# Patient Record
Sex: Female | Born: 1955 | Race: White | Hispanic: No | Smoking: Never smoker
Health system: Southern US, Community
[De-identification: ages and names within clinical notes are randomized; demographics above are authoritative.]

## PROBLEM LIST (undated history)

## (undated) DIAGNOSIS — C20 Malignant neoplasm of rectum: Secondary | ICD-10-CM

## (undated) DIAGNOSIS — K56609 Unspecified intestinal obstruction, unspecified as to partial versus complete obstruction: Secondary | ICD-10-CM

## (undated) DIAGNOSIS — G709 Myoneural disorder, unspecified: Secondary | ICD-10-CM

## (undated) DIAGNOSIS — T7840XA Allergy, unspecified, initial encounter: Secondary | ICD-10-CM

## (undated) DIAGNOSIS — K219 Gastro-esophageal reflux disease without esophagitis: Secondary | ICD-10-CM

## (undated) HISTORY — PX: TUBAL LIGATION: SHX77

## (undated) HISTORY — DX: Allergy, unspecified, initial encounter: T78.40XA

---

## 2014-11-09 ENCOUNTER — Ambulatory Visit (INDEPENDENT_AMBULATORY_CARE_PROVIDER_SITE_OTHER): Payer: BLUE CROSS/BLUE SHIELD | Admitting: Family Medicine

## 2014-11-09 VITALS — BP 142/80 | HR 67 | Temp 98.1°F | Resp 18 | Ht 64.0 in | Wt 169.0 lb

## 2014-11-09 DIAGNOSIS — M545 Low back pain, unspecified: Secondary | ICD-10-CM

## 2014-11-09 LAB — POC MICROSCOPIC URINALYSIS (UMFC): MUCUS RE: ABSENT

## 2014-11-09 LAB — POCT URINALYSIS DIP (MANUAL ENTRY)
BILIRUBIN UA: NEGATIVE
Glucose, UA: NEGATIVE
Ketones, POC UA: NEGATIVE
NITRITE UA: NEGATIVE
PH UA: 5
Protein Ur, POC: NEGATIVE
SPEC GRAV UA: 1.015
UROBILINOGEN UA: 0.2

## 2014-11-09 MED ORDER — PREDNISONE 20 MG PO TABS: ORAL_TABLET | ORAL | Status: DC

## 2014-11-09 NOTE — Patient Instructions (Signed)
The urine is normal.  You have a sprained back with some nerve root irritation.  Gentle range of motion, heat should help.  Usually this resolves in a month.  Ibuprofen before long car travel is a good idea.

## 2014-11-09 NOTE — Progress Notes (Signed)
@[facility] @  This chart was scribed for Robyn Haber, MD by Thea Alken, ED Scribe. This patient was seen in room 11 and the patient's care was started at 12:19 PM.  Patient ID: Cheyenne Scott MRN: [id number], DOB: Sep 13, 1955, 59 y.o. Date of Encounter: 11/09/2014, 12:18 PM  Primary Physician: No primary care provider on file.  Chief Complaint:  Chief Complaint  Patient presents with   Back Pain    off anf on a few days now    Urinary Tract Infection    was seen at urgent care 9/13 tx'ed with bactrim    Numbness    rt toes noticed 2 days ago     HPI: 59 y.o. year old female with history below presents with UTI x 2 weeks. Pt was diagnosed with a UTI at another facility and was prescribed  Penicillin, bactrim and received a flu vaccine. The following day after being seen she had a fever of 102 for 2 days. Half way through taking medications, she developed hives with improvement after taking benadryl. Pt has also had non radiating, low back pain. She initially thought the cause of back pain was due to flipping mattress 1 day prior. She treated pain with rest as well as avoiding stairs with improvement to pain. She reports a couple days later she stepped down off of a curb and had a sharp "tweak"  In low back after stepping off a curb.  She reports pain occurs suddenly as well as with certain movements. She has taken ibuprofen with relief  pain.   She also had numbness in her first 3 toes of right foot. Pain does not shoot from back. No sharp pain.     Past Medical History  Diagnosis Date   Allergy      Home Meds: Prior to Admission medications   Not on File    Allergies:  Allergies  Allergen Reactions   Penicillins Rash    Social History   Social History   Marital Status: Widowed    Spouse Name: N/A   Number of Children: N/A   Years of Education: N/A   Occupational History   Not on file.   Social History Main Topics   Smoking status: Never Smoker     Smokeless tobacco: Not on file   Alcohol Use: No   Drug Use: No   Sexual Activity: Not on file   Other Topics Concern   Not on file   Social History Narrative   No narrative on file    Review of Systems: Constitutional: negative for chills, fever, night sweats, weight changes, or fatigue  HEENT: negative for vision changes, hearing loss, congestion, rhinorrhea, ST, epistaxis, or sinus pressure Cardiovascular: negative for chest pain or palpitations Respiratory: negative for hemoptysis, wheezing, shortness of breath, or cough Abdominal: negative for abdominal pain, nausea, vomiting, diarrhea, or constipation Dermatological: negative for rash Neurologic: negative for headache, dizziness, or syncope All other systems reviewed and are otherwise negative with the exception to those above and in the HPI.   Physical Exam Blood pressure 142/80, pulse 67, temperature 98.1 F (36.7 C), temperature source Oral, resp. rate 18, height 5\' 4"  (1.626 m), weight 169 lb (76.658 kg), SpO2 99 %., Body mass index is 28.99 kg/(m^2). General: Well developed, well nourished, in no acute distress. Head: Normocephalic, atraumatic, eyes without discharge, sclera non-icteric, nares are without discharge. Bilateral auditory canals clear, TM's are without perforation, pearly grey and translucent with reflective cone of light bilaterally. Oral cavity moist, posterior  pharynx without exudate, erythema, peritonsillar abscess, or post nasal drip.  Neck: Supple. No thyromegaly. Full ROM. No lymphadenopathy. Lungs: Clear bilaterally to auscultation without wheezes, rales, or rhonchi. Breathing is unlabored. Heart: RRR with S1 S2. No murmurs, rubs, or gallops appreciated. Abdomen: Soft, non-tender, non-distended with normoactive bowel sounds. No hepatomegaly. No rebound/guarding. No obvious abdominal masses. Msk:  Strength and tone normal for age. Extremities/Skin: Warm and dry. No clubbing or cyanosis. No edema. No  rashes or suspicious lesions. Neuro: Alert and oriented X 3. Moves all extremities spontaneously. Gait is normal. CNII-XII grossly in tact. Back pain reproduced by left knee crossover. Negative straight leg raising. Mild tenderness when palpating the right lower lumbar paraspinal region Psych:  Responds to questions appropriately with a normal affect.   Labs: Results for orders placed or performed in visit on 11/09/14  POCT Microscopic Urinalysis ([facility])  Result Value Ref Range   WBC,UR,HPF,POC Few (A) None WBC/hpf   RBC,UR,HPF,POC None None RBC/hpf   Bacteria Few (A) None   Mucus Absent Absent   Epithelial Cells, UR Per Microscopy Few (A) None cells/hpf  POCT urinalysis dipstick  Result Value Ref Range   Color, UA yellow yellow   Clarity, UA clear clear   Glucose, UA negative negative   Bilirubin, UA negative negative   Ketones, POC UA negative negative   Spec Grav, UA 1.015    Blood, UA trace-intact (A) negative   pH, UA 5.0    Protein Ur, POC negative negative   Urobilinogen, UA 0.2    Nitrite, UA Negative Negative   Leukocytes, UA small (1+) (A) Negative   ASSESSMENT AND PLAN:  59 y.o. year old female with back pain most consistent with back strain This chart was scribed in my presence and reviewed by me personally.    ICD-9-CM ICD-10-CM   1. Right-sided low back pain without sciatica 724.2 M54.5 POCT Microscopic Urinalysis ([facility])     POCT urinalysis dipstick     predniSONE (DELTASONE) 20 MG tablet    By signing my name below, I, Raven Small, attest that this documentation has been prepared under the direction and in the presence of Robyn Haber, MD.  Electronically Signed: Thea Alken, ED Scribe. 11/09/2014. 12:32 PM.  Signed, Robyn Haber, MD 11/09/2014 12:18 PM

## 2015-01-07 ENCOUNTER — Other Ambulatory Visit: Payer: Self-pay | Admitting: Obstetrics and Gynecology

## 2015-01-10 ENCOUNTER — Encounter (HOSPITAL_COMMUNITY): Payer: Self-pay

## 2015-01-10 ENCOUNTER — Other Ambulatory Visit: Payer: Self-pay

## 2015-01-10 ENCOUNTER — Encounter (HOSPITAL_COMMUNITY)
Admission: RE | Admit: 2015-01-10 | Discharge: 2015-01-10 | Disposition: A | Discharge status: Home / Self Care | Payer: BLUE CROSS/BLUE SHIELD | Source: Ambulatory Visit | Attending: Obstetrics and Gynecology | Admitting: Obstetrics and Gynecology

## 2015-01-10 DIAGNOSIS — N814 Uterovaginal prolapse, unspecified: Secondary | ICD-10-CM | POA: Diagnosis not present

## 2015-01-10 DIAGNOSIS — N8110 Cystocele, unspecified: Secondary | ICD-10-CM | POA: Diagnosis not present

## 2015-01-10 DIAGNOSIS — Z01818 Encounter for other preprocedural examination: Secondary | ICD-10-CM | POA: Diagnosis not present

## 2015-01-10 LAB — BASIC METABOLIC PANEL
ANION GAP: 7 (ref 5–15)
BUN: 17 mg/dL (ref 6–20)
CO2: 28 mmol/L (ref 22–32)
Calcium: 9.5 mg/dL (ref 8.9–10.3)
Chloride: 105 mmol/L (ref 101–111)
Creatinine, Ser: 0.79 mg/dL (ref 0.44–1.00)
Glucose, Bld: 94 mg/dL (ref 65–99)
POTASSIUM: 3.5 mmol/L (ref 3.5–5.1)
SODIUM: 140 mmol/L (ref 135–145)

## 2015-01-10 LAB — CBC
HEMATOCRIT: 40.8 % (ref 36.0–46.0)
HEMOGLOBIN: 13.5 g/dL (ref 12.0–15.0)
MCH: 29.9 pg (ref 26.0–34.0)
MCHC: 33.1 g/dL (ref 30.0–36.0)
MCV: 90.5 fL (ref 78.0–100.0)
Platelets: 284 10*3/uL (ref 150–400)
RBC: 4.51 MIL/uL (ref 3.87–5.11)
RDW: 12.9 % (ref 11.5–15.5)
WBC: 6.3 10*3/uL (ref 4.0–10.5)

## 2015-01-10 LAB — TYPE AND SCREEN
ABO/RH(D): B POS
ANTIBODY SCREEN: NEGATIVE

## 2015-01-10 LAB — ABO/RH: ABO/RH(D): B POS

## 2015-01-10 NOTE — Patient Instructions (Addendum)
   Your procedure is scheduled on: DEC 12 (MONDAY)  Enter through the Main Entrance of [facility] at: [place] up the phone at the desk and dial [phone] and inform us of your arrival.  Please call this number if you have any problems the morning of surgery: [phone]  Remember: Do not eat food after midnight:  DEC 11 (SUNDAY) Do not drink clear liquids after: 9AM DAY OF SURGERY   Do not wear jewelry, make-up, or FINGER nail polish No metal in your hair or on your body. Do not wear lotions, powders, perfumes.  You may wear deodorant.  Do not bring valuables to the hospital. Contacts, dentures or bridgework may not be worn into surgery.  Leave suitcase in the car. After Surgery it may be brought to your room. For patients being admitted to the hospital, checkout time is 11:00am the day of discharge.

## 2015-01-15 ENCOUNTER — Ambulatory Visit (INDEPENDENT_AMBULATORY_CARE_PROVIDER_SITE_OTHER): Payer: BLUE CROSS/BLUE SHIELD | Admitting: Family Medicine

## 2015-01-15 VITALS — BP 126/84 | HR 86 | Temp 98.1°F | Resp 18 | Ht 64.0 in | Wt 170.0 lb

## 2015-01-15 DIAGNOSIS — J069 Acute upper respiratory infection, unspecified: Secondary | ICD-10-CM

## 2015-01-15 DIAGNOSIS — R300 Dysuria: Secondary | ICD-10-CM

## 2015-01-15 DIAGNOSIS — J029 Acute pharyngitis, unspecified: Secondary | ICD-10-CM | POA: Diagnosis not present

## 2015-01-15 DIAGNOSIS — N814 Uterovaginal prolapse, unspecified: Secondary | ICD-10-CM

## 2015-01-15 LAB — POCT CBC
GRANULOCYTE PERCENT: 67.1 % (ref 37–80)
HEMATOCRIT: 40.8 % (ref 37.7–47.9)
Hemoglobin: 13.8 g/dL (ref 12.2–16.2)
Lymph, poc: 1.8 (ref 0.6–3.4)
MCH: 30.2 pg (ref 27–31.2)
MCHC: 34 g/dL (ref 31.8–35.4)
MCV: 88.9 fL (ref 80–97)
MID (CBC): 0.6 (ref 0–0.9)
MPV: 8.6 fL (ref 0–99.8)
PLATELET COUNT, POC: 240 10*3/uL (ref 142–424)
POC GRANULOCYTE: 5 (ref 2–6.9)
POC LYMPH %: 24.8 % (ref 10–50)
POC MID %: 8.1 %M (ref 0–12)
RBC: 4.58 M/uL (ref 4.04–5.48)
RDW, POC: 13.6 %
WBC: 7.4 10*3/uL (ref 4.6–10.2)

## 2015-01-15 LAB — POCT RAPID STREP A (OFFICE): RAPID STREP A SCREEN: NEGATIVE

## 2015-01-15 LAB — POC MICROSCOPIC URINALYSIS (UMFC): Mucus: ABSENT

## 2015-01-15 LAB — POCT URINALYSIS DIP (MANUAL ENTRY)
BILIRUBIN UA: NEGATIVE
BILIRUBIN UA: NEGATIVE
GLUCOSE UA: NEGATIVE
Leukocytes, UA: NEGATIVE
NITRITE UA: NEGATIVE
Protein Ur, POC: NEGATIVE
Spec Grav, UA: <=1.005
Urobilinogen, UA: 0.2
pH, UA: 6

## 2015-01-15 MED ORDER — PSEUDOEPHEDRINE HCL ER 120 MG PO TB12: 120.0000 mg | ORAL_TABLET | Freq: Two times a day (BID) | ORAL | Status: DC | PRN

## 2015-01-15 NOTE — Progress Notes (Addendum)
Subjective:    Patient ID: Cheyenne Scott, female    DOB: 1955-04-28, 59 y.o.   MRN: [id number]  01/15/2015  Fever; Sore Throat; Abdominal Cramping; and Back Pain   HPI This 59 y.o. female presents for evaluation of fever, sore throat, abdominal pain, back pain.  Started with chills, sore throat started last night. Slight fever last night; low grade Tmax 99.9.  Prolapsed uterus and bladder; scheduled for hysterectomy in five days.  Worried about bladder infection; was very uncomfortable but this is a chronic issue for several months; has suprapubic fullness at all times; also suffers with chronic lower back pain; no worsening in past 24 hours.  Has undergone evaluation with negative work up.  +HA last night.  S+T diffuse; +PND; hot lemon today; some pain with swallowing.  Drank a lot of water.  No ear pain.  +rhinorrhea; +nasal congestion.  Worried about getting into chest.  Not sure what to take.  No cough.  No tobacco.  Clearing of throat.  No SOB.  No n/v/d.  No medication for congestion.   Has Flonase at home.  No nasal saline.   Review of Systems  Constitutional: Positive for fever, chills, diaphoresis and fatigue.  HENT: Positive for congestion, postnasal drip, rhinorrhea and sore throat. Negative for ear pain, mouth sores, sinus pressure, trouble swallowing and voice change.   Respiratory: Negative for cough and shortness of breath.   Gastrointestinal: Negative for nausea, vomiting, abdominal pain and diarrhea.  Genitourinary: Positive for urgency and frequency. Negative for dysuria, hematuria, flank pain, decreased urine volume, vaginal bleeding, vaginal discharge, enuresis, genital sores, vaginal pain and pelvic pain.  Neurological: Positive for headaches. Negative for dizziness.    Past Medical History  Diagnosis Date  . Allergy    Past Surgical History  Procedure Laterality Date  . Tubal ligation     Allergies  Allergen Reactions  . Penicillins Rash    Childhood reaction    Current Outpatient Prescriptions  Medication Sig Dispense Refill  . conjugated estrogens (PREMARIN) vaginal cream Place 1 Applicatorful vaginally at bedtime.    . Multiple Vitamin (MULTIVITAMIN WITH MINERALS) TABS tablet Take 1 tablet by mouth daily.    . meloxicam (MOBIC) 7.5 MG tablet Take 7.5 mg by mouth daily.    . predniSONE (DELTASONE) 20 MG tablet Two daily with food (Patient not taking: Reported on 01/09/2015) 10 tablet 0  . pseudoephedrine (SUDAFED) 120 MG 12 hr tablet Take 1 tablet (120 mg total) by mouth every 12 (twelve) hours as needed for congestion. 20 tablet 0   No current facility-administered medications for this visit.   Social History   Social History  . Marital Status: Widowed    Spouse Name: N/A  . Number of Children: N/A  . Years of Education: N/A   Occupational History  . Not on file.   Social History Main Topics  . Smoking status: Never Smoker   . Smokeless tobacco: Not on file  . Alcohol Use: No  . Drug Use: No  . Sexual Activity: Not on file   Other Topics Concern  . Not on file   Social History Narrative   Family History  Problem Relation Age of Onset  . Diabetes Mother   . Heart disease Mother   . Cancer Brother   . Stroke Maternal Grandmother        Objective:    BP 126/84 mmHg  Pulse 86  Temp(Src) 98.1 F (36.7 C) (Oral)  Resp 18  Ht 5'  4" (1.626 m)  Wt 170 lb (77.111 kg)  BMI 29.17 kg/m2  SpO2 96% Physical Exam  Constitutional: She is oriented to person, place, and time. She appears well-developed and well-nourished. No distress.  HENT:  Head: Normocephalic and atraumatic.  Right Ear: Tympanic membrane, external ear and ear canal normal.  Left Ear: Tympanic membrane, external ear and ear canal normal.  Nose: Nose normal.  Mouth/Throat: Uvula is midline and mucous membranes are normal. Posterior oropharyngeal erythema present. No oropharyngeal exudate, posterior oropharyngeal edema or tonsillar abscesses.  Eyes:  Conjunctivae and EOM are normal. Pupils are equal, round, and reactive to light.  Neck: Normal range of motion. Neck supple. Carotid bruit is not present. No thyromegaly present.  Cardiovascular: Normal rate, regular rhythm, normal heart sounds and intact distal pulses.  Exam reveals no gallop and no friction rub.   No murmur heard. Pulmonary/Chest: Effort normal and breath sounds normal. She has no wheezes. She has no rales.  Abdominal: Soft. Bowel sounds are normal. She exhibits no distension and no mass. There is no tenderness. There is no rebound and no guarding.  Lymphadenopathy:    She has no cervical adenopathy.  Neurological: She is alert and oriented to person, place, and time. No cranial nerve deficit.  Skin: Skin is warm and dry. No rash noted. She is not diaphoretic. No erythema. No pallor.  Psychiatric: She has a normal mood and affect. Her behavior is normal.   Results for orders placed or performed in visit on 01/15/15  POCT urinalysis dipstick  Result Value Ref Range   Color, UA light yellow (A) yellow   Clarity, UA clear clear   Glucose, UA negative negative   Bilirubin, UA negative negative   Ketones, POC UA negative negative   Spec Grav, UA <=1.005    Blood, UA trace-lysed (A) negative   pH, UA 6.0    Protein Ur, POC negative negative   Urobilinogen, UA 0.2    Nitrite, UA Negative Negative   Leukocytes, UA Negative Negative  POCT Microscopic Urinalysis ([facility])  Result Value Ref Range   WBC,UR,HPF,POC None None WBC/hpf   RBC,UR,HPF,POC None None RBC/hpf   Bacteria None None, Too numerous to count   Mucus Absent Absent   Epithelial Cells, UR Per Microscopy Few (A) None, Too numerous to count cells/hpf  POCT rapid strep A  Result Value Ref Range   Rapid Strep A Screen Negative Negative  POCT CBC  Result Value Ref Range   WBC 7.4 4.6 - 10.2 K/uL   Lymph, poc 1.8 0.6 - 3.4   POC LYMPH PERCENT 24.8 10 - 50 %L   MID (cbc) 0.6 0 - 0.9   POC MID % 8.1 0 - 12 %M    POC Granulocyte 5.0 2 - 6.9   Granulocyte percent 67.1 37 - 80 %G   RBC 4.58 4.04 - 5.48 M/uL   Hemoglobin 13.8 12.2 - 16.2 g/dL   HCT, POC 40.8 37.7 - 47.9 %   MCV 88.9 80 - 97 fL   MCH, POC 30.2 27 - 31.2 pg   MCHC 34.0 31.8 - 35.4 g/dL   RDW, POC 13.6 %   Platelet Count, POC 240 142 - 424 K/uL   MPV 8.6 0 - 99.8 fL       Assessment & Plan:   1. Acute upper respiratory infection   2. Burning with urination   3. Sore throat   4. Uterine prolapse    -New.  Consistent with viral syndrome.  Supportive care with rest, Tylenol, Pseudoephedrine, Flonase, nasal saline.  RTC for acute worsening. -Send throat culture.   -Send urine culture; no evidence of UTI. -Call if no improvement in four days.  Orders Placed This Encounter  Procedures  . Urine culture  . Culture, Group A Strep  . POCT urinalysis dipstick  . POCT Microscopic Urinalysis ([facility])  . POCT rapid strep A  . POCT CBC   Meds ordered this encounter  Medications  . pseudoephedrine (SUDAFED) 120 MG 12 hr tablet    Sig: Take 1 tablet (120 mg total) by mouth every 12 (twelve) hours as needed for congestion.    Dispense:  20 tablet    Refill:  0    No Follow-up on file.    Lenea Bywater Elayne Guerin, M.D. [facility] [address] [place], [place]  [phone] phone [phone] fax

## 2015-01-15 NOTE — Patient Instructions (Signed)
Upper Respiratory Infection, Adult Most upper respiratory infections (URIs) are a viral infection of the air passages leading to the lungs. A URI affects the nose, throat, and upper air passages. The most common type of URI is nasopharyngitis and is typically referred to as "the common cold." URIs run their course and usually go away on their own. Most of the time, a URI does not require medical attention, but sometimes a bacterial infection in the upper airways can follow a viral infection. This is called a secondary infection. Sinus and middle ear infections are common types of secondary upper respiratory infections. Bacterial pneumonia can also complicate a URI. A URI can worsen asthma and chronic obstructive pulmonary disease (COPD). Sometimes, these complications can require emergency medical care and may be life threatening.  CAUSES Almost all URIs are caused by viruses. A virus is a type of germ and can spread from one person to another.  RISKS FACTORS You may be at risk for a URI if:   You smoke.   You have chronic heart or lung disease.  You have a weakened defense (immune) system.   You are very young or very old.   You have nasal allergies or asthma.  You work in crowded or poorly ventilated areas.  You work in health care facilities or schools. SIGNS AND SYMPTOMS  Symptoms typically develop 2-3 days after you come in contact with a cold virus. Most viral URIs last 7-10 days. However, viral URIs from the influenza virus (flu virus) can last 14-18 days and are typically more severe. Symptoms may include:   Runny or stuffy (congested) nose.   Sneezing.   Cough.   Sore throat.   Headache.   Fatigue.   Fever.   Loss of appetite.   Pain in your forehead, behind your eyes, and over your cheekbones (sinus pain).  Muscle aches.  DIAGNOSIS  Your health care provider may diagnose a URI by:  Physical exam.  Tests to check that your symptoms are not due to  another condition such as:  Strep throat.  Sinusitis.  Pneumonia.  Asthma. TREATMENT  A URI goes away on its own with time. It cannot be cured with medicines, but medicines may be prescribed or recommended to relieve symptoms. Medicines may help:  Reduce your fever.  Reduce your cough.  Relieve nasal congestion. HOME CARE INSTRUCTIONS   Take medicines only as directed by your health care provider.   Gargle warm saltwater or take cough drops to comfort your throat as directed by your health care provider.  Use a warm mist humidifier or inhale steam from a shower to increase air moisture. This may make it easier to breathe.  Drink enough fluid to keep your urine clear or pale yellow.   Eat soups and other clear broths and maintain good nutrition.   Rest as needed.   Return to work when your temperature has returned to normal or as your health care provider advises. You may need to stay home longer to avoid infecting others. You can also use a face mask and careful hand washing to prevent spread of the virus.  Increase the usage of your inhaler if you have asthma.   Do not use any tobacco products, including cigarettes, chewing tobacco, or electronic cigarettes. If you need help quitting, ask your health care provider. PREVENTION  The best way to protect yourself from getting a cold is to practice good hygiene.   Avoid oral or hand contact with people with cold   symptoms.   Wash your hands often if contact occurs.  There is no clear evidence that vitamin C, vitamin E, echinacea, or exercise reduces the chance of developing a cold. However, it is always recommended to get plenty of rest, exercise, and practice good nutrition.  SEEK MEDICAL CARE IF:   You are getting worse rather than better.   Your symptoms are not controlled by medicine.   You have chills.  You have worsening shortness of breath.  You have brown or red mucus.  You have yellow or brown nasal  discharge.  You have pain in your face, especially when you bend forward.  You have a fever.  You have swollen neck glands.  You have pain while swallowing.  You have white areas in the back of your throat. SEEK IMMEDIATE MEDICAL CARE IF:   You have severe or persistent:  Headache.  Ear pain.  Sinus pain.  Chest pain.  You have chronic lung disease and any of the following:  Wheezing.  Prolonged cough.  Coughing up blood.  A change in your usual mucus.  You have a stiff neck.  You have changes in your:  Vision.  Hearing.  Thinking.  Mood. MAKE SURE YOU:   Understand these instructions.  Will watch your condition.  Will get help right away if you are not doing well or get worse.   This information is not intended to replace advice given to you by your health care provider. Make sure you discuss any questions you have with your health care provider.   Document Released: 07/21/2000 Document Revised: 06/11/2014 Document Reviewed: 05/02/2013 Elsevier Interactive Patient Education 2016 [employer]  

## 2015-01-16 LAB — URINE CULTURE

## 2015-01-17 LAB — CULTURE, GROUP A STREP: ORGANISM ID, BACTERIA: NORMAL

## 2015-01-19 NOTE — H&P (Signed)
NAME:  Cheyenne Scott, MAINWARING NO.:  [id number]  MEDICAL RECORD NO.:  [id number]  LOCATION:  PERIO                         FACILITY:  [facility]  PHYSICIAN:  Lovenia Kim, M.D.DATE OF BIRTH:  08-25-1955  DATE OF ADMISSION:  12/31/2014 DATE OF DISCHARGE:                             HISTORY & PHYSICAL   CHIEF COMPLAINT:  Symptomatic pelvic relaxation.  HISTORY OF PRESENT ILLNESS:  She is a 59 year old white female, G3, P3, with symptomatic cystocele, rectocele, and anterior vaginal vault prolapse.  Patient declines any placement of mesh.  We will proceed with definitive therapy and an uterosacral ligament suspension.  ALLERGIES:  She has no known allergies.  MEDICATIONS:  Premarin cream and multivitamin.  FAMILY HISTORY:  She has a family history of diabetes, heart disease, hypertension.  OBSTETRIC HISTORY:  History of vaginal delivery x3, history of laparoscopic tubal ligation, history of breast biopsy which was benign.  PHYSICAL EXAMINATION:  GENERAL:  She is a well-developed, well-nourished white female, in no acute distress. HEENT:  Normal. NECK:  Supple.  Full range of motion. LUNGS:  Clear to auscultation. ABDOMEN:  Soft, nontender.  No CVA tenderness. EXTREMITIES:  There are no cords. NEUROLOGIC:  Nonfocal. SKIN:  Intact.  IMPRESSION:  Symptomatic pelvic relaxation with apical anterior wall and posterior wall prolapse.  PLAN:  To proceed with da Vinci-assisted total laparoscopic hysterectomy, bilateral salpingectomy, uterosacral ligament suspension, anterior and posterior colporrhaphy with perineorrhaphy.  Risks of anesthesia, infection, bleeding, injury to surrounding organs, and possible need for repair was discussed.  Delayed versus immediate complications to include bowel and bladder injury are noted.  The patient acknowledges and wishes to proceed.     Lovenia Kim, M.D.     RJT/MEDQ  D:  01/19/2015  T:  01/19/2015  Job:  [id number]

## 2015-01-20 ENCOUNTER — Encounter (HOSPITAL_COMMUNITY): Payer: Self-pay

## 2015-01-20 ENCOUNTER — Encounter (HOSPITAL_COMMUNITY)
Admission: RE | Disposition: A | Discharge status: Home / Self Care | Payer: Self-pay | Source: Ambulatory Visit | Attending: Obstetrics and Gynecology

## 2015-01-20 ENCOUNTER — Ambulatory Visit (HOSPITAL_COMMUNITY)
Admission: RE | Admit: 2015-01-20 | Discharge: 2015-01-21 | Disposition: A | Discharge status: Home / Self Care | Payer: BLUE CROSS/BLUE SHIELD | Source: Ambulatory Visit | Attending: Obstetrics and Gynecology | Admitting: Obstetrics and Gynecology

## 2015-01-20 ENCOUNTER — Ambulatory Visit (HOSPITAL_COMMUNITY): Payer: BLUE CROSS/BLUE SHIELD | Admitting: Anesthesiology

## 2015-01-20 DIAGNOSIS — N814 Uterovaginal prolapse, unspecified: Secondary | ICD-10-CM | POA: Diagnosis present

## 2015-01-20 DIAGNOSIS — N8189 Other female genital prolapse: Secondary | ICD-10-CM | POA: Diagnosis present

## 2015-01-20 HISTORY — PX: ROBOTIC ASSISTED TOTAL HYSTERECTOMY WITH SALPINGECTOMY: SHX6679

## 2015-01-20 HISTORY — PX: ANTERIOR AND POSTERIOR REPAIR: SHX5121

## 2015-01-20 SURGERY — ROBOTIC ASSISTED TOTAL HYSTERECTOMY WITH SALPINGECTOMY
Anesthesia: General | Site: Vagina

## 2015-01-20 MED ORDER — PROPOFOL 10 MG/ML IV BOLUS
INTRAVENOUS | Status: AC
Start: 2015-01-20 — End: 2015-01-20
  Filled 2015-01-20: qty 20

## 2015-01-20 MED ORDER — GLYCOPYRROLATE 0.2 MG/ML IJ SOLN
INTRAMUSCULAR | Status: DC | PRN
  Administered 2015-01-20 (×2): 0.1 mg via INTRAVENOUS
  Administered 2015-01-20: 0.6 mg via INTRAVENOUS

## 2015-01-20 MED ORDER — VASOPRESSIN 20 UNIT/ML IV SOLN
INTRAVENOUS | Status: DC | PRN
  Administered 2015-01-20: 50 mL via INTRAMUSCULAR

## 2015-01-20 MED ORDER — SODIUM CHLORIDE 0.9 % IR SOLN
Status: DC | PRN
  Administered 2015-01-20: 3000 mL

## 2015-01-20 MED ORDER — PROPOFOL 10 MG/ML IV BOLUS
INTRAVENOUS | Status: DC | PRN
  Administered 2015-01-20: 150 mg via INTRAVENOUS

## 2015-01-20 MED ORDER — FENTANYL CITRATE (PF) 100 MCG/2ML IJ SOLN
INTRAMUSCULAR | Status: AC
  Filled 2015-01-20: qty 2

## 2015-01-20 MED ORDER — MIDAZOLAM HCL 2 MG/2ML IJ SOLN
INTRAMUSCULAR | Status: DC | PRN
  Administered 2015-01-20: 1.5 mg via INTRAVENOUS
  Administered 2015-01-20: 0.5 mg via INTRAVENOUS

## 2015-01-20 MED ORDER — BUPIVACAINE HCL (PF) 0.25 % IJ SOLN
INTRAMUSCULAR | Status: AC
  Filled 2015-01-20: qty 30

## 2015-01-20 MED ORDER — CEFAZOLIN SODIUM-DEXTROSE 2-3 GM-% IV SOLR
2.0000 g | INTRAVENOUS | Status: AC
  Administered 2015-01-20: 2 g via INTRAVENOUS

## 2015-01-20 MED ORDER — DEXTROSE IN LACTATED RINGERS 5 % IV SOLN
INTRAVENOUS | Status: DC
  Administered 2015-01-20 – 2015-01-21 (×2): via INTRAVENOUS

## 2015-01-20 MED ORDER — ESTRADIOL 0.1 MG/GM VA CREA
TOPICAL_CREAM | VAGINAL | Status: AC
  Filled 2015-01-20: qty 42.5

## 2015-01-20 MED ORDER — ROPIVACAINE HCL 5 MG/ML IJ SOLN
INTRAMUSCULAR | Status: AC
Start: 2015-01-20 — End: 2015-01-20
  Filled 2015-01-20: qty 60

## 2015-01-20 MED ORDER — HYDROMORPHONE HCL 1 MG/ML IJ SOLN
INTRAMUSCULAR | Status: AC
  Filled 2015-01-20: qty 1

## 2015-01-20 MED ORDER — ROCURONIUM BROMIDE 100 MG/10ML IV SOLN
INTRAVENOUS | Status: DC | PRN
  Administered 2015-01-20: 50 mg via INTRAVENOUS

## 2015-01-20 MED ORDER — FENTANYL CITRATE (PF) 100 MCG/2ML IJ SOLN
INTRAMUSCULAR | Status: DC | PRN
  Administered 2015-01-20 (×2): 50 ug via INTRAVENOUS

## 2015-01-20 MED ORDER — GLYCOPYRROLATE 0.2 MG/ML IJ SOLN
INTRAMUSCULAR | Status: AC
  Filled 2015-01-20: qty 3

## 2015-01-20 MED ORDER — ONDANSETRON HCL 4 MG/2ML IJ SOLN
INTRAMUSCULAR | Status: DC | PRN
  Administered 2015-01-20 (×2): 2 mg via INTRAVENOUS

## 2015-01-20 MED ORDER — CEFAZOLIN SODIUM-DEXTROSE 2-3 GM-% IV SOLR
INTRAVENOUS | Status: AC
  Filled 2015-01-20: qty 50

## 2015-01-20 MED ORDER — HYDROMORPHONE HCL 1 MG/ML IJ SOLN
INTRAMUSCULAR | Status: DC | PRN
  Administered 2015-01-20: 1 mg via INTRAVENOUS

## 2015-01-20 MED ORDER — LIDOCAINE HCL (CARDIAC) 20 MG/ML IV SOLN
INTRAVENOUS | Status: AC
  Filled 2015-01-20: qty 5

## 2015-01-20 MED ORDER — HYDROMORPHONE 1 MG/ML IV SOLN
INTRAVENOUS | Status: DC
  Administered 2015-01-20: 0.4 mg via INTRAVENOUS
  Administered 2015-01-20: 19:00:00 via INTRAVENOUS
  Administered 2015-01-21 (×3): 0.4 mg via INTRAVENOUS
  Filled 2015-01-20: qty 25

## 2015-01-20 MED ORDER — FENTANYL CITRATE (PF) 100 MCG/2ML IJ SOLN
25.0000 ug | INTRAMUSCULAR | Status: DC | PRN
  Administered 2015-01-20 (×2): 25 ug via INTRAVENOUS
  Administered 2015-01-20: 50 ug via INTRAVENOUS

## 2015-01-20 MED ORDER — LIDOCAINE HCL (CARDIAC) 20 MG/ML IV SOLN
INTRAVENOUS | Status: DC | PRN
  Administered 2015-01-20: 100 mg via INTRAVENOUS

## 2015-01-20 MED ORDER — FENTANYL CITRATE (PF) 100 MCG/2ML IJ SOLN
INTRAMUSCULAR | Status: AC
  Administered 2015-01-20: 50 ug via INTRAVENOUS
  Filled 2015-01-20: qty 2

## 2015-01-20 MED ORDER — ONDANSETRON HCL 4 MG/2ML IJ SOLN
4.0000 mg | Freq: Four times a day (QID) | INTRAMUSCULAR | Status: DC | PRN
Start: 2015-01-20 — End: 2015-01-21

## 2015-01-20 MED ORDER — NEOSTIGMINE METHYLSULFATE 10 MG/10ML IV SOLN
INTRAVENOUS | Status: DC | PRN
  Administered 2015-01-20: 4 mg via INTRAVENOUS

## 2015-01-20 MED ORDER — KETOROLAC TROMETHAMINE 30 MG/ML IJ SOLN
INTRAMUSCULAR | Status: AC
  Filled 2015-01-20: qty 1

## 2015-01-20 MED ORDER — SODIUM CHLORIDE 0.9 % IJ SOLN
INTRAMUSCULAR | Status: AC
  Filled 2015-01-20: qty 150

## 2015-01-20 MED ORDER — LACTATED RINGERS IV SOLN
INTRAVENOUS | Status: DC
  Administered 2015-01-20: 15:00:00 via INTRAVENOUS
  Administered 2015-01-20 (×2): 75 mL/h via INTRAVENOUS

## 2015-01-20 MED ORDER — SODIUM CHLORIDE 0.9 % IV SOLN
INTRAVENOUS | Status: DC | PRN
  Administered 2015-01-20: 120 mL

## 2015-01-20 MED ORDER — FENTANYL CITRATE (PF) 100 MCG/2ML IJ SOLN: 25.0000 ug | INTRAMUSCULAR | Status: DC | PRN

## 2015-01-20 MED ORDER — DEXAMETHASONE SODIUM PHOSPHATE 10 MG/ML IJ SOLN
INTRAMUSCULAR | Status: DC | PRN
  Administered 2015-01-20: 5 mg via INTRAVENOUS

## 2015-01-20 MED ORDER — BUPIVACAINE HCL (PF) 0.25 % IJ SOLN
INTRAMUSCULAR | Status: DC | PRN
  Administered 2015-01-20: 14 mL

## 2015-01-20 MED ORDER — ONDANSETRON HCL 4 MG/2ML IJ SOLN
4.0000 mg | Freq: Once | INTRAMUSCULAR | Status: DC | PRN
Start: 2015-01-20 — End: 2015-01-20

## 2015-01-20 MED ORDER — MIDAZOLAM HCL 2 MG/2ML IJ SOLN
INTRAMUSCULAR | Status: AC
  Filled 2015-01-20: qty 2

## 2015-01-20 MED ORDER — VASOPRESSIN 20 UNIT/ML IV SOLN
INTRAVENOUS | Status: AC
  Filled 2015-01-20: qty 1

## 2015-01-20 MED ORDER — SCOPOLAMINE 1 MG/3DAYS TD PT72
1.0000 | MEDICATED_PATCH | Freq: Once | TRANSDERMAL | Status: DC
  Administered 2015-01-20: 1.5 mg via TRANSDERMAL

## 2015-01-20 MED ORDER — OXYCODONE-ACETAMINOPHEN 5-325 MG PO TABS
1.0000 | ORAL_TABLET | ORAL | Status: DC | PRN
  Administered 2015-01-21: 1 via ORAL
  Filled 2015-01-20: qty 1

## 2015-01-20 MED ORDER — DIPHENHYDRAMINE HCL 12.5 MG/5ML PO ELIX: 12.5000 mg | ORAL_SOLUTION | Freq: Four times a day (QID) | ORAL | Status: DC | PRN

## 2015-01-20 MED ORDER — NALOXONE HCL 0.4 MG/ML IJ SOLN: 0.4000 mg | INTRAMUSCULAR | Status: DC | PRN

## 2015-01-20 MED ORDER — NEOSTIGMINE METHYLSULFATE 10 MG/10ML IV SOLN
INTRAVENOUS | Status: AC
  Filled 2015-01-20: qty 1

## 2015-01-20 MED ORDER — ACETAMINOPHEN 10 MG/ML IV SOLN
1000.0000 mg | Freq: Once | INTRAVENOUS | Status: AC
  Administered 2015-01-20: 1000 mg via INTRAVENOUS
  Filled 2015-01-20: qty 100

## 2015-01-20 MED ORDER — ONDANSETRON HCL 4 MG/2ML IJ SOLN
INTRAMUSCULAR | Status: AC
  Filled 2015-01-20: qty 2

## 2015-01-20 MED ORDER — DEXAMETHASONE SODIUM PHOSPHATE 10 MG/ML IJ SOLN
INTRAMUSCULAR | Status: AC
  Filled 2015-01-20: qty 1

## 2015-01-20 MED ORDER — KETOROLAC TROMETHAMINE 30 MG/ML IJ SOLN
INTRAMUSCULAR | Status: DC | PRN
  Administered 2015-01-20: 30 mg via INTRAVENOUS

## 2015-01-20 MED ORDER — SODIUM CHLORIDE 0.9 % IJ SOLN: 9.0000 mL | INTRAMUSCULAR | Status: DC | PRN

## 2015-01-20 MED ORDER — ROCURONIUM BROMIDE 100 MG/10ML IV SOLN
INTRAVENOUS | Status: AC
  Filled 2015-01-20: qty 1

## 2015-01-20 MED ORDER — BUPIVACAINE LIPOSOME 1.3 % IJ SUSP
20.0000 mL | Freq: Once | INTRAMUSCULAR | Status: DC
  Filled 2015-01-20: qty 20

## 2015-01-20 MED ORDER — SCOPOLAMINE 1 MG/3DAYS TD PT72
MEDICATED_PATCH | TRANSDERMAL | Status: AC
Start: 2015-01-20 — End: 2015-01-21
  Filled 2015-01-20: qty 1

## 2015-01-20 MED ORDER — DIPHENHYDRAMINE HCL 50 MG/ML IJ SOLN: 12.5000 mg | Freq: Four times a day (QID) | INTRAMUSCULAR | Status: DC | PRN

## 2015-01-20 MED ORDER — TRAMADOL HCL 50 MG PO TABS: 50.0000 mg | ORAL_TABLET | Freq: Four times a day (QID) | ORAL | Status: DC | PRN

## 2015-01-20 SURGICAL SUPPLY — 71 items
BAG URINE DRAINAGE (UROLOGICAL SUPPLIES) ×3 IMPLANT
BARRIER ADHS 3X4 INTERCEED (GAUZE/BANDAGES/DRESSINGS) ×3 IMPLANT
CATH FOLEY 3WAY  5CC 16FR (CATHETERS) ×1
CATH FOLEY 3WAY 5CC 16FR (CATHETERS) ×2 IMPLANT
CELL SAVER LIPIGURD (MISCELLANEOUS) IMPLANT
CLOTH BEACON ORANGE TIMEOUT ST (SAFETY) ×3 IMPLANT
CONT PATH 16OZ SNAP LID 3702 (MISCELLANEOUS) ×3 IMPLANT
COVER BACK TABLE 60X90IN (DRAPES) ×6 IMPLANT
COVER TIP SHEARS 8 DVNC (MISCELLANEOUS) ×2 IMPLANT
COVER TIP SHEARS 8MM DA VINCI (MISCELLANEOUS) ×1
DECANTER SPIKE VIAL GLASS SM (MISCELLANEOUS) ×12 IMPLANT
DEFOGGER SCOPE WARMER CLEARIFY (MISCELLANEOUS) ×3 IMPLANT
DRSG OPSITE POSTOP 3X4 (GAUZE/BANDAGES/DRESSINGS) ×3 IMPLANT
DURAPREP 26ML APPLICATOR (WOUND CARE) ×3 IMPLANT
ELECT NEEDLE TIP 2.8 STRL (NEEDLE) IMPLANT
ELECT REM PT RETURN 9FT ADLT (ELECTROSURGICAL) ×3
ELECTRODE REM PT RTRN 9FT ADLT (ELECTROSURGICAL) ×2 IMPLANT
EXTRT SYSTEM ALEXIS 14CM (MISCELLANEOUS)
GAUZE PACKING 1 X5 YD ST (GAUZE/BANDAGES/DRESSINGS) IMPLANT
GAUZE PACKING 2X5 YD STRL (GAUZE/BANDAGES/DRESSINGS) IMPLANT
GAUZE VASELINE 3X9 (GAUZE/BANDAGES/DRESSINGS) IMPLANT
GLOVE BIO SURGEON STRL SZ7.5 (GLOVE) ×6 IMPLANT
GLOVE BIOGEL PI IND STRL 7.0 (GLOVE) ×10 IMPLANT
GLOVE BIOGEL PI INDICATOR 7.0 (GLOVE) ×5
GOWN STRL REUS W/TWL LRG LVL3 (GOWN DISPOSABLE) ×12 IMPLANT
KIT ACCESSORY DA VINCI DISP (KITS) ×1
KIT ACCESSORY DVNC DISP (KITS) ×2 IMPLANT
LEGGING LITHOTOMY PAIR STRL (DRAPES) ×3 IMPLANT
LIQUID BAND (GAUZE/BANDAGES/DRESSINGS) ×3 IMPLANT
NEEDLE HYPO 22GX1.5 SAFETY (NEEDLE) IMPLANT
NEEDLE INSUFFLATION 150MM (ENDOMECHANICALS) ×3 IMPLANT
NS IRRIG 1000ML POUR BTL (IV SOLUTION) ×3 IMPLANT
OCCLUDER COLPOPNEUMO (BALLOONS) ×3 IMPLANT
PACK ROBOT WH (CUSTOM PROCEDURE TRAY) ×3 IMPLANT
PACK ROBOTIC GOWN (GOWN DISPOSABLE) ×3 IMPLANT
PACK VAGINAL WOMENS (CUSTOM PROCEDURE TRAY) ×3 IMPLANT
PAD POSITIONING PINK XL (MISCELLANEOUS) ×3 IMPLANT
PAD PREP 24X48 CUFFED NSTRL (MISCELLANEOUS) ×6 IMPLANT
PORT ACCESS TROCAR AIRSEAL 5 (TROCAR) ×3 IMPLANT
SET CYSTO W/LG BORE CLAMP LF (SET/KITS/TRAYS/PACK) IMPLANT
SET IRRIG TUBING LAPAROSCOPIC (IRRIGATION / IRRIGATOR) ×3 IMPLANT
SET TRI-LUMEN FLTR TB AIRSEAL (TUBING) ×3 IMPLANT
SLEEVE XCEL OPT CAN 5 100 (ENDOMECHANICALS) ×3 IMPLANT
SUT ETHIBOND 0 (SUTURE) ×3 IMPLANT
SUT MON AB 2-0 CT2 27 (SUTURE) IMPLANT
SUT VIC AB 0 CT1 27 (SUTURE) ×3
SUT VIC AB 0 CT1 27XBRD ANBCTR (SUTURE) ×6 IMPLANT
SUT VIC AB 2-0 CT1 27 (SUTURE)
SUT VIC AB 2-0 CT1 TAPERPNT 27 (SUTURE) IMPLANT
SUT VICRYL 0 UR6 27IN ABS (SUTURE) ×6 IMPLANT
SUT VICRYL 3 0 CT 3 (SUTURE) IMPLANT
SUT VICRYL RAPIDE 4/0 PS 2 (SUTURE) ×6 IMPLANT
SUT VLOC 180 0 9IN  GS21 (SUTURE) ×1
SUT VLOC 180 0 9IN GS21 (SUTURE) ×2 IMPLANT
SUT VLOC 180 2-0 9IN GS21 (SUTURE) ×9 IMPLANT
SYR 50ML LL SCALE MARK (SYRINGE) ×3 IMPLANT
SYRINGE 10CC LL (SYRINGE) ×3 IMPLANT
TIP RUMI ORANGE 6.7MMX12CM (TIP) IMPLANT
TIP UTERINE 5.1X6CM LAV DISP (MISCELLANEOUS) IMPLANT
TIP UTERINE 6.7X10CM GRN DISP (MISCELLANEOUS) IMPLANT
TIP UTERINE 6.7X6CM WHT DISP (MISCELLANEOUS) ×3 IMPLANT
TIP UTERINE 6.7X8CM BLUE DISP (MISCELLANEOUS) ×3 IMPLANT
TOWEL OR 17X24 6PK STRL BLUE (TOWEL DISPOSABLE) ×9 IMPLANT
TRAY FOLEY CATH SILVER 14FR (SET/KITS/TRAYS/PACK) IMPLANT
TROCAR DISP BLADELESS 8 DVNC (TROCAR) ×2 IMPLANT
TROCAR DISP BLADELESS 8MM (TROCAR) ×1
TROCAR OPTI TIP 5M 100M (ENDOMECHANICALS) ×3 IMPLANT
TROCAR PORT AIRSEAL 5X120 (TROCAR) ×3 IMPLANT
TROCAR XCEL 12X100 BLDLESS (ENDOMECHANICALS) IMPLANT
TROCAR Z-THREAD 12X150 (TROCAR) ×3 IMPLANT
WATER STERILE IRR 1000ML POUR (IV SOLUTION) ×3 IMPLANT

## 2015-01-20 NOTE — Progress Notes (Signed)
Patient ID: Cheyenne Scott, female   DOB: 1955-08-19, 59 y.o.   MRN: [id number] Patient seen and examined. Consent witnessed and signed. No changes noted. Update completed.

## 2015-01-20 NOTE — Progress Notes (Signed)
Patient verbalizes concern of possible signs of cancer noted during procedure.  Patient states husband had abdominal surgery after being ill with no none cause and was dx with cancer after surgeon visualized abdomen during procedure, died of cancer 6 months later.  Emotional support given to patient; encouraged communication with surgeon when rounding.

## 2015-01-20 NOTE — Anesthesia Procedure Notes (Signed)
Procedure Name: Intubation Date/Time: 01/20/2015 1:10 PM Performed by: Caya Soberanis A Pre-anesthesia Checklist: Patient identified, Emergency Drugs available, Suction available, Patient being monitored and Timeout performed Patient Re-evaluated:Patient Re-evaluated prior to inductionOxygen Delivery Method: Circle system utilized Preoxygenation: Pre-oxygenation with 100% oxygen Intubation Type: IV induction Ventilation: Mask ventilation without difficulty Laryngoscope Size: Miller and 2 Grade View: Grade I Tube type: Oral Tube size: 7.0 mm Number of attempts: 1 Placement Confirmation: ETT inserted through vocal cords under direct vision,  breath sounds checked- equal and bilateral and positive ETCO2 Secured at: 20 cm Tube secured with: Tape Dental Injury: Teeth and Oropharynx as per pre-operative assessment

## 2015-01-20 NOTE — Transfer of Care (Signed)
Immediate Anesthesia Transfer of Care Note  Patient: Cheyenne Scott  Procedure(s) Performed: Procedure(s): ROBOTIC ASSISTED TOTAL HYSTERECTOMY WITH BILATERAL SALPINGECTOMY (Bilateral) ANTERIOR (CYSTOCELE) AND POSTERIOR REPAIR (RECTOCELE) WITH PERINEORRHAPHY AND UTEROSACRAL LIGAMENT  SUSPENSION  (N/A)  Patient Location: PACU  Anesthesia Type:General  Level of Consciousness: awake, alert , oriented and patient cooperative  Airway & Oxygen Therapy: Patient Spontanous Breathing and Patient connected to nasal cannula oxygen  Post-op Assessment: Report given to RN and Post -op Vital signs reviewed and stable  Post vital signs: Reviewed and stable  Last Vitals:  Filed Vitals:   01/20/15 1157 01/20/15 1616  BP: 159/91   Pulse: 95 81  Temp: 36.7 C 36.6 C  Resp: 16     Complications: No apparent anesthesia complications

## 2015-01-20 NOTE — Anesthesia Preprocedure Evaluation (Addendum)
Anesthesia Evaluation  Patient identified by MRN, date of birth, ID band Patient awake    Reviewed: Allergy & Precautions, NPO status , Patient's Chart, lab work & pertinent test results  History of Anesthesia Complications (+) PONV and history of anesthetic complications  Airway Mallampati: II  TM Distance: >3 FB Neck ROM: Full    Dental  (+) Teeth Intact, Dental Advisory Given   Pulmonary Recent URI , Resolved,  Seen 12/7 for evaluation of cough, chills, low grade fever.  Tests for strep, flu negative.  Today, patient denies fever, chills, productive cough.  Feels she is getting better.   Pulmonary exam normal breath sounds clear to auscultation- rhonchi (-) decreased breath sounds(-) wheezing  (-) rales    Cardiovascular Exercise Tolerance: Good (-) hypertension(-) angina(-) CAD and (-) Past MI negative cardio ROS Normal cardiovascular exam Rhythm:Regular Rate:Normal     Neuro/Psych negative neurological ROS  negative psych ROS   GI/Hepatic negative GI ROS, Neg liver ROS,   Endo/Other  negative endocrine ROS  Renal/GU negative Renal ROS     Musculoskeletal negative musculoskeletal ROS (+)   Abdominal   Peds  Hematology negative hematology ROS (+)   Anesthesia Other Findings Day of surgery medications reviewed with the patient.  Reproductive/Obstetrics negative OB ROS                            Anesthesia Physical Anesthesia Plan  ASA: II  Anesthesia Plan: General   Post-op Pain Management:    Induction: Intravenous  Airway Management Planned: Oral ETT  Additional Equipment:   Intra-op Plan:   Post-operative Plan: Extubation in OR  Informed Consent: I have reviewed the patients History and Physical, chart, labs and discussed the procedure including the risks, benefits and alternatives for the proposed anesthesia with the patient or authorized representative who has  indicated his/her understanding and acceptance.   Dental advisory given  Plan Discussed with: CRNA  Anesthesia Plan Comments: (Risks/benefits of general anesthesia discussed with patient including risk of damage to teeth, lips, gum, and tongue, nausea/vomiting, allergic reactions to medications, and the possibility of heart attack, stroke and death.  All patient questions answered.  Patient wishes to proceed.  Fever, chills, cough last week.  Improved today.  Lungs clear to auscultation bilaterally.  Will proceed.)        Anesthesia Quick Evaluation

## 2015-01-20 NOTE — Anesthesia Postprocedure Evaluation (Signed)
Anesthesia Post Note  Patient: Cheyenne Scott  Procedure(s) Performed: Procedure(s) (LRB): ROBOTIC ASSISTED TOTAL HYSTERECTOMY WITH BILATERAL SALPINGECTOMY (Bilateral) ANTERIOR (CYSTOCELE) AND POSTERIOR REPAIR (RECTOCELE) WITH PERINEORRHAPHY AND UTEROSACRAL LIGAMENT  SUSPENSION  (N/A)  Patient location during evaluation: PACU Anesthesia Type: General Level of consciousness: awake and alert Pain management: pain level controlled Vital Signs Assessment: post-procedure vital signs reviewed and stable Respiratory status: spontaneous breathing, nonlabored ventilation, respiratory function stable and patient connected to nasal cannula oxygen Cardiovascular status: blood pressure returned to baseline and stable Postop Assessment: no signs of nausea or vomiting Anesthetic complications: no    Last Vitals:  Filed Vitals:   01/20/15 1616 01/20/15 1630  BP: 128/70 130/74  Pulse: 81 87  Temp: 36.6 C   Resp: 16 14    Last Pain: There were no vitals filed for this visit.               Tyjai Matuszak JENNETTE

## 2015-01-20 NOTE — Op Note (Signed)
01/20/2015  4:07 PM  PATIENT:  Cheyenne Scott  59 y.o. female  PRE-OPERATIVE DIAGNOSIS:  Cystocele, Rectocele, Uterine Prolapse  POST-OPERATIVE DIAGNOSIS:  cystocele,rectocele,uterine prolapse  PROCEDURE:  Procedure(s): ROBOTIC ASSISTED TOTAL HYSTERECTOMY WITH BILATERAL SALPINGECTOMY ANTERIOR (CYSTOCELE) AND POSTERIOR REPAIR (RECTOCELE) WITH PERINEORRHAPHY AND UTEROSACRAL LIGAMENT  SUSPENSION  LYSIS OF SIGMOID ADHESIONS TO LEFT TUBE AND OVARY MCCALL CUL DE PLASTY  SURGEON:  Surgeon(s): Brien Few, MD Azucena Fallen, MD  ASSISTANTS: MODY,MD   ANESTHESIA:   local and general  ESTIMATED BLOOD LOSS: * No blood loss amount entered *   DRAINS: Urinary Catheter (Foley)   LOCAL MEDICATIONS USED:  MARCAINE    and Amount: 20 ml  Source of Specimen:  UTERUS, CERVIX AND BILATERAL TUBAL SEGMENTS, ENDOCERVICAL POLYP  COUNTS:  YES  DICTATION #: [id number]  PLAN OF CARE: 23 HR EXTENDED STAY  PATIENT DISPOSITION:  PACU - hemodynamically stable.

## 2015-01-20 NOTE — Addendum Note (Signed)
Addendum  created 01/20/15 1709 by Lauretta Grill, MD   Modules edited: Orders, PRL Based Order Sets

## 2015-01-21 ENCOUNTER — Encounter (HOSPITAL_COMMUNITY): Payer: Self-pay | Admitting: Obstetrics and Gynecology

## 2015-01-21 DIAGNOSIS — N814 Uterovaginal prolapse, unspecified: Secondary | ICD-10-CM | POA: Diagnosis not present

## 2015-01-21 LAB — CBC
HEMATOCRIT: 35.7 % — AB (ref 36.0–46.0)
HEMOGLOBIN: 11.7 g/dL — AB (ref 12.0–15.0)
MCH: 29.5 pg (ref 26.0–34.0)
MCHC: 32.8 g/dL (ref 30.0–36.0)
MCV: 90.2 fL (ref 78.0–100.0)
Platelets: 253 10*3/uL (ref 150–400)
RBC: 3.96 MIL/uL (ref 3.87–5.11)
RDW: 12.7 % (ref 11.5–15.5)
WBC: 11.3 10*3/uL — AB (ref 4.0–10.5)

## 2015-01-21 LAB — BASIC METABOLIC PANEL
ANION GAP: 7 (ref 5–15)
BUN: 9 mg/dL (ref 6–20)
CALCIUM: 8.9 mg/dL (ref 8.9–10.3)
CO2: 29 mmol/L (ref 22–32)
Chloride: 101 mmol/L (ref 101–111)
Creatinine, Ser: 0.88 mg/dL (ref 0.44–1.00)
GFR calc non Af Amer: >60 mL/min (ref >60)
Glucose, Bld: 148 mg/dL — ABNORMAL HIGH (ref 65–99)
Potassium: 3.5 mmol/L (ref 3.5–5.1)
Sodium: 137 mmol/L (ref 135–145)

## 2015-01-21 MED ORDER — TRAMADOL HCL 50 MG PO TABS: 50.0000 mg | ORAL_TABLET | Freq: Four times a day (QID) | ORAL | Status: DC | PRN

## 2015-01-21 MED ORDER — OXYCODONE-ACETAMINOPHEN 5-325 MG PO TABS: 1.0000 | ORAL_TABLET | ORAL | Status: DC | PRN

## 2015-01-21 NOTE — Op Note (Signed)
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  PROCEDURES:  Da Vinci-assisted total laparoscopic hysterectomy with bilateral salpingectomy, anterior colporrhaphy, posterior colporrhaphy, perineorrhaphy, uterosacral ligament suspension, lysis of sigmoid adhesions to the left tube and ovary, McCall culdoplasty.  SURGEON:  Lovenia Kim, M.D.  ASSISTANTBenjie Karvonen, M.D.  ESTIMATED BLOOD LOSS:  50 mL.  COMPLICATIONS:  None.  DRAINS:  Foley.  COUNTS:  Correct.  DISPOSITION:  The patient to recovery in good condition.  BRIEF OPERATIVE NOTE:  After being apprised of risks of anesthesia, infection, bleeding, and no surrounding organs, possible need for repair, delayed versus immediate complications to include bowel and bladder injury, possible need for repair, the patient was brought to the operating room, where she was administered general anesthetic without complications.  She was prepped and draped in the usual sterile fashion. A Foley catheter placed.  Exam under anesthesia revealed a small anteflexed uterus with uterine apical vault prolapse, anterior cystocele, rectocele noted.  At this time, RUMI retractor placed vaginally without difficulty.  Infraumbilical incision then made with a scalpel.  Veress needle placed opening pressure -1.  Three liters of CO2 insufflated without difficulty.  Trocar placement  atraumatic. Visualization reveals adhesions of the left adnexa to the sigmoid colon and mesentery.  Normal size uterus with small fibroid.  Normal anterior cul-de-sac.  There were some sigmoid mesentery adhesions into the cul-de- sac as well.  Normal left and right ovary which appeared atrophic. Bilateral tubal segments after tubal ligation.  At this time, an 8-mm and 5-mm ports were placed in the left and an 8-mm port on the right. Deep Trendelenburg position was established, and the robot was docked in a standard fashion with placement of PK forceps and Endo Shears.  At this time, the adhesions to the left adnexa and cul-de-sac were lysed sharply, exposing, and otherwise, normal left adnexa was noted.  The distal segment of the tube was undermined and the mesosalpinx cauterized using bipolar cautery, cut and removed from the abdomen.  At this time, similar procedure was done on the right tube, which was also removed from the abdomen.  At this time, retroperitoneal space entry was done on the left.  Ureter was identified along the medial leaf of the peritoneum.  The tubo-ovarian ligament was skeletonized, cauterized with bipolar cautery and cut.  The round ligament was opened, and the uterine vessels on the left were skeletonized pushing the ureter distally from the medial leaf of the peritoneum and cauterizing the left uterine artery after sharp dissection of the bladder flap.  This did not divide it.  At this time, bladder flap was more sharply developed.  On the right side, tubal segment was  excised.  As noted, mesosalpinx undermined down to the level of the uterus.  Retroperitoneal space was entered. Ureter was identified.  The ovarian ligament was bipolar cauterized and divided.  The bladder flap was further developed.  The round ligament was opened and sharply dissected skeletonizing the right uterine vessels, which were cauterized and divided.  Uterine vessels on the  left cauterized and divided.  The specimen was then detached by monopolar cautery at the cervicovaginal junction.  The specimen was retracted vaginally without difficulty.  The vaginal cuff was then closed in 2 running imbricating layers of 0 V-Loc suture.  McCall culdoplasty suture was placed.  A 2-0 V-Loc suture was used for uterosacral ligament suspension in the standard fashion, placing proximal sutures through the uterosacral ligament to the distal segment and then multiple stitches through the vagina.  A 0 Ethibond suture was also used bilaterally at the level after the plication was done, and reperitonealization was achieved using a 2-0 V-Loc suture over the site of permanent suture. Good hemostasis noted.  The ureters were traced without evidence of kinking along both lateral sidewalls and upper peristalsing normally. At this time, attention was turned to the termination of procedure where the robot was undocked.  CO2 was released.  After achieving good hemostasis, the incisions have been noted to be closed using a 0 Vicryl, a 4-0 Vicryl, and Dermabond.  Attention was turned vaginally whereby a well-supported apical cuff was noted, clear urine was noted, and the anterior vaginal mucosa was incised in the linear fashion.  The pubocervical fascia was dissected sharply off the anterior vaginal mucosa, and the cystocele was reduced sharply.  Plication sutures were placed using interrupted 0 Vicryl sutures.  The vaginal cuff was trimmed, and the vagina was closed using a 2-0 Vicryl in interrupted fashion.  Posteriorly, the peritoneum was identified, and a wedge-shaped area of peritoneum was excised for the perineorrhaphy initiation. Dilute Pitressin solution placed.  Rectal exam revealed the anterior most portion of the rectocele.  The posterior vaginal wall was opened in a linear fashion.  The rectovaginal fascia was dissected sharply off the vaginal mucosa.  The rectocele was reduced  sharply and bluntly. Plication sutures of 0 Vicryl suture were placed.  The vagina was then closed using a 2-0 Vicryl in continuous running fashion.  Perineorrhaphy performed using 2-0 Vicryl in continuous running fashion.  Good hemostasis was noted.  Packing was placed.  The patient tolerated the procedure well.  Clear urine was noted.  Good vaginal level 1 support was noted.  The patient tolerated the procedure well and was transferred to recovery in good condition.     Lovenia Kim, M.D.     RJT/MEDQ  D:  01/20/2015  T:  01/21/2015  Job:  [id number]  cc:   [facility]

## 2015-01-21 NOTE — Progress Notes (Signed)
Patient discharged home with sister... Discharge instructions reviewed with patient and she verbalized understanding... Condition stable... No equipment... Taken to car via wheelchair by J. Potts, NT.

## 2015-01-21 NOTE — Progress Notes (Signed)
Vaginal packing removed as ordered.  Moderate amount dark drainage noted on packing.  Patient tolerated well.

## 2015-01-21 NOTE — Addendum Note (Signed)
Addendum  created 01/21/15 0747 by Ignacia Bayley, CRNA   Modules edited: Clinical Notes   Clinical Notes:  File: [id number]

## 2015-01-21 NOTE — Anesthesia Postprocedure Evaluation (Signed)
Anesthesia Post Note  Patient: Cheyenne Scott  Procedure(s) Performed: Procedure(s) (LRB): ROBOTIC ASSISTED TOTAL HYSTERECTOMY WITH BILATERAL SALPINGECTOMY (Bilateral) ANTERIOR (CYSTOCELE) AND POSTERIOR REPAIR (RECTOCELE) WITH PERINEORRHAPHY AND UTEROSACRAL LIGAMENT  SUSPENSION  (N/A)  Patient location during evaluation: [facility] Anesthesia Type: General Level of consciousness: awake and alert Pain management: pain level controlled Vital Signs Assessment: post-procedure vital signs reviewed and stable Respiratory status: spontaneous breathing Cardiovascular status: stable Postop Assessment: no signs of nausea or vomiting and adequate PO intake Anesthetic complications: no    Last Vitals:  Filed Vitals:   01/21/15 0508 01/21/15 0511  BP: 141/61   Pulse: 61   Temp: 36.8 C   Resp: 13 14    Last Pain:  Filed Vitals:   01/21/15 0647  PainSc: 2                  Mintie Witherington

## 2015-01-21 NOTE — Progress Notes (Signed)
1 Day Post-Op Procedure(s) (LRB): ROBOTIC ASSISTED TOTAL HYSTERECTOMY WITH BILATERAL SALPINGECTOMY (Bilateral) ANTERIOR (CYSTOCELE) AND POSTERIOR REPAIR (RECTOCELE) WITH PERINEORRHAPHY AND UTEROSACRAL LIGAMENT  SUSPENSION  (N/A)  Subjective: Patient reports nausea, incisional pain, tolerating PO, + flatus and no problems voiding.    Objective: I have reviewed patient's vital signs, intake and output, medications and labs. BP 141/61 mmHg  Pulse 61  Temp(Src) 98.2 F (36.8 C) (Oral)  Resp 14  Ht 5\' 3"  (1.6 m)  Wt 78.472 kg (173 lb)  BMI 30.65 kg/m2  SpO2 100%  CBC    Component Value Date/Time   WBC 11.3* 01/21/2015 0530   WBC 7.4 01/15/2015 1746   RBC 3.96 01/21/2015 0530   RBC 4.58 01/15/2015 1746   HGB 11.7* 01/21/2015 0530   HGB 13.8 01/15/2015 1746   HCT 35.7* 01/21/2015 0530   HCT 40.8 01/15/2015 1746   PLT 253 01/21/2015 0530   MCV 90.2 01/21/2015 0530   MCV 88.9 01/15/2015 1746   MCH 29.5 01/21/2015 0530   MCH 30.2 01/15/2015 1746   MCHC 32.8 01/21/2015 0530   MCHC 34.0 01/15/2015 1746   RDW 12.7 01/21/2015 0530      General: alert, cooperative and appears stated age Resp: clear to auscultation bilaterally and normal percussion bilaterally Cardio: regular rate and rhythm, S1, S2 normal, no murmur, click, rub or gallop and normal apical impulse GI: soft, non-tender; bowel sounds normal; no masses,  no organomegaly, normal findings: aorta normal and bowel sounds normal and incision: clean, dry and intact Extremities: extremities normal, atraumatic, no cyanosis or edema and Homans sign is negative, no sign of DVT Vaginal Bleeding: minimal  Assessment: s/p Procedure(s): ROBOTIC ASSISTED TOTAL HYSTERECTOMY WITH BILATERAL SALPINGECTOMY (Bilateral) ANTERIOR (CYSTOCELE) AND POSTERIOR REPAIR (RECTOCELE) WITH PERINEORRHAPHY AND UTEROSACRAL LIGAMENT  SUSPENSION  (N/A): stable, progressing well and tolerating diet  Plan: Advance diet Encourage ambulation Advance to PO  medication Discontinue IV fluids Discharge home     Cherae Marton J 01/21/2015, 6:46 AM

## 2015-06-09 ENCOUNTER — Ambulatory Visit (INDEPENDENT_AMBULATORY_CARE_PROVIDER_SITE_OTHER): Payer: BLUE CROSS/BLUE SHIELD | Admitting: Family Medicine

## 2015-06-09 VITALS — BP 124/68 | HR 94 | Temp 98.4°F | Resp 18 | Ht 64.5 in | Wt 170.0 lb

## 2015-06-09 DIAGNOSIS — H1131 Conjunctival hemorrhage, right eye: Secondary | ICD-10-CM | POA: Diagnosis not present

## 2015-06-09 NOTE — Progress Notes (Signed)
This is 60 year old retired woman who is moved [place] 2 years ago to be near her 2 grandchildren. She comes in because of her high being quite red for about a week. The redness seems to be fading in intensity but is still present in the lateral aspect of the right eye. She's had a little bit of a headache and some blurry vision.  Patient was lifting some furniture about a week ago. She has no other trauma or Valsalva type activities.  Objective: Patient has a subconjunctival hemorrhage in the right eye, lateral aspect. Fundi are normal, EOM intact, and surrounding tissues without abnormality.  Assessment: Simple subconjunctival hemorrhage of the right eye, resolving slowly.  Plan: Patient told to expect another week for complete resolution. She is not contagious. Is no sign of underlying disease  Signed, Carola Frost.D.

## 2015-06-09 NOTE — Patient Instructions (Addendum)
Subconjunctival Hemorrhage °Subconjunctival hemorrhage is bleeding that happens between the white part of your eye (sclera) and the clear membrane that covers the outside of your eye (conjunctiva). There are many tiny blood vessels near the surface of your eye. A subconjunctival hemorrhage happens when one or more of these vessels breaks and bleeds, causing a red patch to appear on your eye. This is similar to a bruise. °Depending on the amount of bleeding, the red patch may only cover a small area of your eye or it may cover the entire visible part of the sclera. If a lot of blood collects under the conjunctiva, there may also be swelling. Subconjunctival hemorrhages do not affect your vision or cause pain, but your eye may feel irritated if there is swelling. Subconjunctival hemorrhages usually do not require treatment, and they disappear on their own within two weeks. °CAUSES °This condition may be caused by: °· Mild trauma, such as rubbing your eye too hard. °· Severe trauma or blunt injuries. °· Coughing, sneezing, or vomiting. °· Straining, such as when lifting a heavy object. °· High blood pressure. °· Recent eye surgery. °· A history of diabetes. °· Certain medicines, especially blood thinners (anticoagulants). °· Other conditions, such as eye tumors, bleeding disorders, or blood vessel abnormalities. °Subconjunctival hemorrhages can happen without an obvious cause.  °SYMPTOMS  °Symptoms of this condition include: °· A bright red or dark red patch on the white part of the eye. °¨ The red area may spread out to cover a larger area of the eye before it goes away. °¨ The red area may turn brownish-yellow before it goes away. °· Swelling. °· Mild eye irritation. °DIAGNOSIS °This condition is diagnosed with a physical exam. If your subconjunctival hemorrhage was caused by trauma, your health care provider may refer you to an eye specialist (ophthalmologist) or another specialist to check for other injuries. You  may have other tests, including: °· An eye exam. °· A blood pressure check. °· Blood tests to check for bleeding disorders. °If your subconjunctival hemorrhage was caused by trauma, X-rays or a CT scan may be done to check for other injuries. °TREATMENT °Usually, no treatment is needed. Your health care provider may recommend eye drops or cold compresses to help with discomfort. °HOME CARE INSTRUCTIONS °· Take over-the-counter and prescription medicines only as directed by your health care provider. °· Use eye drops or cold compresses to help with discomfort as directed by your health care provider. °· Avoid activities, things, and environments that may irritate or injure your eye. °· Keep all follow-up visits as told by your health care provider. This is important. °SEEK MEDICAL CARE IF: °· You have pain in your eye. °· The bleeding does not go away within 3 weeks. °· You keep getting new subconjunctival hemorrhages. °SEEK IMMEDIATE MEDICAL CARE IF: °· Your vision changes or you have difficulty seeing. °· You suddenly develop severe sensitivity to light. °· You develop a severe headache, persistent vomiting, confusion, or abnormal tiredness (lethargy). °· Your eye seems to bulge or protrude from your eye socket. °· You develop unexplained bruises on your body. °· You have unexplained bleeding in another area of your body. °  °This information is not intended to replace advice given to you by your health care provider. Make sure you discuss any questions you have with your health care provider. °  °Document Released: 01/25/2005 Document Revised: 10/16/2014 Document Reviewed: 04/03/2014 °Elsevier Interactive Patient Education ©2016 [employer] ° °

## 2020-02-18 ENCOUNTER — Ambulatory Visit
Admission: RE | Admit: 2020-02-18 | Discharge: 2020-02-18 | Disposition: A | Discharge status: Home / Self Care | Payer: Self-pay | Source: Ambulatory Visit | Attending: Hematology and Oncology | Admitting: Hematology and Oncology

## 2020-02-18 ENCOUNTER — Telehealth: Payer: Self-pay | Admitting: Hematology and Oncology

## 2020-02-18 ENCOUNTER — Other Ambulatory Visit: Payer: Self-pay

## 2020-02-18 DIAGNOSIS — C20 Malignant neoplasm of rectum: Secondary | ICD-10-CM

## 2020-02-18 NOTE — Progress Notes (Signed)
Called [facility] in [place] and requested images from 01/29/2020 CT chest and MR pelvis be pushed to [employer].   Order entered to link outside films.

## 2020-02-18 NOTE — Telephone Encounter (Signed)
Our office received a call from Ms. Brine to schedule a new pt appt rectal cancer. She requested to see Dr. Benay Spice, but I informed her Dr. Benay Spice doesn't have any availabilities for this month at this time. She has been scheduled to see Dr. [name] on 1/13 at 2pm. Pt aware to arrive 20 minutes early.

## 2020-02-21 ENCOUNTER — Telehealth: Payer: Self-pay | Admitting: Hematology and Oncology

## 2020-02-21 ENCOUNTER — Inpatient Hospital Stay: Payer: Medicare Other | Attending: Hematology and Oncology

## 2020-02-21 ENCOUNTER — Inpatient Hospital Stay (HOSPITAL_BASED_OUTPATIENT_CLINIC_OR_DEPARTMENT_OTHER): Payer: Medicare Other | Admitting: Hematology and Oncology

## 2020-02-21 ENCOUNTER — Other Ambulatory Visit: Payer: Self-pay

## 2020-02-21 ENCOUNTER — Other Ambulatory Visit: Payer: Self-pay | Admitting: *Deleted

## 2020-02-21 ENCOUNTER — Encounter: Payer: Self-pay | Admitting: Hematology and Oncology

## 2020-02-21 VITALS — BP 164/69 | HR 88 | Temp 97.9°F | Resp 13 | Wt 165.9 lb

## 2020-02-21 DIAGNOSIS — C20 Malignant neoplasm of rectum: Secondary | ICD-10-CM

## 2020-02-21 DIAGNOSIS — I10 Essential (primary) hypertension: Secondary | ICD-10-CM | POA: Diagnosis not present

## 2020-02-21 DIAGNOSIS — F419 Anxiety disorder, unspecified: Secondary | ICD-10-CM | POA: Diagnosis not present

## 2020-02-21 DIAGNOSIS — Z5111 Encounter for antineoplastic chemotherapy: Secondary | ICD-10-CM | POA: Insufficient documentation

## 2020-02-21 DIAGNOSIS — K219 Gastro-esophageal reflux disease without esophagitis: Secondary | ICD-10-CM | POA: Insufficient documentation

## 2020-02-21 DIAGNOSIS — D500 Iron deficiency anemia secondary to blood loss (chronic): Secondary | ICD-10-CM

## 2020-02-21 LAB — CBC WITH DIFFERENTIAL (CANCER CENTER ONLY)
Abs Immature Granulocytes: 0.05 10*3/uL (ref 0.00–0.07)
Basophils Absolute: 0.1 10*3/uL (ref 0.0–0.1)
Basophils Relative: 1 %
Eosinophils Absolute: 0.4 10*3/uL (ref 0.0–0.5)
Eosinophils Relative: 4 %
HCT: 41.8 % (ref 36.0–46.0)
Hemoglobin: 13.8 g/dL (ref 12.0–15.0)
Immature Granulocytes: 1 %
Lymphocytes Relative: 29 %
Lymphs Abs: 2.9 10*3/uL (ref 0.7–4.0)
MCH: 29.9 pg (ref 26.0–34.0)
MCHC: 33 g/dL (ref 30.0–36.0)
MCV: 90.5 fL (ref 80.0–100.0)
Monocytes Absolute: 0.9 10*3/uL (ref 0.1–1.0)
Monocytes Relative: 9 %
Neutro Abs: 5.8 10*3/uL (ref 1.7–7.7)
Neutrophils Relative %: 56 %
Platelet Count: 274 10*3/uL (ref 150–400)
RBC: 4.62 MIL/uL (ref 3.87–5.11)
RDW: 13.1 % (ref 11.5–15.5)
WBC Count: 10.2 10*3/uL (ref 4.0–10.5)
nRBC: 0 % (ref 0.0–0.2)

## 2020-02-21 LAB — CMP (CANCER CENTER ONLY)
ALT: 25 U/L (ref 0–44)
AST: 23 U/L (ref 15–41)
Albumin: 4.6 g/dL (ref 3.5–5.0)
Alkaline Phosphatase: 80 U/L (ref 38–126)
Anion gap: 11 (ref 5–15)
BUN: 14 mg/dL (ref 8–23)
CO2: 26 mmol/L (ref 22–32)
Calcium: 9.3 mg/dL (ref 8.9–10.3)
Chloride: 103 mmol/L (ref 98–111)
Creatinine: 0.84 mg/dL (ref 0.44–1.00)
GFR, Estimated: >60 mL/min (ref >60)
Glucose, Bld: 96 mg/dL (ref 70–99)
Potassium: 3.4 mmol/L — ABNORMAL LOW (ref 3.5–5.1)
Sodium: 140 mmol/L (ref 135–145)
Total Bilirubin: 0.6 mg/dL (ref 0.3–1.2)
Total Protein: 8 g/dL (ref 6.5–8.1)

## 2020-02-21 LAB — MAGNESIUM: Magnesium: 2.5 mg/dL — ABNORMAL HIGH (ref 1.7–2.4)

## 2020-02-21 MED ORDER — PROCHLORPERAZINE MALEATE 10 MG PO TABS: 10.0000 mg | ORAL_TABLET | Freq: Four times a day (QID) | ORAL | 0 refills | Status: DC | PRN

## 2020-02-21 MED ORDER — LIDOCAINE-PRILOCAINE 2.5-2.5 % EX CREA
1.0000 | TOPICAL_CREAM | CUTANEOUS | 0 refills | Status: DC | PRN
Start: 2020-02-21 — End: 2020-11-10

## 2020-02-21 MED ORDER — ONDANSETRON HCL 8 MG PO TABS: 8.0000 mg | ORAL_TABLET | Freq: Three times a day (TID) | ORAL | 0 refills | Status: DC | PRN

## 2020-02-21 NOTE — Telephone Encounter (Signed)
Scheduled appt per 1/13 sch msg - left message for patient with apt date and time

## 2020-02-21 NOTE — Progress Notes (Signed)
START ON PATHWAY REGIMEN - Colorectal     A cycle is every 14 days:     Oxaliplatin      Leucovorin      Fluorouracil      Fluorouracil   **Always confirm dose/schedule in your pharmacy ordering system**  Patient Characteristics: Preoperative or Nonsurgical Candidate (Clinical Staging), Rectal, cT3 - cT4, cN0 or Any cT, cN+ Tumor Location: Rectal Therapeutic Status: Preoperative or Nonsurgical Candidate (Clinical Staging) AJCC T Category: cT3 AJCC N Category: cN1b AJCC M Category: cM0 AJCC 8 Stage Grouping: IIIB Intent of Therapy: Curative Intent, Discussed with Patient 

## 2020-02-21 NOTE — Progress Notes (Signed)
[place] [phone]   Fax:([phone]) [place] NOTE  Patient Care Team: [name], RN as Oncology Nurse Navigator [name], MD as Consulting Physician (Hematology and Oncology)  Hematological/Oncological History # Rectal Adenocarcainoma, Stage T3N1M0 1) 01/18/2020: colonoscopy due to bright red blood per rectum. A mass was found in the rectum 8 cm from the anal verge. Biopsy confirms moderately differentiated adenocarcinoma.  2) 01/29/2020: CT chest showed no evidence of metastatic disease. MRI showed a T2-T3 malignancy that appears to involve the internal sphincter and 2 mesorectal nodes 3) 02/07/2020: seen at [facility] by Oncologist Dr. Georgiann Cocker 4) 02/11/2020: seen at [facility] by Dr. [name] 5) 02/21/2020: establish care with Dr. [name]   CHIEF COMPLAINTS/PURPOSE OF CONSULTATION:  "Rectal cancer "  HISTORY OF PRESENTING ILLNESS:  Cheyenne Scott 65 y.o. female with medical history significant for newly diagnosed rectal cancer who presents to establish care.   On review of the previous records Cheyenne Scott colonoscopy on 01/18/2020 due to concern for bright red blood per rectum.  During that colonoscopy she was found to have a mass 8 cm from the anal verge in the rectum.  Biopsy was performed and showed moderately differentiated adenocarcinoma.  On 01/29/2020 the patient had a CT scan of the chest which showed no evidence of metastatic disease.  An MRI of the pelvis and abdomen showed a T3 malignancy with what appeared to be the internal sphincter and to mesorectal nodes on 02/07/2020 the patient was seen at [facility] by Dr. Georgiann Cocker and subsequently on 02/10/2018 was seen by Dr. [name] at [facility] she was told that the [facility] could administer the treatments that she required closer to home.  Therefore she presents today to establish care with a  local medical oncologist.  On exam today Cheyenne Scott is accompanied by her daughter-in-law.  She reports that this all began in May when she was having symptoms of acid reflux.  She was evaluated with an EGD at that time and shortly thereafter began developing blood on the tissue paper.  She has that occasionally there is little bit of blood on the tip of the stool, but for the most part is relegated to the paper and only occurred sporadically.  Due to this the GI doctor performing her endoscopy recommended a colonoscopy.  This was performed on December 10 and discovered the rectal mass.  She notes that she has otherwise been quite well in the interim since that time.  She reports that she does occasionally have some blood in her stool but no abdominal pain, cramping, or diarrhea.  She does occasionally struggle with constipation as the only discomfort she has.  She has been taking in more fiber will try to help with this.  Her weight has been steady and she denies any fevers, chills, sweats, nausea, vomiting or diarrhea.  A full 10 point ROS is listed below.  On further discussion she notes that her brother had multiple myeloma and her father had Parkinson's disease, but that there was no family history of solid tumor.  She is a never smoker and only sporadically drinks.  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  MEDICAL HISTORY:  Past Medical History:  Diagnosis Date  . Allergy     SURGICAL HISTORY: Past Surgical History:  Procedure Laterality Date  . ANTERIOR AND POSTERIOR REPAIR N/A 01/20/2015  Procedure: ANTERIOR (CYSTOCELE) AND POSTERIOR REPAIR (RECTOCELE) WITH PERINEORRHAPHY AND UTEROSACRAL LIGAMENT  SUSPENSION ;  Surgeon: Brien Few, MD;  Location: [facility];  Service: Gynecology;  Laterality: N/A;  . ROBOTIC ASSISTED TOTAL HYSTERECTOMY WITH SALPINGECTOMY Bilateral 01/20/2015   Procedure: ROBOTIC ASSISTED TOTAL HYSTERECTOMY WITH  BILATERAL SALPINGECTOMY;  Surgeon: Brien Few, MD;  Location: [facility];  Service: Gynecology;  Laterality: Bilateral;  . TUBAL LIGATION      SOCIAL HISTORY: Social History   Socioeconomic History  . Marital status: Widowed    Spouse name: Not on file  . Number of children: Not on file  . Years of education: Not on file  . Highest education level: Not on file  Occupational History  . Not on file  Tobacco Use  . Smoking status: Never Smoker  . Smokeless tobacco: Not on file  Substance and Sexual Activity  . Alcohol use: No    Alcohol/week: 0.0 standard drinks  . Drug use: No  . Sexual activity: Not on file  Other Topics Concern  . Not on file  Social History Narrative  . Not on file   Social Determinants of Health   Financial Resource Strain: Not on file  Food Insecurity: Not on file  Transportation Needs: Not on file  Physical Activity: Not on file  Stress: Not on file  Social Connections: Not on file  Intimate Partner Violence: Not on file    FAMILY HISTORY: Family History  Problem Relation Age of Onset  . Diabetes Mother   . Heart disease Mother   . Cancer Brother   . Stroke Maternal Grandmother     ALLERGIES:  is allergic to penicillins.  MEDICATIONS:  Current Outpatient Medications  Medication Sig Dispense Refill  . Cholecalciferol (VITAMIN D3) 25 MCG (1000 UT) CAPS Take by mouth.    [name] ascorbic acid (VITAMIN C) 250 MG tablet Take by mouth.    . estradiol (ESTRACE) 0.1 MG/GM vaginal cream Place vaginally.    [name] L-Lysine 500 MG TABS Take by mouth.    . Multiple Vitamin (MULTIVITAMIN WITH MINERALS) TABS tablet Take 1 tablet by mouth daily.    [name] omeprazole (PRILOSEC OTC) 20 MG tablet Take by mouth.    . zinc gluconate 50 MG tablet Take by mouth.     No current facility-administered medications for this visit.    REVIEW OF SYSTEMS:   Constitutional: ( - ) fevers, ( - )  chills , ( - ) night sweats Eyes: ( - ) blurriness of vision, ( - ) double vision,  ( - ) watery eyes Ears, nose, mouth, throat, and face: ( - ) mucositis, ( - ) sore throat Respiratory: ( - ) cough, ( - ) dyspnea, ( - ) wheezes Cardiovascular: ( - ) palpitation, ( - ) chest discomfort, ( - ) lower extremity swelling Gastrointestinal:  ( - ) nausea, ( - ) heartburn, ( - ) change in bowel habits Skin: ( - ) abnormal skin rashes Lymphatics: ( - ) new lymphadenopathy, ( - ) easy bruising Neurological: ( - ) numbness, ( - ) tingling, ( - ) new weaknesses Behavioral/Psych: ( - ) mood change, ( - ) new changes  All other systems were reviewed with the patient and are negative.  PHYSICAL EXAMINATION: ECOG PERFORMANCE STATUS: 0 - Asymptomatic  Vitals:   02/21/20 1431  BP: (!) 164/69  Pulse: 88  Resp: 13  Temp: 97.9 F (36.6 C)  SpO2: 100%   Filed Weights   02/21/20 1431  Weight: 165 lb 14.4 oz (75.3 kg)    GENERAL: well appearing middle aged Caucasian female in NAD  SKIN: skin color, texture, turgor are normal, no rashes or significant lesions EYES: conjunctiva are pink and non-injected, sclera clear LUNGS: clear to auscultation and percussion with normal breathing effort HEART: regular rate & rhythm and no murmurs and no lower extremity edema ABDOMEN: soft, non-tender, non-distended, normal bowel sounds Musculoskeletal: no cyanosis of digits and no clubbing  PSYCH: alert & oriented x 3, fluent speech NEURO: no focal motor/sensory deficits  LABORATORY DATA:  I have reviewed the data as listed CBC Latest Ref Rng & Units 02/21/2020 01/21/2015 01/15/2015  WBC 4.0 - 10.5 K/uL 10.2 11.3(H) 7.4  Hemoglobin 12.0 - 15.0 g/dL 13.8 11.7(L) 13.8  Hematocrit 36.0 - 46.0 % 41.8 35.7(L) 40.8  Platelets 150 - 400 K/uL 274 253 -    CMP Latest Ref Rng & Units 01/21/2015 01/10/2015  Glucose 65 - 99 mg/dL 148(H) 94  BUN 6 - 20 mg/dL 9 17  Creatinine 0.44 - 1.00 mg/dL 0.88 0.79  Sodium 135 - 145 mmol/L 137 140  Potassium 3.5 - 5.1 mmol/L 3.5 3.5  Chloride 101 - 111 mmol/L 101  105  CO2 22 - 32 mmol/L 29 28  Calcium 8.9 - 10.3 mg/dL 8.9 9.5    PATHOLOGY: Final Diagnosis    Rectum (mass), biopsy [facility], their [id number]):              Invasive moderately differentiated adenocarcinoma.               Acute inflammatory changes.  Electronically signed by Rowland Lathe, MD on 02/06/2020 at 1:32 PM  Clinical Information  Outside slides for review.   Gross Description    1. A. Accession number of referred [id number]  B. Number of referred slides:2  C. Date of Surgery/Collection:01/18/2020  D. My Report and Slides sent to:                        Pathologist:Davonia Legrand Como, MD            Institution: [facility]            [address], [place], [place], Zip Code: [place], [place] [address]            [place]            Fax: [phone]       RADIOGRAPHIC STUDIES: I have personally reviewed the radiological images as listed and agreed with the findings in the report. No results found.  ASSESSMENT & PLAN Coni Homesley 64 y.o. female with medical history significant for newly diagnosed rectal cancer who presents to establish care.  After review the labs, the records, discussed with the patient the findings most consistent with a T3 N1 M0 stage III rectal adenocarcinoma.  The treatment of choice for this would be total neoadjuvant treatment prior to surgical resection.  My preference in this case would be to proceed with FOLFOX chemotherapy, followed by capecitabine with radiation for chemoradiation therapy.  Finally the patient will be treated with a surgical approach thereafter.  The benefits of this include greater tolerability and higher probability of completing all treatments.  For starters we will plan to treat the patient with FOLFOX chemotherapy every 2 weeks x8 cycles, for total of 16 weeks for the treatment.  Then we will start chemoradiation with  capecitabine 825 mg per metered  squared twice daily on days of radiation, typically for about 28 fractions.  Cheyenne Scott will be establishing with Dr. Lisbeth Renshaw will be performing the radiation component of her treatment.  Once this is complete we will reevaluate with repeat imaging in proceed to surgery if feasible.  The patient and her daughter-in-law voiced their understanding of this plan moving forward.  Today we discussed the FOLFOX chemotherapy treatment and the expected side effects including nausea, vomiting, diarrhea, neutropenia, nerve pain, and hand-foot syndrome.  Discussed that these are the most common and dangerous side effects, but is not limited to these alone.  The patient was agreeable to proceeding with FOLFOX chemotherapy at this time.  We will plan for chemotherapy education as well as port placement as soon as possible.  # Rectal Adenocarcainoma, Stage T3N1M0 -- Findings consistent with a stage III T3 N1 M0 rectal adenocarcinoma.  Treatment of choice would be total neoadjuvant chemotherapy followed by surgical resection. -- Plan to start with FOLFOX chemotherapy as early as 03/05/2020.  We will sure the patient has port placement and chemotherapy education prior to the start. -- Return to clinic on 03/05/2020 for first day of treatment. -- Today we will order baseline CMP, CBC, and CEA.  #Supportive Care --chemotherapy education to be scheduled  --zofran 63m q8H PRN and compazine 154mPO q6H for nausea -- EMLA cream for port -- no pain medication required at this time.    Orders Placed This Encounter  Procedures  . CBC with Differential (Cancer Center Only)    Standing Status:   Future    Number of Occurrences:   1    Standing Expiration Date:   02/20/2021  . CMP ([facility])    Standing Status:   Future    Number of Occurrences:   1    Standing Expiration Date:   02/20/2021  . CEA ([facility])    Standing Status:   Future    Number of Occurrences:   1    Standing Expiration Date:    02/20/2021  . Magnesium    Standing Status:   Future    Number of Occurrences:   1    Standing Expiration Date:   02/20/2021  . Iron and TIBC    Standing Status:   Future    Number of Occurrences:   1    Standing Expiration Date:   02/20/2021  . Ferritin    Standing Status:   Future    Number of Occurrences:   1    Standing Expiration Date:   02/20/2021    All questions were answered. The patient knows to call the clinic with any problems, questions or concerns.  A total of more than 60 minutes were spent on this encounter and over half of that time was spent on counseling and coordination of care as outlined above.   JoLedell PeoplesMD Department of Hematology/Oncology [facility]: [phone]: [phone]: [email or web address]  02/21/2020 5:14 PM

## 2020-02-22 LAB — CEA (IN HOUSE-CHCC): CEA (CHCC-In House): 3.56 ng/mL (ref 0.00–5.00)

## 2020-02-22 LAB — IRON AND TIBC
Iron: 66 ug/dL (ref 41–142)
Saturation Ratios: 19 % — ABNORMAL LOW (ref 21–57)
TIBC: 348 ug/dL (ref 236–444)
UIBC: 282 ug/dL (ref 120–384)

## 2020-02-22 LAB — FERRITIN: Ferritin: 92 ng/mL (ref 11–307)

## 2020-02-22 NOTE — Progress Notes (Signed)
I met with the patient and her daughter after their consult with Dr. [name] today.  I explained my role as nurse navigator and they were both given my direct contact information.   I have encouraged her to call with any questions or concerns.  She understands the plan of care outlined by Dr. [name].  She will have port a cath placement next Tuesday 1/18 and will have consult with radiation oncology on 1/19.  Our plan is to attempt to start treatment the following week.  I escorted them to the lab to obtain some baseline lab studies.

## 2020-02-25 ENCOUNTER — Other Ambulatory Visit: Payer: Self-pay | Admitting: Radiology

## 2020-02-25 ENCOUNTER — Telehealth: Payer: Self-pay | Admitting: Hematology and Oncology

## 2020-02-25 NOTE — Telephone Encounter (Signed)
Scheduled per 1/13 los. Pt will receive an updated appt calendar per next visit appt notes  

## 2020-02-26 ENCOUNTER — Ambulatory Visit (HOSPITAL_COMMUNITY)
Admission: RE | Admit: 2020-02-26 | Discharge: 2020-02-26 | Disposition: A | Discharge status: Home / Self Care | Payer: Medicare Other | Source: Ambulatory Visit | Attending: Hematology and Oncology | Admitting: Hematology and Oncology

## 2020-02-26 ENCOUNTER — Other Ambulatory Visit: Payer: Self-pay

## 2020-02-26 ENCOUNTER — Encounter (HOSPITAL_COMMUNITY): Payer: Self-pay

## 2020-02-26 DIAGNOSIS — Z79899 Other long term (current) drug therapy: Secondary | ICD-10-CM | POA: Diagnosis not present

## 2020-02-26 DIAGNOSIS — Z880 Allergy status to penicillin: Secondary | ICD-10-CM | POA: Diagnosis not present

## 2020-02-26 DIAGNOSIS — C20 Malignant neoplasm of rectum: Secondary | ICD-10-CM

## 2020-02-26 HISTORY — PX: IR IMAGING GUIDED PORT INSERTION: IMG5740

## 2020-02-26 MED ORDER — LIDOCAINE-EPINEPHRINE 1 %-1:100000 IJ SOLN
INTRAMUSCULAR | Status: AC
  Filled 2020-02-26: qty 1

## 2020-02-26 MED ORDER — LIDOCAINE HCL 1 % IJ SOLN
INTRAMUSCULAR | Status: AC
  Filled 2020-02-26: qty 20

## 2020-02-26 MED ORDER — FENTANYL CITRATE (PF) 100 MCG/2ML IJ SOLN
INTRAMUSCULAR | Status: AC
  Filled 2020-02-26: qty 2

## 2020-02-26 MED ORDER — MIDAZOLAM HCL 2 MG/2ML IJ SOLN
INTRAMUSCULAR | Status: AC | PRN
Start: 2020-02-26 — End: 2020-02-26
  Administered 2020-02-26: 1 mg via INTRAVENOUS
  Administered 2020-02-26: 0.5 mg via INTRAVENOUS
  Administered 2020-02-26: 1 mg via INTRAVENOUS
  Administered 2020-02-26: 0.5 mg via INTRAVENOUS

## 2020-02-26 MED ORDER — HEPARIN SOD (PORK) LOCK FLUSH 100 UNIT/ML IV SOLN
INTRAVENOUS | Status: AC
  Filled 2020-02-26: qty 5

## 2020-02-26 MED ORDER — MIDAZOLAM HCL 2 MG/2ML IJ SOLN
INTRAMUSCULAR | Status: AC
  Filled 2020-02-26: qty 4

## 2020-02-26 MED ORDER — CLINDAMYCIN PHOSPHATE 900 MG/50ML IV SOLN: 900.0000 mg | Freq: Once | INTRAVENOUS | Status: AC

## 2020-02-26 MED ORDER — HEPARIN SOD (PORK) LOCK FLUSH 100 UNIT/ML IV SOLN
INTRAVENOUS | Status: AC | PRN
  Administered 2020-02-26: 500 [IU] via INTRAVENOUS

## 2020-02-26 MED ORDER — LIDOCAINE HCL (PF) 1 % IJ SOLN
INTRAMUSCULAR | Status: AC | PRN
  Administered 2020-02-26: 10 mL via INTRADERMAL

## 2020-02-26 MED ORDER — LIDOCAINE-EPINEPHRINE 1 %-1:100000 IJ SOLN
INTRAMUSCULAR | Status: AC | PRN
  Administered 2020-02-26: 10 mL via INTRADERMAL

## 2020-02-26 MED ORDER — FENTANYL CITRATE (PF) 100 MCG/2ML IJ SOLN
INTRAMUSCULAR | Status: AC | PRN
  Administered 2020-02-26 (×2): 50 ug via INTRAVENOUS

## 2020-02-26 MED ORDER — CLINDAMYCIN PHOSPHATE 900 MG/50ML IV SOLN
INTRAVENOUS | Status: AC
  Administered 2020-02-26: 900 mg via INTRAVENOUS
  Filled 2020-02-26: qty 50

## 2020-02-26 MED ORDER — SODIUM CHLORIDE 0.9 % IV SOLN: INTRAVENOUS | Status: DC

## 2020-02-26 NOTE — Progress Notes (Signed)
GI Location of Tumor / Histology: Rectal Cancer- Adenocarcinoma  Cheyenne Scott states all of this began in May 2021 when she was experiencing symptoms of acid reflux.  She was evaluated with an EGD at that time and shortly thereafter began developing blood on the tissue paper.  She states occasionally there is little bit of blood on the tip of the stool, but for the most part is relegated to the paper and only occurs sporadically.  MRI Abdomen/Pelvis 02/07/2020: T3 malignancy with what appeared to be the internal sphincter and to mesorectal nodes.  CT Chest 01/29/2020: No evidence of metastatic disease.  Colonoscopy 01/18/2020: A mass was found in the rectum 8 cm from the anal verge.  Biopsies of Rectal Mass 01/18/2020    Past/Anticipated interventions by surgeon, if any:   Past/Anticipated interventions by medical oncology, if any:  Dr. [name] 02/21/2020 -The treatment of choice for this would be total neoadjuvant treatment prior to surgical resection.   My preference in this case would be to proceed with FOLFOX chemotherapy. - followed by capecitabine with radiation for chemoradiation therapy.   -Finally the patient will be treated with a surgical approach thereafter. - Cheyenne Scott will be establishing with Dr. Lisbeth Renshaw will be performing the radiation component of her treatment.   -Once this is complete we will reevaluate with repeat imaging in proceed to surgery if feasible. -Plan to start with FOLFOX chemotherapy as early as 03/05/2020.  We will sure the patient has port placement and chemotherapy education prior to the start.  Weight changes, if any: No  Bowel/Bladder complaints, if any: Constipation if she does not take her fiber on regular basis.  No urinary changes.  Nausea / Vomiting, if any: Has a little nausea this am.  No vomiting noted.  Pain issues, if any: Only if she has constipation.   Any blood per rectum:  Occasional in her stool.  She states on occasion there is  just drops of blood in the bowl or on the tips of the stool.  SAFETY ISSUES:  Prior radiation? No  Pacemaker/ICD? No  Possible current pregnancy? Hysterectomy, postmenopausal  Is the patient on methotrexate? No  Current Complaints/Details: -Right Chest [facility] 02/26/2020

## 2020-02-26 NOTE — Procedures (Signed)
Interventional Radiology Procedure:   Indications: Rectal cancer  Procedure: Port placement  Findings: Right jugular port, tip at SVC/RA junction  Complications: None     EBL: Minimal, less than 10 ml  Plan: Discharge in one hour.  Keep port site and incisions dry for at least 24 hours.     Zoii Florer R. Meril Dray, MD  Pager: [phone]   

## 2020-02-26 NOTE — Discharge Instructions (Signed)
Please call Interventional Radiology clinic [phone] with any questions or concerns.  You may remove your dressing and shower tomorrow.  DO NOT use EMLA cream for 2 weeks after port placement as this cream will remove surgical glue on your incision.   Implanted Port Insertion, Care After This sheet gives you information about how to care for yourself after your procedure. Your health care provider may also give you more specific instructions. If you have problems or questions, contact your health care provider. What can I expect after the procedure? After the procedure, it is common to have:  Discomfort at the port insertion site.  Bruising on the skin over the port. This should improve over 3-4 days. Follow these instructions at home: Port care  After your port is placed, you will get a manufacturer's information card. The card has information about your port. Keep this card with you at all times.  Take care of the port as told by your health care provider. Ask your health care provider if you or a family member can get training for taking care of the port at home. A home health care nurse may also take care of the port.  Make sure to remember what type of port you have. Incision care  Follow instructions from your health care provider about how to take care of your port insertion site. Make sure you: ? Wash your hands with soap and water before and after you change your bandage (dressing). If soap and water are not available, use hand sanitizer. ? Change your dressing as told by your health care provider. ? Leave stitches (sutures), skin glue, or adhesive strips in place. These skin closures may need to stay in place for 2 weeks or longer. If adhesive strip edges start to loosen and curl up, you may trim the loose edges. Do not remove adhesive strips completely unless your health care provider tells you to do that.  Check your port insertion site every day for signs of infection.  Check for: ? Redness, swelling, or pain. ? Fluid or blood. ? Warmth. ? Pus or a bad smell.      Activity  Return to your normal activities as told by your health care provider. Ask your health care provider what activities are safe for you.  Do not lift anything that is heavier than 10 lb (4.5 kg), or the limit that you are told, until your health care provider says that it is safe. General instructions  Take over-the-counter and prescription medicines only as told by your health care provider.  Do not take baths, swim, or use a hot tub until your health care provider approves. Ask your health care provider if you may take showers. You may only be allowed to take sponge baths.  Do not drive for 24 hours if you were given a sedative during your procedure.  Wear a medical alert bracelet in case of an emergency. This will tell any health care providers that you have a port.  Keep all follow-up visits as told by your health care provider. This is important. Contact a health care provider if:  You cannot flush your port with saline as directed, or you cannot draw blood from the port.  You have a fever or chills.  You have redness, swelling, or pain around your port insertion site.  You have fluid or blood coming from your port insertion site.  Your port insertion site feels warm to the touch.  You have pus or a   bad smell coming from the port insertion site. Get help right away if:  You have chest pain or shortness of breath.  You have bleeding from your port that you cannot control. Summary  Take care of the port as told by your health care provider. Keep the manufacturer's information card with you at all times.  Change your dressing as told by your health care provider.  Contact a health care provider if you have a fever or chills or if you have redness, swelling, or pain around your port insertion site.  Keep all follow-up visits as told by your health care  provider. This information is not intended to replace advice given to you by your health care provider. Make sure you discuss any questions you have with your health care provider. Document Revised: 08/23/2017 Document Reviewed: 08/23/2017 Elsevier Patient Education  2021 Elsevier Inc.    Moderate Conscious Sedation, Adult, Care After This sheet gives you information about how to care for yourself after your procedure. Your health care provider may also give you more specific instructions. If you have problems or questions, contact your health care provider. What can I expect after the procedure? After the procedure, it is common to have:  Sleepiness for several hours.  Impaired judgment for several hours.  Difficulty with balance.  Vomiting if you eat too soon. Follow these instructions at home: For the time period you were told by your health care provider:  Rest.  Do not participate in activities where you could fall or become injured.  Do not drive or use machinery.  Do not drink alcohol.  Do not take sleeping pills or medicines that cause drowsiness.  Do not make important decisions or sign legal documents.  Do not take care of children on your own.      Eating and drinking  Follow the diet recommended by your health care provider.  Drink enough fluid to keep your urine pale yellow.  If you vomit: ? Drink water, juice, or soup when you can drink without vomiting. ? Make sure you have little or no nausea before eating solid foods.   General instructions  Take over-the-counter and prescription medicines only as told by your health care provider.  Have a responsible adult stay with you for the time you are told. It is important to have someone help care for you until you are awake and alert.  Do not smoke.  Keep all follow-up visits as told by your health care provider. This is important. Contact a health care provider if:  You are still sleepy or having  trouble with balance after 24 hours.  You feel light-headed.  You keep feeling nauseous or you keep vomiting.  You develop a rash.  You have a fever.  You have redness or swelling around the IV site. Get help right away if:  You have trouble breathing.  You have new-onset confusion at home. Summary  After the procedure, it is common to feel sleepy, have impaired judgment, or feel nauseous if you eat too soon.  Rest after you get home. Know the things you should not do after the procedure.  Follow the diet recommended by your health care provider and drink enough fluid to keep your urine pale yellow.  Get help right away if you have trouble breathing or new-onset confusion at home. This information is not intended to replace advice given to you by your health care provider. Make sure you discuss any questions you have with your health   care provider. Document Revised: 05/25/2019 Document Reviewed: 12/21/2018 Elsevier Patient Education  2021 Elsevier Inc.  

## 2020-02-26 NOTE — H&P (Signed)
Chief Complaint: Patient was seen in consultation today for port placement at the request of Dorsey,Cheyenne Scott  Referring Physician(s): Dorsey,Cheyenne Scott  Supervising Physician: Markus Daft  Patient Status: [facility] - Out-pt  History of Present Illness: Cheyenne Scott is a 65 y.o. female with rectal cancer. She is referred for port placement. PMHx, meds, labs, imaging, allergies reviewed. Feels well, no recent fevers, chills, illness. Has been NPO today as directed.   Past Medical History:  Diagnosis Date  . Allergy     Past Surgical History:  Procedure Laterality Date  . ANTERIOR AND POSTERIOR REPAIR N/A 01/20/2015   Procedure: ANTERIOR (CYSTOCELE) AND POSTERIOR REPAIR (RECTOCELE) WITH PERINEORRHAPHY AND UTEROSACRAL LIGAMENT  SUSPENSION ;  Surgeon: Brien Few, MD;  Location: [facility];  Service: Gynecology;  Laterality: N/A;  . ROBOTIC ASSISTED TOTAL HYSTERECTOMY WITH SALPINGECTOMY Bilateral 01/20/2015   Procedure: ROBOTIC ASSISTED TOTAL HYSTERECTOMY WITH BILATERAL SALPINGECTOMY;  Surgeon: Brien Few, MD;  Location: [facility];  Service: Gynecology;  Laterality: Bilateral;  . TUBAL LIGATION      Allergies: Penicillins  Medications: Prior to Admission medications   Medication Sig Start Date End Date Taking? Authorizing Provider  ascorbic acid (VITAMIN C) 250 MG tablet Take by mouth.    [provider]  Cholecalciferol (VITAMIN D3) 25 MCG (1000 UT) CAPS Take by mouth.    [provider]  estradiol (ESTRACE) 0.1 MG/GM vaginal cream Place vaginally.    [provider]  L-Lysine 500 MG TABS Take by mouth.    [provider]  lidocaine-prilocaine (EMLA) cream Apply 1 application topically as needed. 02/21/20   [name], MD  Multiple Vitamin (MULTIVITAMIN WITH MINERALS) TABS tablet Take 1 tablet by mouth daily.    [provider]  omeprazole (PRILOSEC OTC) 20 MG tablet Take by mouth.    [provider]  ondansetron  (ZOFRAN) 8 MG tablet Take 1 tablet (8 mg total) by mouth every 8 (eight) hours as needed for nausea or vomiting. 02/21/20   [name], MD  prochlorperazine (COMPAZINE) 10 MG tablet Take 1 tablet (10 mg total) by mouth every 6 (six) hours as needed for nausea or vomiting. 02/21/20   [name], MD  zinc gluconate 50 MG tablet Take by mouth.    [provider]     Family History  Problem Relation Age of Onset  . Diabetes Mother   . Heart disease Mother   . Cancer Brother   . Stroke Maternal Grandmother     Social History   Socioeconomic History  . Marital status: Widowed    Spouse name: Not on file  . Number of children: Not on file  . Years of education: Not on file  . Highest education level: Not on file  Occupational History  . Not on file  Tobacco Use  . Smoking status: Never Smoker  . Smokeless tobacco: Never Used  Substance and Sexual Activity  . Alcohol use: No    Alcohol/week: 0.0 standard drinks  . Drug use: No  . Sexual activity: Not on file  Other Topics Concern  . Not on file  Social History Narrative  . Not on file   Social Determinants of Health   Financial Resource Strain: Not on file  Food Insecurity: Not on file  Transportation Needs: Not on file  Physical Activity: Not on file  Stress: Not on file  Social Connections: Not on file    Review of Systems: A 12  point ROS discussed and pertinent positives are indicated in the HPI above.  All other systems are negative.  Review of Systems  Vital Signs: BP (!) 159/82   Pulse (!) 103   Temp 98.2 F (36.8 C) (Oral)   Resp 16   SpO2 100%   Physical Exam Constitutional:      General: She is not in acute distress.    Appearance: Normal appearance. She is not ill-appearing.  HENT:     Mouth/Throat:     Mouth: Mucous membranes are moist.     Pharynx: Oropharynx is clear.  Cardiovascular:     Rate and Rhythm: Normal rate and regular rhythm.     Heart sounds: Normal heart  sounds.  Pulmonary:     Effort: Pulmonary effort is normal. No respiratory distress.     Breath sounds: Normal breath sounds.  Skin:    General: Skin is warm and dry.  Neurological:     General: No focal deficit present.     Mental Status: She is alert and oriented to person, place, and time.  Psychiatric:        Mood and Affect: Mood normal.        Thought Content: Thought content normal.        Judgment: Judgment normal.      Imaging: No results found.  Labs:  CBC: Recent Labs    02/21/20 1602  WBC 10.2  HGB 13.8  HCT 41.8  PLT 274    COAGS: No results for input(s): INR, APTT in the last 8760 hours.  BMP: Recent Labs    02/21/20 1602  NA 140  K 3.4*  CL 103  CO2 26  GLUCOSE 96  BUN 14  CALCIUM 9.3  CREATININE 0.84  GFRNONAA >60    LIVER FUNCTION TESTS: Recent Labs    02/21/20 1602  BILITOT 0.6  AST 23  ALT 25  ALKPHOS 80  PROT 8.0  ALBUMIN 4.6    TUMOR MARKERS: No results for input(s): AFPTM, CEA, CA199, CHROMGRNA in the last 8760 hours.  Assessment and Plan: Rectal cancer For port placement Labs ok Risks and benefits of image guided port-a-catheter placement was discussed with the patient including, but not limited to bleeding, infection, pneumothorax, or fibrin sheath development and need for additional procedures.  All of the patient's questions were answered, patient is agreeable to proceed. Consent signed and in chart.    Thank you for this interesting consult.  I greatly enjoyed meeting Cheyenne Scott and look forward to participating in their care.  A copy of this report was sent to the requesting provider on this date.  Electronically Signed: Ascencion Dike, PA-C 02/26/2020, 1:38 PM   I spent a total of 20 minutes in face to face in clinical consultation, greater than 50% of which was counseling/coordinating care for port

## 2020-02-27 ENCOUNTER — Encounter: Payer: Self-pay | Admitting: Hematology and Oncology

## 2020-02-27 ENCOUNTER — Other Ambulatory Visit: Payer: Self-pay

## 2020-02-27 ENCOUNTER — Inpatient Hospital Stay: Payer: Medicare Other

## 2020-02-27 ENCOUNTER — Ambulatory Visit
Admission: RE | Admit: 2020-02-27 | Discharge: 2020-02-27 | Disposition: A | Discharge status: Home / Self Care | Payer: Medicare Other | Source: Ambulatory Visit | Attending: Radiation Oncology | Admitting: Radiation Oncology

## 2020-02-27 ENCOUNTER — Encounter: Payer: Self-pay | Admitting: Radiation Oncology

## 2020-02-27 VITALS — BP 139/81 | HR 94 | Temp 97.8°F | Resp 18 | Ht 63.0 in | Wt 168.0 lb

## 2020-02-27 DIAGNOSIS — C20 Malignant neoplasm of rectum: Secondary | ICD-10-CM | POA: Diagnosis present

## 2020-02-27 DIAGNOSIS — Z809 Family history of malignant neoplasm, unspecified: Secondary | ICD-10-CM | POA: Diagnosis not present

## 2020-02-27 DIAGNOSIS — Z79899 Other long term (current) drug therapy: Secondary | ICD-10-CM | POA: Insufficient documentation

## 2020-02-27 DIAGNOSIS — K219 Gastro-esophageal reflux disease without esophagitis: Secondary | ICD-10-CM | POA: Insufficient documentation

## 2020-02-27 HISTORY — DX: Gastro-esophageal reflux disease without esophagitis: K21.9

## 2020-02-27 HISTORY — DX: Malignant neoplasm of rectum: C20

## 2020-02-27 NOTE — Progress Notes (Signed)
Met with patient at registration to introduce myself as Financial Resource Specialist and to offer available resources.  Discussed one-time $1000 [employer] grant and qualifications to assist with personal expenses while going through treatment.  Gave her my card if interested in applying and for any additional financial questions or concerns.   

## 2020-02-27 NOTE — Progress Notes (Signed)
Radiation Oncology         ([phone]) [phone] ________________________________  Name: Cheyenne Scott        MRN: [id number]  Date of Service: 02/27/2020 DOB: [birth year]  [id number], No Pcp Per  [name], MD     REFERRING PHYSICIAN: [name], MD   DIAGNOSIS: The encounter diagnosis was Rectal adenocarcinoma [facility]).   HISTORY OF PRESENT ILLNESS: Cheyenne Scott is a 65 y.o. female seen at the request of Dr. [name] for a newly diagnosed rectal carcinoma. The patient had been noting rectal bleeding shortly after an EGD was performed for acid reflux. She underwent colonoscopy on 01/18/20 that showed a mass in the rectum 8 cm from the anal verge and biopsies were consistent with well differentiated adenocarcinoma. She had staging scans that were negative for metastatic disease in the chest, and MRI of the pelvis revealed the mass seen on colonoscopy and regional mesorectal adenopathy, and the tumor was staged as a T3N1 cancer. She has met with Dr. [name] and has been offered total neoadjuvant chemotherapy, followed by chemoRT, and is seen today to discuss treatment recommendations for her cancer.    PREVIOUS RADIATION THERAPY: No   PAST MEDICAL HISTORY:  Past Medical History:  Diagnosis Date  . Acid reflux   . Allergy   . Rectal cancer ([place])        PAST SURGICAL HISTORY: Past Surgical History:  Procedure Laterality Date  . ANTERIOR AND POSTERIOR REPAIR N/A 01/20/2015   Procedure: ANTERIOR (CYSTOCELE) AND POSTERIOR REPAIR (RECTOCELE) WITH PERINEORRHAPHY AND UTEROSACRAL LIGAMENT  SUSPENSION ;  Surgeon: Brien Few, MD;  Location: [facility];  Service: Gynecology;  Laterality: N/A;  . IR IMAGING GUIDED PORT INSERTION  02/26/2020  . ROBOTIC ASSISTED TOTAL HYSTERECTOMY WITH SALPINGECTOMY Bilateral 01/20/2015   Procedure: ROBOTIC ASSISTED TOTAL HYSTERECTOMY WITH BILATERAL SALPINGECTOMY;  Surgeon: Brien Few, MD;  Location: [facility];  Service: Gynecology;  Laterality: Bilateral;  .  TUBAL LIGATION       FAMILY HISTORY:  Family History  Problem Relation Age of Onset  . Diabetes Mother   . Heart disease Mother   . Cancer Brother   . Stroke Maternal Grandmother      SOCIAL HISTORY:  reports that she has never smoked. She has never used smokeless tobacco. She reports that she does not drink alcohol and does not use drugs. The patient is widowed and lives in [place] after relocating from [place]. Her daughter in law Magda Paganini accompanies her and is a PACU nurse in the [employer].    ALLERGIES: Penicillins   MEDICATIONS:  Current Outpatient Medications  Medication Sig Dispense Refill  . ascorbic acid (VITAMIN C) 250 MG tablet Take by mouth.    . Cholecalciferol (VITAMIN D3) 25 MCG (1000 UT) CAPS Take by mouth.    . estradiol (ESTRACE) 0.1 MG/GM vaginal cream Place vaginally.    [name] L-Lysine 500 MG TABS Take by mouth.    . lidocaine-prilocaine (EMLA) cream Apply 1 application topically as needed. 30 g 0  . Multiple Vitamin (MULTIVITAMIN WITH MINERALS) TABS tablet Take 1 tablet by mouth daily.    [name] omeprazole (PRILOSEC OTC) 20 MG tablet Take by mouth.    . ondansetron (ZOFRAN) 8 MG tablet Take 1 tablet (8 mg total) by mouth every 8 (eight) hours as needed for nausea or vomiting. 30 tablet 0  . prochlorperazine (COMPAZINE) 10 MG tablet Take 1 tablet (10 mg total) by mouth every 6 (six) hours as needed for nausea or vomiting.  30 tablet 0  . zinc gluconate 50 MG tablet Take by mouth.     No current facility-administered medications for this encounter.     REVIEW OF SYSTEMS: On review of systems, the patient reports that she is doing well overall. She does have pain passing stool if she gets constipated. She still notes blood per rectum and hematochezia but not with all bowel movements. She denies any bladder disturbances, and denies abdominal pain, nausea or vomiting. No other complaints are verbalized.     PHYSICAL EXAM:  Wt Readings from Last 3 Encounters:  02/27/20 168  lb (76.2 kg)  02/26/20 163 lb (73.9 kg)  02/21/20 165 lb 14.4 oz (75.3 kg)   Temp Readings from Last 3 Encounters:  02/27/20 97.8 F (36.6 C)  02/26/20 97.9 F (36.6 C) (Oral)  02/21/20 97.9 F (36.6 C) (Tympanic)   BP Readings from Last 3 Encounters:  02/27/20 139/81  02/26/20 120/66  02/21/20 (!) 164/69   Pulse Readings from Last 3 Encounters:  02/27/20 94  02/26/20 82  02/21/20 88   Pain Assessment Pain Score: 0-No pain/10  In general this is a well appearing caucasian female in no acute distress. She's alert and oriented x4 and appropriate throughout the examination. Cardiopulmonary assessment is negative for acute distress and she exhibits normal effort.    ECOG = 1  0 - Asymptomatic (Fully active, able to carry on all predisease activities without restriction)  1 - Symptomatic but completely ambulatory (Restricted in physically strenuous activity but ambulatory and able to carry out work of a light or sedentary nature. For example, light housework, office work)  2 - Symptomatic, <50% in bed during the day (Ambulatory and capable of all self care but unable to carry out any work activities. Up and about more than 50% of waking hours)  3 - Symptomatic, >50% in bed, but not bedbound (Capable of only limited self-care, confined to bed or chair 50% or more of waking hours)  4 - Bedbound (Completely disabled. Cannot carry on any self-care. Totally confined to bed or chair)  5 - Death   Eustace Pen MM, Creech RH, Tormey DC, et al. [phone]). "Toxicity and response criteria of the [facility]". [place] Oncol. 5 (6): 649-55    LABORATORY DATA:  Lab Results  Component Value Date   WBC 10.2 02/21/2020   HGB 13.8 02/21/2020   HCT 41.8 02/21/2020   MCV 90.5 02/21/2020   PLT 274 02/21/2020   Lab Results  Component Value Date   NA 140 02/21/2020   K 3.4 (L) 02/21/2020   CL 103 02/21/2020   CO2 26 02/21/2020   Lab Results  Component Value Date    ALT 25 02/21/2020   AST 23 02/21/2020   ALKPHOS 80 02/21/2020   BILITOT 0.6 02/21/2020      RADIOGRAPHY: IR IMAGING GUIDED PORT INSERTION  Result Date: 02/26/2020 INDICATION: 65 year old with rectal cancer. Port-A-Cath needed for chemotherapy. EXAM: FLUOROSCOPIC AND ULTRASOUND GUIDED PLACEMENT OF A SUBCUTANEOUS PORT COMPARISON:  None. MEDICATIONS: Cleocin 900 mg; The antibiotic was administered within an appropriate time interval prior to skin puncture. ANESTHESIA/SEDATION: Versed 3.0 mg IV; Fentanyl 100 mcg IV; Moderate Sedation Time:  29 minutes The patient was continuously monitored during the procedure by the interventional radiology nurse under my direct supervision. FLUOROSCOPY TIME:  18 seconds, 2 mGy COMPLICATIONS: None immediate. PROCEDURE: The procedure, risks, benefits, and alternatives were explained to the patient. Questions regarding the procedure were encouraged and answered. The  patient understands and consents to the procedure. Patient was placed supine on the interventional table. Ultrasound confirmed a patent right internal jugular vein. Ultrasound image was saved for documentation. The right chest and neck were cleaned with a skin antiseptic and a sterile drape was placed. Maximal barrier sterile technique was utilized including caps, mask, sterile gowns, sterile gloves, sterile drape, hand hygiene and skin antiseptic. The right neck was anesthetized with 1% lidocaine. Small incision was made in the right neck with a blade. Micropuncture set was placed in the right internal jugular vein with ultrasound guidance. The micropuncture wire was used for measurement purposes. The right chest was anesthetized with 1% lidocaine with epinephrine. #15 blade was used to make an incision and a subcutaneous port pocket was formed. [place] was assembled. Subcutaneous tunnel was formed with a stiff tunneling device. The port catheter was brought through the subcutaneous tunnel. The port was  placed in the subcutaneous pocket and sutured in place using Ethilon suture. The micropuncture set was exchanged for a peel-away sheath. The catheter was placed through the peel-away sheath and the tip was positioned at the superior cavoatrial junction. Catheter placement was confirmed with fluoroscopy. The port was accessed and flushed with heparinized saline. The port pocket was closed using two layers of absorbable sutures and Dermabond. The vein skin site was closed using a single layer of absorbable suture and Dermabond. Sterile dressings were applied. Patient tolerated the procedure well without an immediate complication. Ultrasound and fluoroscopic images were taken and saved for this procedure. IMPRESSION: Placement of a subcutaneous port device. Catheter tip at the superior cavoatrial junction. Electronically Signed   By: Markus Daft M.D.   On: 02/26/2020 16:57       IMPRESSION/PLAN: 1. Stage IIIB, cT3N1M0 adenocarcinoma of the rectum. Dr. Lisbeth Renshaw discusses the pathology findings and reviews the nature of rectal cancer and the treatment strategy of neoadjuvant chemo, followed by chemoRT, and then surgical resection. She will consider her surgical options as well but understands she really needs to decide who she wants to see so she can be examined prior to starting treatment. We discussed the risks, benefits, short, and long term effects of radiotherapy, as well as the curative intent, and the patient is interested in proceeding. Dr. Lisbeth Renshaw discusses the delivery and logistics of radiotherapy and anticipates a course of 5 1/2 weeks of radiotherapy to the rectum and regional nodes. We will see her back in about 4 months just at the completion of chemotherapy to coordinate chemoRT. She was encouraged to call sooner if she has questions prior that time.   In a visit lasting 60 minutes, greater than 50% of the time was spent face to face discussing the patient's condition, in preparation for the discussion,  and coordinating the patient's care.  The above documentation reflects my direct findings during this shared patient visit. Please see the separate note by Dr. Lisbeth Renshaw on this date for the remainder of the patient's plan of care.    Carola Rhine, PAC

## 2020-02-27 NOTE — Progress Notes (Signed)
Pharmacist Chemotherapy Monitoring - Initial Assessment    Anticipated start date: 03/05/20  Regimen:  . Are orders appropriate based on the patient's diagnosis, regimen, and cycle? Yes . Does the plan date match the patient's scheduled date? Yes . Is the sequencing of drugs appropriate? Yes . Are the premedications appropriate for the patient's regimen? Yes . Prior Authorization for treatment is: Approved o If applicable, is the correct biosimilar selected based on the patient's insurance? not applicable  Organ Function and Labs: [name] Are dose adjustments needed based on the patient's renal function, hepatic function, or hematologic function? No . Are appropriate labs ordered prior to the start of patient's treatment? Yes . Other organ system assessment, if indicated: N/A . The following baseline labs, if indicated, have been ordered: N/A  Dose Assessment: . Are the drug doses appropriate? Yes . Are the following correct: o Drug concentrations Yes o IV fluid compatible with drug Yes o Administration routes Yes o Timing of therapy Yes . If applicable, does the patient have documented access for treatment and/or plans for port-a-cath placement? yes . If applicable, have lifetime cumulative doses been properly documented and assessed? not applicable Lifetime Dose Tracking  No doses have been documented on this patient for the following tracked chemicals: Doxorubicin, Epirubicin, Idarubicin, Daunorubicin, Mitoxantrone, Bleomycin, Oxaliplatin, Carboplatin, Liposomal Doxorubicin  o   Toxicity Monitoring/Prevention: . The patient has the following take home antiemetics prescribed: Ondansetron and Prochlorperazine . The patient has the following take home medications prescribed: N/A . Medication allergies and previous infusion related reactions, if applicable, have been reviewed and addressed. Yes . The patient's current medication list has been assessed for drug-drug interactions with their  chemotherapy regimen. no significant drug-drug interactions were identified on review.  Order Review: . Are the treatment plan orders signed? No . Is the patient scheduled to see a provider prior to their treatment? Yes  I verify that I have reviewed each item in the above checklist and answered each question accordingly.  Romualdo Bolk [facility] 02/27/2020 8:32 AM

## 2020-02-28 ENCOUNTER — Encounter: Payer: Self-pay | Admitting: General Practice

## 2020-02-28 NOTE — Progress Notes (Signed)
[facility] Psychosocial Distress Screening Clinical Social Work  Clinical Social Work was referred by distress screening protocol.  The patient scored a 7 on the Psychosocial Distress Thermometer which indicates moderate distress. Clinical Social Worker contacted patient by phone to assess for distress and other psychosocial needs. Unable to reach, left generic VM as VM was not personally identified.  Will call again.    [facility] DISTRESS SCREENING 02/27/2020  Screening Type Initial Screening  Distress experienced in past week (1-10) 7  Emotional problem type Nervousness/Anxiety;Adjusting to illness  Other Contact via phone    Clinical Social Worker follow up needed: Yes.    If yes, follow up plan:  Call again  Beverely Pace, [place], [place] Worker Phone:  [phone]

## 2020-02-29 ENCOUNTER — Telehealth: Payer: Self-pay | Admitting: General Practice

## 2020-02-29 NOTE — Telephone Encounter (Signed)
[facility] CSW Progress Notes  Second call to patient per Distress Screen referral, unable to reach, left generic VM w my contact information and encouragement to return call.  Edwyna Shell, LCSW Clinical Social Worker Phone:  [phone]

## 2020-03-04 ENCOUNTER — Other Ambulatory Visit: Payer: Self-pay | Admitting: Hematology and Oncology

## 2020-03-04 ENCOUNTER — Encounter: Payer: Self-pay | Admitting: *Deleted

## 2020-03-04 DIAGNOSIS — C20 Malignant neoplasm of rectum: Secondary | ICD-10-CM

## 2020-03-04 NOTE — Progress Notes (Signed)
[facility] Work  Clinical Social Work made 3rd attempt to contact patient for distress screen referral.  CSW left a voicemail encouraging patient to return call.  Johnnye Lana, MSW, LCSW, OSW-C Clinical Social Worker [facility] [phone]

## 2020-03-04 NOTE — Progress Notes (Signed)
[place] [phone]   [phone]  PROGRESS NOTE  Patient Care Team: Patient, No Pcp Per as PCP - General (General Practice) [name], RN as Oncology Nurse Navigator [name], MD as Consulting Physician (Hematology and Oncology)  Hematological/Oncological History # Rectal Adenocarcainoma, Stage T3N1M0 1) 01/18/2020: colonoscopy due to bright red blood per rectum. A mass was found in the rectum 8 cm from the anal verge. Biopsy confirms moderately differentiated adenocarcinoma.  2) 01/29/2020: CT chest showed no evidence of metastatic disease. MRI showed a T2-T3 malignancy that appears to involve the internal sphincter and 2 mesorectal nodes 3) 02/07/2020: seen at [facility] by Oncologist Dr. Georgiann Cocker 4) 02/11/2020: seen at [facility] by Dr. [name] 5) 02/21/2020: establish care with Dr. [name]  6) 03/05/2020: Start of Neoadjuvant FOLFOX (anticipate 12-16 weeks of chemotherapy)   Interval History:  Cheyenne Scott 65 y.o. female with medical history significant for rectal cancer who presents for a follow up visit. The patient's last visit was on 02/21/2020. In the interim since the last visit Mrs. Outwater has undergone chemotherapy education and port placement.   On exam today Mrs. Zupko's that she has been well in the interim since her last visit.  She has successfully had the port placed and notes that it is settling in well.  She is also had chemotherapy education which answered most of her questions about treatment moving forward.  She also has picked up her pretreatment medications and understands how to use them.  She does note that she has increased anxiety today and that her blood pressure has been running higher than usual.  She has not been having any issues with fatigue, shortness of breath, or nausea vomiting.  She does still have some blood in her stool but has not increased in frequency or amount.  She  otherwise denies any fevers, chills, sweats, nausea vomiting or diarrhea.  Full 10 point ROS is listed below.  The patient is willing and able to proceed with treatment today.  MEDICAL HISTORY:  Past Medical History:  Diagnosis Date  . Acid reflux   . Allergy   . Rectal cancer [facility])     SURGICAL HISTORY: Past Surgical History:  Procedure Laterality Date  . ANTERIOR AND POSTERIOR REPAIR N/A 01/20/2015   Procedure: ANTERIOR (CYSTOCELE) AND POSTERIOR REPAIR (RECTOCELE) WITH PERINEORRHAPHY AND UTEROSACRAL LIGAMENT  SUSPENSION ;  Surgeon: Brien Few, MD;  Location: [facility];  Service: Gynecology;  Laterality: N/A;  . IR IMAGING GUIDED PORT INSERTION  02/26/2020  . ROBOTIC ASSISTED TOTAL HYSTERECTOMY WITH SALPINGECTOMY Bilateral 01/20/2015   Procedure: ROBOTIC ASSISTED TOTAL HYSTERECTOMY WITH BILATERAL SALPINGECTOMY;  Surgeon: Brien Few, MD;  Location: [facility];  Service: Gynecology;  Laterality: Bilateral;  . TUBAL LIGATION      SOCIAL HISTORY: Social History   Socioeconomic History  . Marital status: Widowed    Spouse name: Not on file  . Number of children: Not on file  . Years of education: Not on file  . Highest education level: Not on file  Occupational History  . Not on file  Tobacco Use  . Smoking status: Never Smoker  . Smokeless tobacco: Never Used  Substance and Sexual Activity  . Alcohol use: No    Alcohol/week: 0.0 standard drinks  . Drug use: No  . Sexual activity: Not on file  Other Topics Concern  . Not on file  Social History Narrative  . Not on file   Social Determinants of  Health   Financial Resource Strain: Not on file  Food Insecurity: Not on file  Transportation Needs: Not on file  Physical Activity: Not on file  Stress: Not on file  Social Connections: Not on file  Intimate Partner Violence: Not on file    FAMILY HISTORY: Family History  Problem Relation Age of Onset  . Diabetes Mother   . Heart disease Mother   . Cancer Brother   .  Stroke Maternal Grandmother     ALLERGIES:  is allergic to penicillins.  MEDICATIONS:  Current Outpatient Medications  Medication Sig Dispense Refill  . ascorbic acid (VITAMIN C) 250 MG tablet Take by mouth.    . Cholecalciferol (VITAMIN D3) 25 MCG (1000 UT) CAPS Take by mouth.    . estradiol (ESTRACE) 0.1 MG/GM vaginal cream Place vaginally.    [name] L-Lysine 500 MG TABS Take by mouth.    . lidocaine-prilocaine (EMLA) cream Apply 1 application topically as needed. 30 g 0  . Multiple Vitamin (MULTIVITAMIN WITH MINERALS) TABS tablet Take 1 tablet by mouth daily.    [name] omeprazole (PRILOSEC OTC) 20 MG tablet Take by mouth.    . ondansetron (ZOFRAN) 8 MG tablet Take 1 tablet (8 mg total) by mouth every 8 (eight) hours as needed for nausea or vomiting. 30 tablet 0  . prochlorperazine (COMPAZINE) 10 MG tablet Take 1 tablet (10 mg total) by mouth every 6 (six) hours as needed for nausea or vomiting. 30 tablet 0  . zinc gluconate 50 MG tablet Take by mouth.     No current facility-administered medications for this visit.   Facility-Administered Medications Ordered in Other Visits  Medication Dose Route Frequency Provider Last Rate Last Admin  . sodium chloride flush (NS) 0.9 % injection 10 mL  10 mL Intravenous PRN [name], MD   10 mL at 03/05/20 0805    REVIEW OF SYSTEMS:   Constitutional: ( - ) fevers, ( - )  chills , ( - ) night sweats Eyes: ( - ) blurriness of vision, ( - ) double vision, ( - ) watery eyes Ears, nose, mouth, throat, and face: ( - ) mucositis, ( - ) sore throat Respiratory: ( - ) cough, ( - ) dyspnea, ( - ) wheezes Cardiovascular: ( - ) palpitation, ( - ) chest discomfort, ( - ) lower extremity swelling Gastrointestinal:  ( - ) nausea, ( - ) heartburn, ( - ) change in bowel habits Skin: ( - ) abnormal skin rashes Lymphatics: ( - ) new lymphadenopathy, ( - ) easy bruising Neurological: ( - ) numbness, ( - ) tingling, ( - ) new weaknesses Behavioral/Psych: ( - )  mood change, ( - ) new changes  All other systems were reviewed with the patient and are negative.  PHYSICAL EXAMINATION: ECOG PERFORMANCE STATUS: 1 - Symptomatic but completely ambulatory  Vitals:   03/05/20 0824  BP: (!) 147/78  Pulse: 88  Resp: 17  Temp: (!) 96.8 F (36 C)  SpO2: 100%   Filed Weights   03/05/20 0824  Weight: 166 lb 14.4 oz (75.7 kg)    GENERAL: well appearing middle aged Caucasian female alert, no distress and comfortable SKIN: skin color, texture, turgor are normal, no rashes or significant lesions EYES: conjunctiva are pink and non-injected, sclera clear LUNGS: clear to auscultation and percussion with normal breathing effort HEART: regular rate & rhythm and no murmurs and no lower extremity edema Musculoskeletal: no cyanosis of digits and no clubbing  PSYCH:  alert & oriented x 3, fluent speech NEURO: no focal motor/sensory deficits  LABORATORY DATA:  I have reviewed the data as listed CBC Latest Ref Rng & Units 03/05/2020 02/21/2020 01/21/2015  WBC 4.0 - 10.5 K/uL 7.1 10.2 11.3(H)  Hemoglobin 12.0 - 15.0 g/dL 12.8 13.8 11.7(L)  Hematocrit 36.0 - 46.0 % 39.1 41.8 35.7(L)  Platelets 150 - 400 K/uL 214 274 253    CMP Latest Ref Rng & Units 03/05/2020 02/21/2020 01/21/2015  Glucose 70 - 99 mg/dL 110(H) 96 148(H)  BUN 8 - 23 mg/dL 13 14 9   Creatinine 0.44 - 1.00 mg/dL 0.86 0.84 0.88  Sodium 135 - 145 mmol/L 142 140 137  Potassium 3.5 - 5.1 mmol/L 3.6 3.4(L) 3.5  Chloride 98 - 111 mmol/L 109 103 101  CO2 22 - 32 mmol/L 24 26 29   Calcium 8.9 - 10.3 mg/dL 9.2 9.3 8.9  Total Protein 6.5 - 8.1 g/dL 7.6 8.0 -  Total Bilirubin 0.3 - 1.2 mg/dL 0.6 0.6 -  Alkaline Phos 38 - 126 U/L 81 80 -  AST 15 - 41 U/L 24 23 -  ALT 0 - 44 U/L 17 25 -     RADIOGRAPHIC STUDIES: IR IMAGING GUIDED PORT INSERTION  Result Date: 02/26/2020 INDICATION: 65 year old with rectal cancer. Port-A-Cath needed for chemotherapy. EXAM: FLUOROSCOPIC AND ULTRASOUND GUIDED PLACEMENT OF A  SUBCUTANEOUS PORT COMPARISON:  None. MEDICATIONS: Cleocin 900 mg; The antibiotic was administered within an appropriate time interval prior to skin puncture. ANESTHESIA/SEDATION: Versed 3.0 mg IV; Fentanyl 100 mcg IV; Moderate Sedation Time:  29 minutes The patient was continuously monitored during the procedure by the interventional radiology nurse under my direct supervision. FLUOROSCOPY TIME:  18 seconds, 2 mGy COMPLICATIONS: None immediate. PROCEDURE: The procedure, risks, benefits, and alternatives were explained to the patient. Questions regarding the procedure were encouraged and answered. The patient understands and consents to the procedure. Patient was placed supine on the interventional table. Ultrasound confirmed a patent right internal jugular vein. Ultrasound image was saved for documentation. The right chest and neck were cleaned with a skin antiseptic and a sterile drape was placed. Maximal barrier sterile technique was utilized including caps, mask, sterile gowns, sterile gloves, sterile drape, hand hygiene and skin antiseptic. The right neck was anesthetized with 1% lidocaine. Small incision was made in the right neck with a blade. Micropuncture set was placed in the right internal jugular vein with ultrasound guidance. The micropuncture wire was used for measurement purposes. The right chest was anesthetized with 1% lidocaine with epinephrine. #15 blade was used to make an incision and a subcutaneous port pocket was formed. [place] was assembled. Subcutaneous tunnel was formed with a stiff tunneling device. The port catheter was brought through the subcutaneous tunnel. The port was placed in the subcutaneous pocket and sutured in place using Ethilon suture. The micropuncture set was exchanged for a peel-away sheath. The catheter was placed through the peel-away sheath and the tip was positioned at the superior cavoatrial junction. Catheter placement was confirmed with fluoroscopy. The  port was accessed and flushed with heparinized saline. The port pocket was closed using two layers of absorbable sutures and Dermabond. The vein skin site was closed using a single layer of absorbable suture and Dermabond. Sterile dressings were applied. Patient tolerated the procedure well without an immediate complication. Ultrasound and fluoroscopic images were taken and saved for this procedure. IMPRESSION: Placement of a subcutaneous port device. Catheter tip at the superior cavoatrial junction. Electronically Signed  By: Markus Daft M.D.   On: 02/26/2020 16:57    ASSESSMENT & PLAN Neka Bise 65 y.o. female with medical history significant for rectal cancer who presents for a follow up visit.  We will plan to treat the patient with FOLFOX chemotherapy every 2 weeks x8 cycles, for total of 16 weeks for the treatment.  Then we will start chemoradiation with capecitabine 825 mg per metered squared twice daily on days of radiation, typically for about 28 fractions.  Mrs. Simek established with Dr. Lisbeth Renshaw who will be performing the radiation component of her treatment.  Once this is complete we will reevaluate with repeat imaging in proceed to surgery if feasible.  The patient and her daughter-in-law voiced their understanding of this plan moving forward.  Previously we discussed the FOLFOX chemotherapy treatment and the expected side effects including nausea, vomiting, diarrhea, neutropenia, nerve pain, and hand-foot syndrome.  Discussed that these are the most common and dangerous side effects, but is not limited to these alone.  The patient was agreeable to proceeding with FOLFOX chemotherapy at this time.  We will plan for chemotherapy education as well as port placement as soon as possible.  # Rectal Adenocarcainoma, Stage T3N1M0 -- Findings consistent with a stage III T3 N1 M0 rectal adenocarcinoma.  Treatment of choice would be total neoadjuvant chemotherapy followed by surgical  resection. -- Start Cycle 1 Day 1 of FOLFOX chemotherapy today, 03/05/2020.   -- initial pretreatment CEA found to be within normal limits, not likely to be of value moving forward.  --monitor CBC, CMP, and magnesium with each cycle. --RTC in 2 weeks for Cycle 2 of FOLFOX.   #Supportive Care --chemotherapy education completed --zofran 8mg  q8H PRN and compazine 10mg  PO q6H for nausea -- EMLA cream for port -- no pain medication required at this time.   No orders of the defined types were placed in this encounter.  All questions were answered. The patient knows to call the clinic with any problems, questions or concerns.  A total of more than 30 minutes were spent on this encounter and over half of that time was spent on counseling and coordination of care as outlined above.   Ledell Peoples, MD Department of Hematology/Oncology [place] [facility] Phone: [phone] Pager: [phone] Email: [email or web address]  03/05/2020 8:47 AM

## 2020-03-05 ENCOUNTER — Inpatient Hospital Stay (HOSPITAL_BASED_OUTPATIENT_CLINIC_OR_DEPARTMENT_OTHER): Payer: Medicare Other | Admitting: Hematology and Oncology

## 2020-03-05 ENCOUNTER — Inpatient Hospital Stay: Payer: Medicare Other

## 2020-03-05 ENCOUNTER — Other Ambulatory Visit: Payer: Self-pay

## 2020-03-05 VITALS — BP 147/78 | HR 88 | Temp 96.8°F | Resp 17 | Ht 63.0 in | Wt 166.9 lb

## 2020-03-05 DIAGNOSIS — D500 Iron deficiency anemia secondary to blood loss (chronic): Secondary | ICD-10-CM

## 2020-03-05 DIAGNOSIS — C20 Malignant neoplasm of rectum: Secondary | ICD-10-CM

## 2020-03-05 DIAGNOSIS — Z5111 Encounter for antineoplastic chemotherapy: Secondary | ICD-10-CM | POA: Diagnosis not present

## 2020-03-05 DIAGNOSIS — Z95828 Presence of other vascular implants and grafts: Secondary | ICD-10-CM

## 2020-03-05 LAB — CMP (CANCER CENTER ONLY)
ALT: 17 U/L (ref 0–44)
AST: 24 U/L (ref 15–41)
Albumin: 3.8 g/dL (ref 3.5–5.0)
Alkaline Phosphatase: 81 U/L (ref 38–126)
Anion gap: 9 (ref 5–15)
BUN: 13 mg/dL (ref 8–23)
CO2: 24 mmol/L (ref 22–32)
Calcium: 9.2 mg/dL (ref 8.9–10.3)
Chloride: 109 mmol/L (ref 98–111)
Creatinine: 0.86 mg/dL (ref 0.44–1.00)
GFR, Estimated: >60 mL/min (ref >60)
Glucose, Bld: 110 mg/dL — ABNORMAL HIGH (ref 70–99)
Potassium: 3.6 mmol/L (ref 3.5–5.1)
Sodium: 142 mmol/L (ref 135–145)
Total Bilirubin: 0.6 mg/dL (ref 0.3–1.2)
Total Protein: 7.6 g/dL (ref 6.5–8.1)

## 2020-03-05 LAB — CBC WITH DIFFERENTIAL (CANCER CENTER ONLY)
Abs Immature Granulocytes: 0.02 10*3/uL (ref 0.00–0.07)
Basophils Absolute: 0.2 10*3/uL — ABNORMAL HIGH (ref 0.0–0.1)
Basophils Relative: 3 %
Eosinophils Absolute: 0.5 10*3/uL (ref 0.0–0.5)
Eosinophils Relative: 7 %
HCT: 39.1 % (ref 36.0–46.0)
Hemoglobin: 12.8 g/dL (ref 12.0–15.0)
Immature Granulocytes: 0 %
Lymphocytes Relative: 29 %
Lymphs Abs: 2.1 10*3/uL (ref 0.7–4.0)
MCH: 29.5 pg (ref 26.0–34.0)
MCHC: 32.7 g/dL (ref 30.0–36.0)
MCV: 90.1 fL (ref 80.0–100.0)
Monocytes Absolute: 0.6 10*3/uL (ref 0.1–1.0)
Monocytes Relative: 8 %
Neutro Abs: 3.7 10*3/uL (ref 1.7–7.7)
Neutrophils Relative %: 53 %
Platelet Count: 214 10*3/uL (ref 150–400)
RBC: 4.34 MIL/uL (ref 3.87–5.11)
RDW: 13 % (ref 11.5–15.5)
WBC Count: 7.1 10*3/uL (ref 4.0–10.5)
nRBC: 0 % (ref 0.0–0.2)

## 2020-03-05 LAB — LACTATE DEHYDROGENASE: LDH: 173 U/L (ref 98–192)

## 2020-03-05 LAB — MAGNESIUM: Magnesium: 2.1 mg/dL (ref 1.7–2.4)

## 2020-03-05 MED ORDER — FLUOROURACIL CHEMO INJECTION 5 GM/100ML
2400.0000 mg/m2 | INTRAVENOUS | Status: DC
  Administered 2020-03-05: 4300 mg via INTRAVENOUS
  Filled 2020-03-05: qty 86

## 2020-03-05 MED ORDER — OXALIPLATIN CHEMO INJECTION 100 MG/20ML
83.0000 mg/m2 | Freq: Once | INTRAVENOUS | Status: AC
  Administered 2020-03-05: 150 mg via INTRAVENOUS
  Filled 2020-03-05: qty 30

## 2020-03-05 MED ORDER — DEXTROSE 5 % IV SOLN
Freq: Once | INTRAVENOUS | Status: AC
  Filled 2020-03-05: qty 250

## 2020-03-05 MED ORDER — LEUCOVORIN CALCIUM INJECTION 350 MG
400.0000 mg/m2 | Freq: Once | INTRAVENOUS | Status: AC
  Administered 2020-03-05: 720 mg via INTRAVENOUS
  Filled 2020-03-05: qty 36

## 2020-03-05 MED ORDER — SODIUM CHLORIDE 0.9 % IV SOLN
10.0000 mg | Freq: Once | INTRAVENOUS | Status: AC
  Administered 2020-03-05: 10 mg via INTRAVENOUS
  Filled 2020-03-05: qty 10

## 2020-03-05 MED ORDER — SODIUM CHLORIDE 0.9% FLUSH
10.0000 mL | INTRAVENOUS | Status: DC | PRN
  Administered 2020-03-05: 10 mL via INTRAVENOUS
  Filled 2020-03-05: qty 10

## 2020-03-05 MED ORDER — PALONOSETRON HCL INJECTION 0.25 MG/5ML
INTRAVENOUS | Status: AC
  Filled 2020-03-05: qty 5

## 2020-03-05 MED ORDER — FLUOROURACIL CHEMO INJECTION 2.5 GM/50ML
400.0000 mg/m2 | Freq: Once | INTRAVENOUS | Status: AC
  Administered 2020-03-05: 700 mg via INTRAVENOUS
  Filled 2020-03-05: qty 14

## 2020-03-05 MED ORDER — DEXTROSE 5 % IV SOLN: 85.0000 mg/m2 | Freq: Once | INTRAVENOUS | Status: DC

## 2020-03-05 MED ORDER — PALONOSETRON HCL INJECTION 0.25 MG/5ML
0.2500 mg | Freq: Once | INTRAVENOUS | Status: AC
  Administered 2020-03-05: 0.25 mg via INTRAVENOUS

## 2020-03-05 NOTE — Patient Instructions (Addendum)
[facility] Discharge Instructions for Patients Receiving Chemotherapy  Today you received the following chemotherapy agents: Oxaliplatin, Leucovorin, 5FU  To help prevent nausea and vomiting after your treatment, we encourage you to take your nausea medication as directed.   If you develop nausea and vomiting that is not controlled by your nausea medication, call the clinic.   BELOW ARE SYMPTOMS THAT SHOULD BE REPORTED IMMEDIATELY:  *FEVER GREATER THAN 100.5 F  *CHILLS WITH OR WITHOUT FEVER  NAUSEA AND VOMITING THAT IS NOT CONTROLLED WITH YOUR NAUSEA MEDICATION  *UNUSUAL SHORTNESS OF BREATH  *UNUSUAL BRUISING OR BLEEDING  TENDERNESS IN MOUTH AND THROAT WITH OR WITHOUT PRESENCE OF ULCERS  *URINARY PROBLEMS  *BOWEL PROBLEMS  UNUSUAL RASH Items with * indicate a potential emergency and should be followed up as soon as possible.  Feel free to call the clinic should you have any questions or concerns. The clinic phone number is [phone].  Please show the CHEMO ALERT CARD at check-in to the Emergency Department and triage nurse.  Oxaliplatin Injection What is this medicine? OXALIPLATIN (ox AL i PLA tin) is a chemotherapy drug. It targets fast dividing cells, like cancer cells, and causes these cells to die. This medicine is used to treat cancers of the colon and rectum, and many other cancers. This medicine may be used for other purposes; ask your health care provider or pharmacist if you have questions. COMMON BRAND NAME(S): Eloxatin What should I tell my health care provider before I take this medicine? They need to know if you have any of these conditions:  heart disease  history of irregular heartbeat  liver disease  low blood counts, like white cells, platelets, or red blood cells  lung or breathing disease, like asthma  take medicines that treat or prevent blood clots  tingling of the fingers or toes, or other nerve disorder  an unusual or  allergic reaction to oxaliplatin, other chemotherapy, other medicines, foods, dyes, or preservatives  pregnant or trying to get pregnant  breast-feeding How should I use this medicine? This drug is given as an infusion into a vein. It is administered in a hospital or clinic by a specially trained health care professional. Talk to your pediatrician regarding the use of this medicine in children. Special care may be needed. Overdosage: If you think you have taken too much of this medicine contact a poison control center or emergency room at once. NOTE: This medicine is only for you. Do not share this medicine with others. What if I miss a dose? It is important not to miss a dose. Call your doctor or health care professional if you are unable to keep an appointment. What may interact with this medicine? Do not take this medicine with any of the following medications:  cisapride  dronedarone  pimozide  thioridazine This medicine may also interact with the following medications:  aspirin and aspirin-like medicines  certain medicines that treat or prevent blood clots like warfarin, apixaban, dabigatran, and rivaroxaban  cisplatin  cyclosporine  diuretics  medicines for infection like acyclovir, adefovir, amphotericin B, bacitracin, cidofovir, foscarnet, ganciclovir, gentamicin, pentamidine, vancomycin  NSAIDs, medicines for pain and inflammation, like ibuprofen or naproxen  other medicines that prolong the QT interval (an abnormal heart rhythm)  pamidronate  zoledronic acid This list may not describe all possible interactions. Give your health care provider a list of all the medicines, herbs, non-prescription drugs, or dietary supplements you use. Also tell them if you smoke, drink alcohol,   or use illegal drugs. Some items may interact with your medicine. What should I watch for while using this medicine? Your condition will be monitored carefully while you are receiving this  medicine. You may need blood work done while you are taking this medicine. This medicine may make you feel generally unwell. This is not uncommon as chemotherapy can affect healthy cells as well as cancer cells. Report any side effects. Continue your course of treatment even though you feel ill unless your healthcare professional tells you to stop. This medicine can make you more sensitive to cold. Do not drink cold drinks or use ice. Cover exposed skin before coming in contact with cold temperatures or cold objects. When out in cold weather wear warm clothing and cover your mouth and nose to warm the air that goes into your lungs. Tell your doctor if you get sensitive to the cold. Do not become pregnant while taking this medicine or for 9 months after stopping it. Women should inform their health care professional if they wish to become pregnant or think they might be pregnant. Men should not father a child while taking this medicine and for 6 months after stopping it. There is potential for serious side effects to an unborn child. Talk to your health care professional for more information. Do not breast-feed a child while taking this medicine or for 3 months after stopping it. This medicine has caused ovarian failure in some women. This medicine may make it more difficult to get pregnant. Talk to your health care professional if you are concerned about your fertility. This medicine has caused decreased sperm counts in some men. This may make it more difficult to father a child. Talk to your health care professional if you are concerned about your fertility. This medicine may increase your risk of getting an infection. Call your health care professional for advice if you get a fever, chills, or sore throat, or other symptoms of a cold or flu. Do not treat yourself. Try to avoid being around people who are sick. Avoid taking medicines that contain aspirin, acetaminophen, ibuprofen, naproxen, or ketoprofen  unless instructed by your health care professional. These medicines may hide a fever. Be careful brushing or flossing your teeth or using a toothpick because you may get an infection or bleed more easily. If you have any dental work done, tell your dentist you are receiving this medicine. What side effects may I notice from receiving this medicine? Side effects that you should report to your doctor or health care professional as soon as possible:  allergic reactions like skin rash, itching or hives, swelling of the face, lips, or tongue  breathing problems  cough  low blood counts - this medicine may decrease the number of white blood cells, red blood cells, and platelets. You may be at increased risk for infections and bleeding  nausea, vomiting  pain, redness, or irritation at site where injected  pain, tingling, numbness in the hands or feet  signs and symptoms of bleeding such as bloody or black, tarry stools; red or dark brown urine; spitting up blood or brown material that looks like coffee grounds; red spots on the skin; unusual bruising or bleeding from the eyes, gums, or nose  signs and symptoms of a dangerous change in heartbeat or heart rhythm like chest pain; dizziness; fast, irregular heartbeat; palpitations; feeling faint or lightheaded; falls  signs and symptoms of infection like fever; chills; cough; sore throat; pain or trouble passing urine    signs and symptoms of liver injury like dark yellow or brown urine; general ill feeling or flu-like symptoms; light-colored stools; loss of appetite; nausea; right upper belly pain; unusually weak or tired; yellowing of the eyes or skin  signs and symptoms of low red blood cells or anemia such as unusually weak or tired; feeling faint or lightheaded; falls  signs and symptoms of muscle injury like dark urine; trouble passing urine or change in the amount of urine; unusually weak or tired; muscle pain; back pain Side effects that  usually do not require medical attention (report to your doctor or health care professional if they continue or are bothersome):  changes in taste  diarrhea  gas  hair loss  loss of appetite  mouth sores This list may not describe all possible side effects. Call your doctor for medical advice about side effects. You may report side effects to FDA at 1-800-FDA-1088. Where should I keep my medicine? This drug is given in a hospital or clinic and will not be stored at home. NOTE: This sheet is a summary. It may not cover all possible information. If you have questions about this medicine, talk to your doctor, pharmacist, or health care provider.  2021 [employer] Standard (2018-06-14 12:20:35)  Leucovorin injection What is this medicine? LEUCOVORIN (loo koe VOR in) is used to prevent or treat the harmful effects of some medicines. This medicine is used to treat anemia caused by a low amount of folic acid in the body. It is also used with 5-fluorouracil (5-FU) to treat colon cancer. This medicine may be used for other purposes; ask your health care provider or pharmacist if you have questions. What should I tell my health care provider before I take this medicine? They need to know if you have any of these conditions:  anemia from low levels of vitamin B-12 in the blood  an unusual or allergic reaction to leucovorin, folic acid, other medicines, foods, dyes, or preservatives  pregnant or trying to get pregnant  breast-feeding How should I use this medicine? This medicine is for injection into a muscle or into a vein. It is given by a health care professional in a hospital or clinic setting. Talk to your pediatrician regarding the use of this medicine in children. Special care may be needed. Overdosage: If you think you have taken too much of this medicine contact a poison control center or emergency room at once. NOTE: This medicine is only for you. Do not share this medicine with  others. What if I miss a dose? This does not apply. What may interact with this medicine?  capecitabine  fluorouracil  phenobarbital  phenytoin  primidone  trimethoprim-sulfamethoxazole This list may not describe all possible interactions. Give your health care provider a list of all the medicines, herbs, non-prescription drugs, or dietary supplements you use. Also tell them if you smoke, drink alcohol, or use illegal drugs. Some items may interact with your medicine. What should I watch for while using this medicine? Your condition will be monitored carefully while you are receiving this medicine. This medicine may increase the side effects of 5-fluorouracil, 5-FU. Tell your doctor or health care professional if you have diarrhea or mouth sores that do not get better or that get worse. What side effects may I notice from receiving this medicine? Side effects that you should report to your doctor or health care professional as soon as possible:  allergic reactions like skin rash, itching or hives, swelling of the face,   lips, or tongue  breathing problems  fever, infection  mouth sores  unusual bleeding or bruising  unusually weak or tired Side effects that usually do not require medical attention (report to your doctor or health care professional if they continue or are bothersome):  constipation or diarrhea  loss of appetite  nausea, vomiting This list may not describe all possible side effects. Call your doctor for medical advice about side effects. You may report side effects to FDA at 1-800-FDA-1088. Where should I keep my medicine? This drug is given in a hospital or clinic and will not be stored at home. NOTE: This sheet is a summary. It may not cover all possible information. If you have questions about this medicine, talk to your doctor, pharmacist, or health care provider.  2021 [employer]/Gold Standard (2007-08-01 16:50:29)  Fluorouracil, 5-FU injection What  is this medicine? FLUOROURACIL, 5-FU (flure oh YOOR a sil) is a chemotherapy drug. It slows the growth of cancer cells. This medicine is used to treat many types of cancer like breast cancer, colon or rectal cancer, pancreatic cancer, and stomach cancer. This medicine may be used for other purposes; ask your health care provider or pharmacist if you have questions. COMMON BRAND NAME(S): Adrucil What should I tell my health care provider before I take this medicine? They need to know if you have any of these conditions:  blood disorders  dihydropyrimidine dehydrogenase (DPD) deficiency  infection (especially a virus infection such as chickenpox, cold sores, or herpes)  kidney disease  liver disease  malnourished, poor nutrition  recent or ongoing radiation therapy  an unusual or allergic reaction to fluorouracil, other chemotherapy, other medicines, foods, dyes, or preservatives  pregnant or trying to get pregnant  breast-feeding How should I use this medicine? This drug is given as an infusion or injection into a vein. It is administered in a hospital or clinic by a specially trained health care professional. Talk to your pediatrician regarding the use of this medicine in children. Special care may be needed. Overdosage: If you think you have taken too much of this medicine contact a poison control center or emergency room at once. NOTE: This medicine is only for you. Do not share this medicine with others. What if I miss a dose? It is important not to miss your dose. Call your doctor or health care professional if you are unable to keep an appointment. What may interact with this medicine? Do not take this medicine with any of the following medications:  live virus vaccines This medicine may also interact with the following medications:  medicines that treat or prevent blood clots like warfarin, enoxaparin, and dalteparin This list may not describe all possible interactions.  Give your health care provider a list of all the medicines, herbs, non-prescription drugs, or dietary supplements you use. Also tell them if you smoke, drink alcohol, or use illegal drugs. Some items may interact with your medicine. What should I watch for while using this medicine? Visit your doctor for checks on your progress. This drug may make you feel generally unwell. This is not uncommon, as chemotherapy can affect healthy cells as well as cancer cells. Report any side effects. Continue your course of treatment even though you feel ill unless your doctor tells you to stop. In some cases, you may be given additional medicines to help with side effects. Follow all directions for their use. Call your doctor or health care professional for advice if you get a fever, chills or   sore throat, or other symptoms of a cold or flu. Do not treat yourself. This drug decreases your body's ability to fight infections. Try to avoid being around people who are sick. This medicine may increase your risk to bruise or bleed. Call your doctor or health care professional if you notice any unusual bleeding. Be careful brushing and flossing your teeth or using a toothpick because you may get an infection or bleed more easily. If you have any dental work done, tell your dentist you are receiving this medicine. Avoid taking products that contain aspirin, acetaminophen, ibuprofen, naproxen, or ketoprofen unless instructed by your doctor. These medicines may hide a fever. Do not become pregnant while taking this medicine. Women should inform their doctor if they wish to become pregnant or think they might be pregnant. There is a potential for serious side effects to an unborn child. Talk to your health care professional or pharmacist for more information. Do not breast-feed an infant while taking this medicine. Men should inform their doctor if they wish to father a child. This medicine may lower sperm counts. Do not treat  diarrhea with over the counter products. Contact your doctor if you have diarrhea that lasts more than 2 days or if it is severe and watery. This medicine can make you more sensitive to the sun. Keep out of the sun. If you cannot avoid being in the sun, wear protective clothing and use sunscreen. Do not use sun lamps or tanning beds/booths. What side effects may I notice from receiving this medicine? Side effects that you should report to your doctor or health care professional as soon as possible:  allergic reactions like skin rash, itching or hives, swelling of the face, lips, or tongue  low blood counts - this medicine may decrease the number of white blood cells, red blood cells and platelets. You may be at increased risk for infections and bleeding.  signs of infection - fever or chills, cough, sore throat, pain or difficulty passing urine  signs of decreased platelets or bleeding - bruising, pinpoint red spots on the skin, black, tarry stools, blood in the urine  signs of decreased red blood cells - unusually weak or tired, fainting spells, lightheadedness  breathing problems  changes in vision  chest pain  mouth sores  nausea and vomiting  pain, swelling, redness at site where injected  pain, tingling, numbness in the hands or feet  redness, swelling, or sores on hands or feet  stomach pain  unusual bleeding Side effects that usually do not require medical attention (report to your doctor or health care professional if they continue or are bothersome):  changes in finger or toe nails  diarrhea  dry or itchy skin  hair loss  headache  loss of appetite  sensitivity of eyes to the light  stomach upset  unusually teary eyes This list may not describe all possible side effects. Call your doctor for medical advice about side effects. You may report side effects to FDA at 1-800-FDA-1088. Where should I keep my medicine? This drug is given in a hospital or clinic  and will not be stored at home. NOTE: This sheet is a summary. It may not cover all possible information. If you have questions about this medicine, talk to your doctor, pharmacist, or health care provider.  2021 [employer] Standard (2018-12-26 15:00:03)  

## 2020-03-06 ENCOUNTER — Telehealth: Payer: Self-pay | Admitting: *Deleted

## 2020-03-07 ENCOUNTER — Inpatient Hospital Stay: Payer: Medicare Other

## 2020-03-07 ENCOUNTER — Other Ambulatory Visit: Payer: Self-pay

## 2020-03-07 VITALS — BP 130/64 | HR 74 | Temp 99.0°F | Resp 18

## 2020-03-07 DIAGNOSIS — Z5111 Encounter for antineoplastic chemotherapy: Secondary | ICD-10-CM | POA: Diagnosis not present

## 2020-03-07 DIAGNOSIS — C20 Malignant neoplasm of rectum: Secondary | ICD-10-CM

## 2020-03-07 MED ORDER — HEPARIN SOD (PORK) LOCK FLUSH 100 UNIT/ML IV SOLN
500.0000 [IU] | Freq: Once | INTRAVENOUS | Status: AC | PRN
Start: 2020-03-07 — End: 2020-03-07
  Administered 2020-03-07: 500 [IU]
  Filled 2020-03-07: qty 5

## 2020-03-07 MED ORDER — SODIUM CHLORIDE 0.9% FLUSH
10.0000 mL | INTRAVENOUS | Status: DC | PRN
  Administered 2020-03-07: 10 mL
  Filled 2020-03-07: qty 10

## 2020-03-07 NOTE — Patient Instructions (Signed)

## 2020-03-19 ENCOUNTER — Inpatient Hospital Stay: Payer: Medicare Other

## 2020-03-19 ENCOUNTER — Other Ambulatory Visit: Payer: Self-pay

## 2020-03-19 ENCOUNTER — Inpatient Hospital Stay: Payer: Medicare Other | Attending: Hematology and Oncology

## 2020-03-19 ENCOUNTER — Inpatient Hospital Stay (HOSPITAL_BASED_OUTPATIENT_CLINIC_OR_DEPARTMENT_OTHER): Payer: Medicare Other | Admitting: Hematology and Oncology

## 2020-03-19 ENCOUNTER — Other Ambulatory Visit: Payer: Self-pay | Admitting: Hematology and Oncology

## 2020-03-19 VITALS — BP 139/78 | HR 77 | Temp 97.9°F | Resp 16 | Ht 63.0 in | Wt 165.4 lb

## 2020-03-19 DIAGNOSIS — K219 Gastro-esophageal reflux disease without esophagitis: Secondary | ICD-10-CM | POA: Diagnosis not present

## 2020-03-19 DIAGNOSIS — C20 Malignant neoplasm of rectum: Secondary | ICD-10-CM

## 2020-03-19 DIAGNOSIS — Z79899 Other long term (current) drug therapy: Secondary | ICD-10-CM | POA: Insufficient documentation

## 2020-03-19 DIAGNOSIS — D500 Iron deficiency anemia secondary to blood loss (chronic): Secondary | ICD-10-CM

## 2020-03-19 DIAGNOSIS — Z95828 Presence of other vascular implants and grafts: Secondary | ICD-10-CM | POA: Diagnosis not present

## 2020-03-19 DIAGNOSIS — Z5111 Encounter for antineoplastic chemotherapy: Secondary | ICD-10-CM | POA: Insufficient documentation

## 2020-03-19 DIAGNOSIS — K921 Melena: Secondary | ICD-10-CM | POA: Diagnosis not present

## 2020-03-19 DIAGNOSIS — R252 Cramp and spasm: Secondary | ICD-10-CM | POA: Diagnosis not present

## 2020-03-19 LAB — CBC WITH DIFFERENTIAL (CANCER CENTER ONLY)
Abs Immature Granulocytes: 0 10*3/uL (ref 0.00–0.07)
Basophils Absolute: 0.1 10*3/uL (ref 0.0–0.1)
Basophils Relative: 2 %
Eosinophils Absolute: 0.1 10*3/uL (ref 0.0–0.5)
Eosinophils Relative: 3 %
HCT: 35.2 % — ABNORMAL LOW (ref 36.0–46.0)
Hemoglobin: 11.6 g/dL — ABNORMAL LOW (ref 12.0–15.0)
Immature Granulocytes: 0 %
Lymphocytes Relative: 36 %
Lymphs Abs: 1.5 10*3/uL (ref 0.7–4.0)
MCH: 29.5 pg (ref 26.0–34.0)
MCHC: 33 g/dL (ref 30.0–36.0)
MCV: 89.6 fL (ref 80.0–100.0)
Monocytes Absolute: 0.5 10*3/uL (ref 0.1–1.0)
Monocytes Relative: 13 %
Neutro Abs: 1.9 10*3/uL (ref 1.7–7.7)
Neutrophils Relative %: 46 %
Platelet Count: 202 10*3/uL (ref 150–400)
RBC: 3.93 MIL/uL (ref 3.87–5.11)
RDW: 13.2 % (ref 11.5–15.5)
WBC Count: 4.1 10*3/uL (ref 4.0–10.5)
nRBC: 0 % (ref 0.0–0.2)

## 2020-03-19 LAB — CMP (CANCER CENTER ONLY)
ALT: 22 U/L (ref 0–44)
AST: 32 U/L (ref 15–41)
Albumin: 3.8 g/dL (ref 3.5–5.0)
Alkaline Phosphatase: 86 U/L (ref 38–126)
Anion gap: 7 (ref 5–15)
BUN: 6 mg/dL — ABNORMAL LOW (ref 8–23)
CO2: 28 mmol/L (ref 22–32)
Calcium: 9 mg/dL (ref 8.9–10.3)
Chloride: 108 mmol/L (ref 98–111)
Creatinine: 0.8 mg/dL (ref 0.44–1.00)
GFR, Estimated: >60 mL/min (ref >60)
Glucose, Bld: 95 mg/dL (ref 70–99)
Potassium: 3.3 mmol/L — ABNORMAL LOW (ref 3.5–5.1)
Sodium: 143 mmol/L (ref 135–145)
Total Bilirubin: 0.6 mg/dL (ref 0.3–1.2)
Total Protein: 7.2 g/dL (ref 6.5–8.1)

## 2020-03-19 LAB — LACTATE DEHYDROGENASE: LDH: 164 U/L (ref 98–192)

## 2020-03-19 LAB — MAGNESIUM: Magnesium: 2.1 mg/dL (ref 1.7–2.4)

## 2020-03-19 MED ORDER — SODIUM CHLORIDE 0.9 % IV SOLN
2400.0000 mg/m2 | INTRAVENOUS | Status: DC
  Administered 2020-03-19: 4300 mg via INTRAVENOUS
  Filled 2020-03-19: qty 86

## 2020-03-19 MED ORDER — FLUOROURACIL CHEMO INJECTION 2.5 GM/50ML
400.0000 mg/m2 | Freq: Once | INTRAVENOUS | Status: AC
  Administered 2020-03-19: 700 mg via INTRAVENOUS
  Filled 2020-03-19: qty 14

## 2020-03-19 MED ORDER — OXALIPLATIN CHEMO INJECTION 100 MG/20ML
84.0000 mg/m2 | Freq: Once | INTRAVENOUS | Status: AC
  Administered 2020-03-19: 150 mg via INTRAVENOUS
  Filled 2020-03-19: qty 20

## 2020-03-19 MED ORDER — POTASSIUM CHLORIDE CRYS ER 20 MEQ PO TBCR: 40.0000 meq | EXTENDED_RELEASE_TABLET | Freq: Every day | ORAL | 0 refills | Status: DC

## 2020-03-19 MED ORDER — DEXTROSE 5 % IV SOLN
Freq: Once | INTRAVENOUS | Status: AC
  Filled 2020-03-19: qty 250

## 2020-03-19 MED ORDER — SODIUM CHLORIDE 0.9 % IV SOLN
10.0000 mg | Freq: Once | INTRAVENOUS | Status: AC
  Administered 2020-03-19: 10 mg via INTRAVENOUS
  Filled 2020-03-19: qty 10

## 2020-03-19 MED ORDER — LEUCOVORIN CALCIUM INJECTION 350 MG
400.0000 mg/m2 | Freq: Once | INTRAVENOUS | Status: AC
  Administered 2020-03-19: 720 mg via INTRAVENOUS
  Filled 2020-03-19: qty 36

## 2020-03-19 MED ORDER — PALONOSETRON HCL INJECTION 0.25 MG/5ML
INTRAVENOUS | Status: AC
  Filled 2020-03-19: qty 5

## 2020-03-19 MED ORDER — SODIUM CHLORIDE 0.9% FLUSH
10.0000 mL | INTRAVENOUS | Status: DC | PRN
  Administered 2020-03-19: 10 mL via INTRAVENOUS
  Filled 2020-03-19: qty 10

## 2020-03-19 MED ORDER — PALONOSETRON HCL INJECTION 0.25 MG/5ML
0.2500 mg | Freq: Once | INTRAVENOUS | Status: AC
  Administered 2020-03-19: 0.25 mg via INTRAVENOUS

## 2020-03-19 NOTE — Progress Notes (Signed)
[place] [phone]) [phone]   [phone]  PROGRESS NOTE  Patient Care Team: Patient, No Pcp Per as PCP - General (General Practice) [name], RN as Oncology Nurse Navigator [name], MD as Consulting Physician (Hematology and Oncology)  Hematological/Oncological History # Rectal Adenocarcainoma, Stage T3N1M0 1) 01/18/2020: colonoscopy due to bright red blood per rectum. A mass was found in the rectum 8 cm from the anal verge. Biopsy confirms moderately differentiated adenocarcinoma.  2) 01/29/2020: CT chest showed no evidence of metastatic disease. MRI showed a T2-T3 malignancy that appears to involve the internal sphincter and 2 mesorectal nodes 3) 02/07/2020: seen at [facility] by Oncologist Dr. Georgiann Cocker 4) 02/11/2020: seen at [facility] by Dr. [name] 5) 02/21/2020: establish care with Dr. [name]  6) 03/05/2020: Start of Neoadjuvant FOLFOX (anticipate 12-16 weeks of chemotherapy) 7) 03/19/2020: Cycle 2 Day 1 of Neoadjuvant FOLFOX   Interval History:  Laneka Mcgrory 65 y.o. female with medical history significant for rectal cancer who presents for a follow up visit. The patient's last visit was on 03/05/2020. In the interim since the last visit Mrs. Duggin has started chemotherapy.   On exam today Mrs. Bunte reports that she tolerated cycle 1 of chemotherapy quite well with very little side effects.  She reports that she had no nausea, and did not require any of her nausea medications.  She notes that she did have somewhat of a headache and some cold sensitivity in her throat.  She notes even room temperature water was too cold within the first few days of receiving her oxaliplatin therapy.  She notes that she has not been having any issues with numbness or tingling in her fingers and toes.  She feels like the sensation has subsequently dissipated.  Otherwise she has still seen some blood in her stool but it is not increased  in severity.  She has been having some trouble with cramps in her legs but her energy level is quite good. she otherwise denies any fevers, chills, sweats, nausea vomiting or diarrhea.  Full 10 point ROS is listed below.   MEDICAL HISTORY:  Past Medical History:  Diagnosis Date  . Acid reflux   . Allergy   . Rectal cancer [facility])     SURGICAL HISTORY: Past Surgical History:  Procedure Laterality Date  . ANTERIOR AND POSTERIOR REPAIR N/A 01/20/2015   Procedure: ANTERIOR (CYSTOCELE) AND POSTERIOR REPAIR (RECTOCELE) WITH PERINEORRHAPHY AND UTEROSACRAL LIGAMENT  SUSPENSION ;  Surgeon: Brien Few, MD;  Location: [place] [facility];  Service: Gynecology;  Laterality: N/A;  . IR IMAGING GUIDED PORT INSERTION  02/26/2020  . ROBOTIC ASSISTED TOTAL HYSTERECTOMY WITH SALPINGECTOMY Bilateral 01/20/2015   Procedure: ROBOTIC ASSISTED TOTAL HYSTERECTOMY WITH BILATERAL SALPINGECTOMY;  Surgeon: Brien Few, MD;  Location: [place] [facility];  Service: Gynecology;  Laterality: Bilateral;  . TUBAL LIGATION      SOCIAL HISTORY: Social History   Socioeconomic History  . Marital status: Widowed    Spouse name: Not on file  . Number of children: Not on file  . Years of education: Not on file  . Highest education level: Not on file  Occupational History  . Not on file  Tobacco Use  . Smoking status: Never Smoker  . Smokeless tobacco: Never Used  Substance and Sexual Activity  . Alcohol use: No    Alcohol/week: 0.0 standard drinks  . Drug use: No  . Sexual activity: Not on file  Other Topics Concern  . Not on file  Social History Narrative  . Not on file   Social Determinants of Health   Financial Resource Strain: Not on file  Food Insecurity: Not on file  Transportation Needs: Not on file  Physical Activity: Not on file  Stress: Not on file  Social Connections: Not on file  Intimate Partner Violence: Not on file    FAMILY HISTORY: Family History  Problem Relation Age of Onset  . Diabetes Mother   .  Heart disease Mother   . Cancer Brother   . Stroke Maternal Grandmother     ALLERGIES:  is allergic to penicillins.  MEDICATIONS:  Current Outpatient Medications  Medication Sig Dispense Refill  . acetaminophen (TYLENOL) 325 MG tablet Take 650 mg by mouth every 6 (six) hours as needed.    [name] ascorbic acid (VITAMIN C) 250 MG tablet Take by mouth.    . Cholecalciferol (VITAMIN D3) 25 MCG (1000 UT) CAPS Take by mouth.    . estradiol (ESTRACE) 0.1 MG/GM vaginal cream Place vaginally.    [name] L-Lysine 500 MG TABS Take by mouth.    . lidocaine-prilocaine (EMLA) cream Apply 1 application topically as needed. 30 g 0  . Multiple Vitamin (MULTIVITAMIN WITH MINERALS) TABS tablet Take 1 tablet by mouth daily.    [name] omeprazole (PRILOSEC OTC) 20 MG tablet Take by mouth.    . ondansetron (ZOFRAN) 8 MG tablet Take 1 tablet (8 mg total) by mouth every 8 (eight) hours as needed for nausea or vomiting. 30 tablet 0  . prochlorperazine (COMPAZINE) 10 MG tablet Take 1 tablet (10 mg total) by mouth every 6 (six) hours as needed for nausea or vomiting. 30 tablet 0  . zinc gluconate 50 MG tablet Take by mouth.     No current facility-administered medications for this visit.    REVIEW OF SYSTEMS:   Constitutional: ( - ) fevers, ( - )  chills , ( - ) night sweats Eyes: ( - ) blurriness of vision, ( - ) double vision, ( - ) watery eyes Ears, nose, mouth, throat, and face: ( - ) mucositis, ( - ) sore throat Respiratory: ( - ) cough, ( - ) dyspnea, ( - ) wheezes Cardiovascular: ( - ) palpitation, ( - ) chest discomfort, ( - ) lower extremity swelling Gastrointestinal:  ( - ) nausea, ( - ) heartburn, ( - ) change in bowel habits Skin: ( - ) abnormal skin rashes Lymphatics: ( - ) new lymphadenopathy, ( - ) easy bruising Neurological: ( - ) numbness, ( - ) tingling, ( - ) new weaknesses Behavioral/Psych: ( - ) mood change, ( - ) new changes  All other systems were reviewed with the patient and are  negative.  PHYSICAL EXAMINATION: ECOG PERFORMANCE STATUS: 1 - Symptomatic but completely ambulatory  Vitals:   03/19/20 1109  BP: 139/78  Pulse: 77  Resp: 16  Temp: 97.9 F (36.6 C)  SpO2: 100%   Filed Weights   03/19/20 1109  Weight: 165 lb 6.4 oz (75 kg)    GENERAL: well appearing middle aged Caucasian female alert, no distress and comfortable SKIN: skin color, texture, turgor are normal, no rashes or significant lesions EYES: conjunctiva are pink and non-injected, sclera clear LUNGS: clear to auscultation and percussion with normal breathing effort HEART: regular rate & rhythm and no murmurs and no lower extremity edema Musculoskeletal: no cyanosis of digits and no clubbing  PSYCH: alert & oriented x 3, fluent speech NEURO: no focal motor/sensory deficits  LABORATORY DATA:  I have reviewed the data as listed CBC Latest Ref Rng & Units 03/19/2020 03/05/2020 02/21/2020  WBC 4.0 - 10.5 K/uL 4.1 7.1 10.2  Hemoglobin 12.0 - 15.0 g/dL 11.6(L) 12.8 13.8  Hematocrit 36.0 - 46.0 % 35.2(L) 39.1 41.8  Platelets 150 - 400 K/uL 202 214 274    CMP Latest Ref Rng & Units 03/05/2020 02/21/2020 01/21/2015  Glucose 70 - 99 mg/dL 110(H) 96 148(H)  BUN 8 - 23 mg/dL 13 14 9   Creatinine 0.44 - 1.00 mg/dL 0.86 0.84 0.88  Sodium 135 - 145 mmol/L 142 140 137  Potassium 3.5 - 5.1 mmol/L 3.6 3.4(L) 3.5  Chloride 98 - 111 mmol/L 109 103 101  CO2 22 - 32 mmol/L 24 26 29   Calcium 8.9 - 10.3 mg/dL 9.2 9.3 8.9  Total Protein 6.5 - 8.1 g/dL 7.6 8.0 -  Total Bilirubin 0.3 - 1.2 mg/dL 0.6 0.6 -  Alkaline Phos 38 - 126 U/L 81 80 -  AST 15 - 41 U/L 24 23 -  ALT 0 - 44 U/L 17 25 -     RADIOGRAPHIC STUDIES: IR IMAGING GUIDED PORT INSERTION  Result Date: 02/26/2020 INDICATION: 65 year old with rectal cancer. Port-A-Cath needed for chemotherapy. EXAM: FLUOROSCOPIC AND ULTRASOUND GUIDED PLACEMENT OF A SUBCUTANEOUS PORT COMPARISON:  None. MEDICATIONS: Cleocin 900 mg; The antibiotic was administered within  an appropriate time interval prior to skin puncture. ANESTHESIA/SEDATION: Versed 3.0 mg IV; Fentanyl 100 mcg IV; Moderate Sedation Time:  29 minutes The patient was continuously monitored during the procedure by the interventional radiology nurse under my direct supervision. FLUOROSCOPY TIME:  18 seconds, 2 mGy COMPLICATIONS: None immediate. PROCEDURE: The procedure, risks, benefits, and alternatives were explained to the patient. Questions regarding the procedure were encouraged and answered. The patient understands and consents to the procedure. Patient was placed supine on the interventional table. Ultrasound confirmed a patent right internal jugular vein. Ultrasound image was saved for documentation. The right chest and neck were cleaned with a skin antiseptic and a sterile drape was placed. Maximal barrier sterile technique was utilized including caps, mask, sterile gowns, sterile gloves, sterile drape, hand hygiene and skin antiseptic. The right neck was anesthetized with 1% lidocaine. Small incision was made in the right neck with a blade. Micropuncture set was placed in the right internal jugular vein with ultrasound guidance. The micropuncture wire was used for measurement purposes. The right chest was anesthetized with 1% lidocaine with epinephrine. #15 blade was used to make an incision and a subcutaneous port pocket was formed. Zelienople was assembled. Subcutaneous tunnel was formed with a stiff tunneling device. The port catheter was brought through the subcutaneous tunnel. The port was placed in the subcutaneous pocket and sutured in place using Ethilon suture. The micropuncture set was exchanged for a peel-away sheath. The catheter was placed through the peel-away sheath and the tip was positioned at the superior cavoatrial junction. Catheter placement was confirmed with fluoroscopy. The port was accessed and flushed with heparinized saline. The port pocket was closed using two layers of  absorbable sutures and Dermabond. The vein skin site was closed using a single layer of absorbable suture and Dermabond. Sterile dressings were applied. Patient tolerated the procedure well without an immediate complication. Ultrasound and fluoroscopic images were taken and saved for this procedure. IMPRESSION: Placement of a subcutaneous port device. Catheter tip at the superior cavoatrial junction. Electronically Signed   By: Markus Daft M.D.   On: 02/26/2020 16:57    ASSESSMENT &  PLAN Azarah Dacy 65 y.o. female with medical history significant for rectal cancer who presents for a follow up visit.  The current plan to treat the patient with FOLFOX chemotherapy every 2 weeks x8 cycles, for total of 16 weeks for the treatment.  Then we will start chemoradiation with capecitabine 825 mg per metered squared twice daily on days of radiation, typically for about 28 fractions.  Mrs. Muramoto established with Dr. Lisbeth Renshaw who will be performing the radiation component of her treatment.  Once this is complete we will reevaluate with repeat imaging in proceed to surgery if feasible.  The patient and her daughter-in-law voiced their understanding of this plan moving forward.  Previously we discussed the FOLFOX chemotherapy treatment and the expected side effects including nausea, vomiting, diarrhea, neutropenia, nerve pain, and hand-foot syndrome.  Discussed that these are the most common and dangerous side effects, but is not limited to these alone.  The patient was agreeable to proceeding with FOLFOX chemotherapy at this time.  We will plan for chemotherapy education as well as port placement as soon as possible.  # Rectal Adenocarcainoma, Stage T3N1M0 -- Findings consistent with a stage III T3 N1 M0 rectal adenocarcinoma.  Treatment of choice would be total neoadjuvant chemotherapy/ chemoradiation followed by surgical resection. -- Started Cycle 1 Day 1 of FOLFOX chemotherapy on 03/05/2020.  Today is Cycle 2  Day 1.  -- initial pretreatment CEA found to be within normal limits, not likely to be of value moving forward.  --monitor CBC, CMP, and magnesium with each cycle. --RTC in 2 weeks for Cycle 2 of FOLFOX.   #Supportive Care --chemotherapy education completed --port in place --zofran 8mg  q8H PRN and compazine 10mg  PO q6H for nausea -- EMLA cream for port -- no pain medication required at this time.   No orders of the defined types were placed in this encounter.  All questions were answered. The patient knows to call the clinic with any problems, questions or concerns.  A total of more than 30 minutes were spent on this encounter and over half of that time was spent on counseling and coordination of care as outlined above.   Ledell Peoples, MD Department of Hematology/Oncology [place] [facility] Phone: [phone] Pager: [phone] Email: [email or web address]  03/19/2020 11:29 AM

## 2020-03-19 NOTE — Patient Instructions (Signed)

## 2020-03-19 NOTE — Patient Instructions (Signed)
[facility] Discharge Instructions for Patients Receiving Chemotherapy  Today you received the following chemotherapy agents: Oxaliplatin, Leucovorin, 5FU  To help prevent nausea and vomiting after your treatment, we encourage you to take your nausea medication as directed.   If you develop nausea and vomiting that is not controlled by your nausea medication, call the clinic.   BELOW ARE SYMPTOMS THAT SHOULD BE REPORTED IMMEDIATELY:  *FEVER GREATER THAN 100.5 F  *CHILLS WITH OR WITHOUT FEVER  NAUSEA AND VOMITING THAT IS NOT CONTROLLED WITH YOUR NAUSEA MEDICATION  *UNUSUAL SHORTNESS OF BREATH  *UNUSUAL BRUISING OR BLEEDING  TENDERNESS IN MOUTH AND THROAT WITH OR WITHOUT PRESENCE OF ULCERS  *URINARY PROBLEMS  *BOWEL PROBLEMS  UNUSUAL RASH Items with * indicate a potential emergency and should be followed up as soon as possible.  Feel free to call the clinic should you have any questions or concerns. The clinic phone number is [phone].  Please show the [facility] at check-in to the Emergency Department and triage nurse.  Oxaliplatin Injection What is this medicine? OXALIPLATIN (ox AL i PLA tin) is a chemotherapy drug. It targets fast dividing cells, like cancer cells, and causes these cells to die. This medicine is used to treat cancers of the colon and rectum, and many other cancers. This medicine may be used for other purposes; ask your health care provider or pharmacist if you have questions. COMMON BRAND NAME(S): Eloxatin What should I tell my health care provider before I take this medicine? They need to know if you have any of these conditions:  heart disease  history of irregular heartbeat  liver disease  low blood counts, like white cells, platelets, or red blood cells  lung or breathing disease, like asthma  take medicines that treat or prevent blood clots  tingling of the fingers or toes, or other nerve disorder  an unusual or  allergic reaction to oxaliplatin, other chemotherapy, other medicines, foods, dyes, or preservatives  pregnant or trying to get pregnant  breast-feeding How should I use this medicine? This drug is given as an infusion into a vein. It is administered in a hospital or clinic by a specially trained health care professional. Talk to your pediatrician regarding the use of this medicine in children. Special care may be needed. Overdosage: If you think you have taken too much of this medicine contact a poison control center or emergency room at once. NOTE: This medicine is only for you. Do not share this medicine with others. What if I miss a dose? It is important not to miss a dose. Call your doctor or health care professional if you are unable to keep an appointment. What may interact with this medicine? Do not take this medicine with any of the following medications:  cisapride  dronedarone  pimozide  thioridazine This medicine may also interact with the following medications:  aspirin and aspirin-like medicines  certain medicines that treat or prevent blood clots like warfarin, apixaban, dabigatran, and rivaroxaban  cisplatin  cyclosporine  diuretics  medicines for infection like acyclovir, adefovir, amphotericin B, bacitracin, cidofovir, foscarnet, ganciclovir, gentamicin, pentamidine, vancomycin  NSAIDs, medicines for pain and inflammation, like ibuprofen or naproxen  other medicines that prolong the QT interval (an abnormal heart rhythm)  pamidronate  zoledronic acid This list may not describe all possible interactions. Give your health care provider a list of all the medicines, herbs, non-prescription drugs, or dietary supplements you use. Also tell them if you smoke, drink alcohol,  or use illegal drugs. Some items may interact with your medicine. What should I watch for while using this medicine? Your condition will be monitored carefully while you are receiving this  medicine. You may need blood work done while you are taking this medicine. This medicine may make you feel generally unwell. This is not uncommon as chemotherapy can affect healthy cells as well as cancer cells. Report any side effects. Continue your course of treatment even though you feel ill unless your healthcare professional tells you to stop. This medicine can make you more sensitive to cold. Do not drink cold drinks or use ice. Cover exposed skin before coming in contact with cold temperatures or cold objects. When out in cold weather wear warm clothing and cover your mouth and nose to warm the air that goes into your lungs. Tell your doctor if you get sensitive to the cold. Do not become pregnant while taking this medicine or for 9 months after stopping it. Women should inform their health care professional if they wish to become pregnant or think they might be pregnant. Men should not father a child while taking this medicine and for 6 months after stopping it. There is potential for serious side effects to an unborn child. Talk to your health care professional for more information. Do not breast-feed a child while taking this medicine or for 3 months after stopping it. This medicine has caused ovarian failure in some women. This medicine may make it more difficult to get pregnant. Talk to your health care professional if you are concerned about your fertility. This medicine has caused decreased sperm counts in some men. This may make it more difficult to father a child. Talk to your health care professional if you are concerned about your fertility. This medicine may increase your risk of getting an infection. Call your health care professional for advice if you get a fever, chills, or sore throat, or other symptoms of a cold or flu. Do not treat yourself. Try to avoid being around people who are sick. Avoid taking medicines that contain aspirin, acetaminophen, ibuprofen, naproxen, or ketoprofen  unless instructed by your health care professional. These medicines may hide a fever. Be careful brushing or flossing your teeth or using a toothpick because you may get an infection or bleed more easily. If you have any dental work done, tell your dentist you are receiving this medicine. What side effects may I notice from receiving this medicine? Side effects that you should report to your doctor or health care professional as soon as possible:  allergic reactions like skin rash, itching or hives, swelling of the face, lips, or tongue  breathing problems  cough  low blood counts - this medicine may decrease the number of white blood cells, red blood cells, and platelets. You may be at increased risk for infections and bleeding  nausea, vomiting  pain, redness, or irritation at site where injected  pain, tingling, numbness in the hands or feet  signs and symptoms of bleeding such as bloody or black, tarry stools; red or dark brown urine; spitting up blood or brown material that looks like coffee grounds; red spots on the skin; unusual bruising or bleeding from the eyes, gums, or nose  signs and symptoms of a dangerous change in heartbeat or heart rhythm like chest pain; dizziness; fast, irregular heartbeat; palpitations; feeling faint or lightheaded; falls  signs and symptoms of infection like fever; chills; cough; sore throat; pain or trouble passing urine    signs and symptoms of liver injury like dark yellow or brown urine; general ill feeling or flu-like symptoms; light-colored stools; loss of appetite; nausea; right upper belly pain; unusually weak or tired; yellowing of the eyes or skin  signs and symptoms of low red blood cells or anemia such as unusually weak or tired; feeling faint or lightheaded; falls  signs and symptoms of muscle injury like dark urine; trouble passing urine or change in the amount of urine; unusually weak or tired; muscle pain; back pain Side effects that  usually do not require medical attention (report to your doctor or health care professional if they continue or are bothersome):  changes in taste  diarrhea  gas  hair loss  loss of appetite  mouth sores This list may not describe all possible side effects. Call your doctor for medical advice about side effects. You may report side effects to FDA at 1-800-FDA-1088. Where should I keep my medicine? This drug is given in a hospital or clinic and will not be stored at home. NOTE: This sheet is a summary. It may not cover all possible information. If you have questions about this medicine, talk to your doctor, pharmacist, or health care provider.  2021 [employer] Standard (2018-06-14 12:20:35)  Leucovorin injection What is this medicine? LEUCOVORIN (loo koe VOR in) is used to prevent or treat the harmful effects of some medicines. This medicine is used to treat anemia caused by a low amount of folic acid in the body. It is also used with 5-fluorouracil (5-FU) to treat colon cancer. This medicine may be used for other purposes; ask your health care provider or pharmacist if you have questions. What should I tell my health care provider before I take this medicine? They need to know if you have any of these conditions:  anemia from low levels of vitamin B-12 in the blood  an unusual or allergic reaction to leucovorin, folic acid, other medicines, foods, dyes, or preservatives  pregnant or trying to get pregnant  breast-feeding How should I use this medicine? This medicine is for injection into a muscle or into a vein. It is given by a health care professional in a hospital or clinic setting. Talk to your pediatrician regarding the use of this medicine in children. Special care may be needed. Overdosage: If you think you have taken too much of this medicine contact a poison control center or emergency room at once. NOTE: This medicine is only for you. Do not share this medicine with  others. What if I miss a dose? This does not apply. What may interact with this medicine?  capecitabine  fluorouracil  phenobarbital  phenytoin  primidone  trimethoprim-sulfamethoxazole This list may not describe all possible interactions. Give your health care provider a list of all the medicines, herbs, non-prescription drugs, or dietary supplements you use. Also tell them if you smoke, drink alcohol, or use illegal drugs. Some items may interact with your medicine. What should I watch for while using this medicine? Your condition will be monitored carefully while you are receiving this medicine. This medicine may increase the side effects of 5-fluorouracil, 5-FU. Tell your doctor or health care professional if you have diarrhea or mouth sores that do not get better or that get worse. What side effects may I notice from receiving this medicine? Side effects that you should report to your doctor or health care professional as soon as possible:  allergic reactions like skin rash, itching or hives, swelling of the face,  lips, or tongue  breathing problems  fever, infection  mouth sores  unusual bleeding or bruising  unusually weak or tired Side effects that usually do not require medical attention (report to your doctor or health care professional if they continue or are bothersome):  constipation or diarrhea  loss of appetite  nausea, vomiting This list may not describe all possible side effects. Call your doctor for medical advice about side effects. You may report side effects to FDA at 1-800-FDA-1088. Where should I keep my medicine? This drug is given in a hospital or clinic and will not be stored at home. NOTE: This sheet is a summary. It may not cover all possible information. If you have questions about this medicine, talk to your doctor, pharmacist, or health care provider.  2021 [employer]/Gold Standard (2007-08-01 16:50:29)  Fluorouracil, 5-FU injection What  is this medicine? FLUOROURACIL, 5-FU (flure oh YOOR a sil) is a chemotherapy drug. It slows the growth of cancer cells. This medicine is used to treat many types of cancer like breast cancer, colon or rectal cancer, pancreatic cancer, and stomach cancer. This medicine may be used for other purposes; ask your health care provider or pharmacist if you have questions. COMMON BRAND NAME(S): Adrucil What should I tell my health care provider before I take this medicine? They need to know if you have any of these conditions:  blood disorders  dihydropyrimidine dehydrogenase (DPD) deficiency  infection (especially a virus infection such as chickenpox, cold sores, or herpes)  kidney disease  liver disease  malnourished, poor nutrition  recent or ongoing radiation therapy  an unusual or allergic reaction to fluorouracil, other chemotherapy, other medicines, foods, dyes, or preservatives  pregnant or trying to get pregnant  breast-feeding How should I use this medicine? This drug is given as an infusion or injection into a vein. It is administered in a hospital or clinic by a specially trained health care professional. Talk to your pediatrician regarding the use of this medicine in children. Special care may be needed. Overdosage: If you think you have taken too much of this medicine contact a poison control center or emergency room at once. NOTE: This medicine is only for you. Do not share this medicine with others. What if I miss a dose? It is important not to miss your dose. Call your doctor or health care professional if you are unable to keep an appointment. What may interact with this medicine? Do not take this medicine with any of the following medications:  live virus vaccines This medicine may also interact with the following medications:  medicines that treat or prevent blood clots like warfarin, enoxaparin, and dalteparin This list may not describe all possible interactions.  Give your health care provider a list of all the medicines, herbs, non-prescription drugs, or dietary supplements you use. Also tell them if you smoke, drink alcohol, or use illegal drugs. Some items may interact with your medicine. What should I watch for while using this medicine? Visit your doctor for checks on your progress. This drug may make you feel generally unwell. This is not uncommon, as chemotherapy can affect healthy cells as well as cancer cells. Report any side effects. Continue your course of treatment even though you feel ill unless your doctor tells you to stop. In some cases, you may be given additional medicines to help with side effects. Follow all directions for their use. Call your doctor or health care professional for advice if you get a fever, chills or  sore throat, or other symptoms of a cold or flu. Do not treat yourself. This drug decreases your body's ability to fight infections. Try to avoid being around people who are sick. This medicine may increase your risk to bruise or bleed. Call your doctor or health care professional if you notice any unusual bleeding. Be careful brushing and flossing your teeth or using a toothpick because you may get an infection or bleed more easily. If you have any dental work done, tell your dentist you are receiving this medicine. Avoid taking products that contain aspirin, acetaminophen, ibuprofen, naproxen, or ketoprofen unless instructed by your doctor. These medicines may hide a fever. Do not become pregnant while taking this medicine. Women should inform their doctor if they wish to become pregnant or think they might be pregnant. There is a potential for serious side effects to an unborn child. Talk to your health care professional or pharmacist for more information. Do not breast-feed an infant while taking this medicine. Men should inform their doctor if they wish to father a child. This medicine may lower sperm counts. Do not treat  diarrhea with over the counter products. Contact your doctor if you have diarrhea that lasts more than 2 days or if it is severe and watery. This medicine can make you more sensitive to the sun. Keep out of the sun. If you cannot avoid being in the sun, wear protective clothing and use sunscreen. Do not use sun lamps or tanning beds/booths. What side effects may I notice from receiving this medicine? Side effects that you should report to your doctor or health care professional as soon as possible:  allergic reactions like skin rash, itching or hives, swelling of the face, lips, or tongue  low blood counts - this medicine may decrease the number of white blood cells, red blood cells and platelets. You may be at increased risk for infections and bleeding.  signs of infection - fever or chills, cough, sore throat, pain or difficulty passing urine  signs of decreased platelets or bleeding - bruising, pinpoint red spots on the skin, black, tarry stools, blood in the urine  signs of decreased red blood cells - unusually weak or tired, fainting spells, lightheadedness  breathing problems  changes in vision  chest pain  mouth sores  nausea and vomiting  pain, swelling, redness at site where injected  pain, tingling, numbness in the hands or feet  redness, swelling, or sores on hands or feet  stomach pain  unusual bleeding Side effects that usually do not require medical attention (report to your doctor or health care professional if they continue or are bothersome):  changes in finger or toe nails  diarrhea  dry or itchy skin  hair loss  headache  loss of appetite  sensitivity of eyes to the light  stomach upset  unusually teary eyes This list may not describe all possible side effects. Call your doctor for medical advice about side effects. You may report side effects to FDA at 1-800-FDA-1088. Where should I keep my medicine? This drug is given in a hospital or clinic  and will not be stored at home. NOTE: This sheet is a summary. It may not cover all possible information. If you have questions about this medicine, talk to your doctor, pharmacist, or health care provider.  2021 [employer]/Gold Standard (2018-12-26 15:00:03)

## 2020-03-21 ENCOUNTER — Other Ambulatory Visit: Payer: Self-pay

## 2020-03-21 ENCOUNTER — Inpatient Hospital Stay: Payer: Medicare Other

## 2020-03-21 VITALS — BP 139/79 | HR 86 | Temp 98.5°F | Resp 18

## 2020-03-21 DIAGNOSIS — Z5111 Encounter for antineoplastic chemotherapy: Secondary | ICD-10-CM | POA: Diagnosis not present

## 2020-03-21 DIAGNOSIS — C20 Malignant neoplasm of rectum: Secondary | ICD-10-CM

## 2020-03-21 MED ORDER — SODIUM CHLORIDE 0.9% FLUSH
10.0000 mL | INTRAVENOUS | Status: DC | PRN
  Administered 2020-03-21: 10 mL
  Filled 2020-03-21: qty 10

## 2020-03-21 MED ORDER — HEPARIN SOD (PORK) LOCK FLUSH 100 UNIT/ML IV SOLN
500.0000 [IU] | Freq: Once | INTRAVENOUS | Status: AC | PRN
  Administered 2020-03-21: 500 [IU]
  Filled 2020-03-21: qty 5

## 2020-03-21 NOTE — Patient Instructions (Signed)

## 2020-03-22 ENCOUNTER — Encounter: Payer: Self-pay | Admitting: Hematology and Oncology

## 2020-04-01 ENCOUNTER — Telehealth: Payer: Self-pay

## 2020-04-01 NOTE — Telephone Encounter (Signed)
Pt left a message stating she thinks she "might be having a kidney issue".  I have called the pt back to obtain further details. She states when she bent over this morning at about 10am, she felt a pain in her flank area. Pain was/is 4-5/10 and describes it as an ache when she bends or twists. Pt states she has no other sx. She denies pain or discomfort with urination, fowl  odor, fever, nausea/vomiting, frequency and dizziness.  Pt has labs and OV with Dr. [name] early tomorrow morning. I advised for her to take it easy today and not do anything strenuous. Also, make sure she does not run a fever 100.5 or above and if her sx worsen/change or she begins to run a fever, please call us back, otherwise we will see her in the morning for labs and the OV before her tx. Pt agreed with this plan and expressed understanding.

## 2020-04-02 ENCOUNTER — Inpatient Hospital Stay (HOSPITAL_BASED_OUTPATIENT_CLINIC_OR_DEPARTMENT_OTHER): Payer: Medicare Other | Admitting: Hematology and Oncology

## 2020-04-02 ENCOUNTER — Inpatient Hospital Stay: Payer: Medicare Other

## 2020-04-02 ENCOUNTER — Encounter: Payer: Self-pay | Admitting: Hematology and Oncology

## 2020-04-02 ENCOUNTER — Other Ambulatory Visit: Payer: Self-pay | Admitting: Hematology and Oncology

## 2020-04-02 ENCOUNTER — Other Ambulatory Visit: Payer: Self-pay

## 2020-04-02 VITALS — BP 150/77 | HR 88 | Temp 97.4°F | Resp 16 | Ht 63.0 in | Wt 165.2 lb

## 2020-04-02 DIAGNOSIS — C20 Malignant neoplasm of rectum: Secondary | ICD-10-CM | POA: Diagnosis not present

## 2020-04-02 DIAGNOSIS — Z95828 Presence of other vascular implants and grafts: Secondary | ICD-10-CM

## 2020-04-02 DIAGNOSIS — Z5111 Encounter for antineoplastic chemotherapy: Secondary | ICD-10-CM | POA: Diagnosis not present

## 2020-04-02 DIAGNOSIS — G4440 Drug-induced headache, not elsewhere classified, not intractable: Secondary | ICD-10-CM

## 2020-04-02 LAB — CMP (CANCER CENTER ONLY)
ALT: 37 U/L (ref 0–44)
AST: 39 U/L (ref 15–41)
Albumin: 3.8 g/dL (ref 3.5–5.0)
Alkaline Phosphatase: 88 U/L (ref 38–126)
Anion gap: 8 (ref 5–15)
BUN: 8 mg/dL (ref 8–23)
CO2: 25 mmol/L (ref 22–32)
Calcium: 9.2 mg/dL (ref 8.9–10.3)
Chloride: 106 mmol/L (ref 98–111)
Creatinine: 0.92 mg/dL (ref 0.44–1.00)
GFR, Estimated: >60 mL/min (ref >60)
Glucose, Bld: 108 mg/dL — ABNORMAL HIGH (ref 70–99)
Potassium: 3.6 mmol/L (ref 3.5–5.1)
Sodium: 139 mmol/L (ref 135–145)
Total Bilirubin: 0.6 mg/dL (ref 0.3–1.2)
Total Protein: 7.4 g/dL (ref 6.5–8.1)

## 2020-04-02 LAB — LACTATE DEHYDROGENASE: LDH: 160 U/L (ref 98–192)

## 2020-04-02 LAB — MAGNESIUM: Magnesium: 2.1 mg/dL (ref 1.7–2.4)

## 2020-04-02 LAB — CBC WITH DIFFERENTIAL (CANCER CENTER ONLY)
Abs Immature Granulocytes: 0.01 10*3/uL (ref 0.00–0.07)
Basophils Absolute: 0.1 10*3/uL (ref 0.0–0.1)
Basophils Relative: 3 %
Eosinophils Absolute: 0.1 10*3/uL (ref 0.0–0.5)
Eosinophils Relative: 3 %
HCT: 34.8 % — ABNORMAL LOW (ref 36.0–46.0)
Hemoglobin: 11.6 g/dL — ABNORMAL LOW (ref 12.0–15.0)
Immature Granulocytes: 0 %
Lymphocytes Relative: 40 %
Lymphs Abs: 1.5 10*3/uL (ref 0.7–4.0)
MCH: 29.8 pg (ref 26.0–34.0)
MCHC: 33.3 g/dL (ref 30.0–36.0)
MCV: 89.5 fL (ref 80.0–100.0)
Monocytes Absolute: 0.7 10*3/uL (ref 0.1–1.0)
Monocytes Relative: 18 %
Neutro Abs: 1.3 10*3/uL — ABNORMAL LOW (ref 1.7–7.7)
Neutrophils Relative %: 36 %
Platelet Count: 140 10*3/uL — ABNORMAL LOW (ref 150–400)
RBC: 3.89 MIL/uL (ref 3.87–5.11)
RDW: 13.6 % (ref 11.5–15.5)
WBC Count: 3.7 10*3/uL — ABNORMAL LOW (ref 4.0–10.5)
nRBC: 0 % (ref 0.0–0.2)

## 2020-04-02 MED ORDER — ACETAMINOPHEN 500 MG PO TABS
1000.0000 mg | ORAL_TABLET | Freq: Once | ORAL | Status: AC
  Administered 2020-04-02: 1000 mg via ORAL

## 2020-04-02 MED ORDER — FLUOROURACIL CHEMO INJECTION 2.5 GM/50ML
400.0000 mg/m2 | Freq: Once | INTRAVENOUS | Status: AC
Start: 2020-04-02 — End: 2020-04-02
  Administered 2020-04-02: 700 mg via INTRAVENOUS
  Filled 2020-04-02: qty 14

## 2020-04-02 MED ORDER — PALONOSETRON HCL INJECTION 0.25 MG/5ML
INTRAVENOUS | Status: AC
  Filled 2020-04-02: qty 5

## 2020-04-02 MED ORDER — SODIUM CHLORIDE 0.9 % IV SOLN
2400.0000 mg/m2 | INTRAVENOUS | Status: DC
  Administered 2020-04-02: 4300 mg via INTRAVENOUS
  Filled 2020-04-02: qty 86

## 2020-04-02 MED ORDER — DEXTROSE 5 % IV SOLN
Freq: Once | INTRAVENOUS | Status: AC
  Filled 2020-04-02: qty 250

## 2020-04-02 MED ORDER — LEUCOVORIN CALCIUM INJECTION 350 MG
400.0000 mg/m2 | Freq: Once | INTRAVENOUS | Status: AC
  Administered 2020-04-02: 720 mg via INTRAVENOUS
  Filled 2020-04-02: qty 36

## 2020-04-02 MED ORDER — PALONOSETRON HCL INJECTION 0.25 MG/5ML
0.2500 mg | Freq: Once | INTRAVENOUS | Status: AC
  Administered 2020-04-02: 0.25 mg via INTRAVENOUS

## 2020-04-02 MED ORDER — SODIUM CHLORIDE 0.9 % IV SOLN
10.0000 mg | Freq: Once | INTRAVENOUS | Status: AC
  Administered 2020-04-02: 10 mg via INTRAVENOUS
  Filled 2020-04-02: qty 10

## 2020-04-02 MED ORDER — SODIUM CHLORIDE 0.9% FLUSH
10.0000 mL | Freq: Once | INTRAVENOUS | Status: AC
  Administered 2020-04-02: 10 mL
  Filled 2020-04-02: qty 10

## 2020-04-02 MED ORDER — DEXTROSE 5 % IV SOLN
84.0000 mg/m2 | Freq: Once | INTRAVENOUS | Status: AC
  Administered 2020-04-02: 150 mg via INTRAVENOUS
  Filled 2020-04-02: qty 10

## 2020-04-02 MED ORDER — ACETAMINOPHEN 500 MG PO TABS
ORAL_TABLET | ORAL | Status: AC
  Filled 2020-04-02: qty 2

## 2020-04-02 NOTE — Progress Notes (Signed)
OK to treat with ANC of 1.3 per Dr. Dorsey 

## 2020-04-02 NOTE — Progress Notes (Signed)
[name] [phone]   [phone]  PROGRESS NOTE  Patient Care Team: Patient, No Pcp Per as PCP - General (General Practice) [name], RN as Oncology Nurse Navigator [name], MD as Consulting Physician (Hematology and Oncology)  Hematological/Oncological History # Rectal Adenocarcainoma, Stage T3N1M0 1) 01/18/2020: colonoscopy due to bright red blood per rectum. A mass was found in the rectum 8 cm from the anal verge. Biopsy confirms moderately differentiated adenocarcinoma.  2) 01/29/2020: CT chest showed no evidence of metastatic disease. MRI showed a T2-T3 malignancy that appears to involve the internal sphincter and 2 mesorectal nodes 3) 02/07/2020: seen at [facility] by Oncologist Dr. Georgiann Cocker 4) 02/11/2020: seen at [facility] by Dr. [name] 5) 02/21/2020: establish care with Dr. [name]  6) 03/05/2020: Start of Neoadjuvant FOLFOX (anticipate 12-16 weeks of chemotherapy) 7) 03/19/2020: Cycle 2 Day 1 of Neoadjuvant FOLFOX  8) 04/02/2020: Cycle 3 Day 1 of Neoadjuvant FOLFOX   Interval History:  Cheyenne Scott 65 y.o. female with medical history significant for rectal cancer who presents for a follow up visit. The patient's last visit was on 03/19/2020. In the interim since the last visit Cheyenne Scott has completed Cycle 2 of chemotherapy.   On exam today [name] reports that tolerated chemotherapy well during her last cycle.  She reports that she is not having any bowel or urinary issues.  She reports that she does have a pain in her back for which she called yesterday.  She notes that it happened when she bent down and was a pain that wraps around from her right side around to the front.  She does that she is also prone to sneezing fits when she gets home and she has had occasional late nosebleeds.  She has been taking the potassium pills as prescribed.  Fortunately she has not been having any diarrhea or vomiting,  though she has been having some light nausea particularly around Saturdays after receiving chemotherapy.  She is only required 2 of the antinausea pills.  She does have occasional headaches and sinus pressure, but otherwise has not had any major side effect from her chemotherapy.  She is getting better cold avoidance and has not had any major issues with this.  She is wearing gloves when removing something from the refrigerator.. She denies any fevers, chills, sweats, vomiting or diarrhea.  Full 10 point ROS is listed below.   MEDICAL HISTORY:  Past Medical History:  Diagnosis Date  . Acid reflux   . Allergy   . Rectal cancer [facility])     SURGICAL HISTORY: Past Surgical History:  Procedure Laterality Date  . ANTERIOR AND POSTERIOR REPAIR N/A 01/20/2015   Procedure: ANTERIOR (CYSTOCELE) AND POSTERIOR REPAIR (RECTOCELE) WITH PERINEORRHAPHY AND UTEROSACRAL LIGAMENT  SUSPENSION ;  Surgeon: Brien Few, MD;  Location: [facility];  Service: Gynecology;  Laterality: N/A;  . IR IMAGING GUIDED PORT INSERTION  02/26/2020  . ROBOTIC ASSISTED TOTAL HYSTERECTOMY WITH SALPINGECTOMY Bilateral 01/20/2015   Procedure: ROBOTIC ASSISTED TOTAL HYSTERECTOMY WITH BILATERAL SALPINGECTOMY;  Surgeon: Brien Few, MD;  Location: [facility];  Service: Gynecology;  Laterality: Bilateral;  . TUBAL LIGATION      SOCIAL HISTORY: Social History   Socioeconomic History  . Marital status: Widowed    Spouse name: Not on file  . Number of children: Not on file  . Years of education: Not on file  . Highest education level: Not on file  Occupational History  . Not on  file  Tobacco Use  . Smoking status: Never Smoker  . Smokeless tobacco: Never Used  Substance and Sexual Activity  . Alcohol use: No    Alcohol/week: 0.0 standard drinks  . Drug use: No  . Sexual activity: Not on file  Other Topics Concern  . Not on file  Social History Narrative  . Not on file   Social Determinants of Health   Financial Resource  Strain: Not on file  Food Insecurity: Not on file  Transportation Needs: Not on file  Physical Activity: Not on file  Stress: Not on file  Social Connections: Not on file  Intimate Partner Violence: Not on file    FAMILY HISTORY: Family History  Problem Relation Age of Onset  . Diabetes Mother   . Heart disease Mother   . Cancer Brother   . Stroke Maternal Grandmother     ALLERGIES:  is allergic to penicillins.  MEDICATIONS:  Current Outpatient Medications  Medication Sig Dispense Refill  . acetaminophen (TYLENOL) 325 MG tablet Take 650 mg by mouth every 6 (six) hours as needed.    [name] ascorbic acid (VITAMIN C) 250 MG tablet Take by mouth.    . Cholecalciferol (VITAMIN D3) 25 MCG (1000 UT) CAPS Take by mouth.    . estradiol (ESTRACE) 0.1 MG/GM vaginal cream Place vaginally.    [name] L-Lysine 500 MG TABS Take by mouth.    . lidocaine-prilocaine (EMLA) cream Apply 1 application topically as needed. 30 g 0  . Multiple Vitamin (MULTIVITAMIN WITH MINERALS) TABS tablet Take 1 tablet by mouth daily.    [name] omeprazole (PRILOSEC OTC) 20 MG tablet Take by mouth.    . ondansetron (ZOFRAN) 8 MG tablet Take 1 tablet (8 mg total) by mouth every 8 (eight) hours as needed for nausea or vomiting. 30 tablet 0  . potassium chloride SA (KLOR-CON) 20 MEQ tablet Take 2 tablets (40 mEq total) by mouth daily. 28 tablet 0  . prochlorperazine (COMPAZINE) 10 MG tablet Take 1 tablet (10 mg total) by mouth every 6 (six) hours as needed for nausea or vomiting. 30 tablet 0  . zinc gluconate 50 MG tablet Take by mouth.     No current facility-administered medications for this visit.    REVIEW OF SYSTEMS:   Constitutional: ( - ) fevers, ( - )  chills , ( - ) night sweats Eyes: ( - ) blurriness of vision, ( - ) double vision, ( - ) watery eyes Ears, nose, mouth, throat, and face: ( - ) mucositis, ( - ) sore throat Respiratory: ( - ) cough, ( - ) dyspnea, ( - ) wheezes Cardiovascular: ( - ) palpitation, ( - ) chest  discomfort, ( - ) lower extremity swelling Gastrointestinal:  ( - ) nausea, ( - ) heartburn, ( - ) change in bowel habits Skin: ( - ) abnormal skin rashes Lymphatics: ( - ) new lymphadenopathy, ( - ) easy bruising Neurological: ( - ) numbness, ( - ) tingling, ( - ) new weaknesses Behavioral/Psych: ( - ) mood change, ( - ) new changes  All other systems were reviewed with the patient and are negative.  PHYSICAL EXAMINATION: ECOG PERFORMANCE STATUS: 1 - Symptomatic but completely ambulatory  Vitals:   04/02/20 0819  BP: (!) 150/77  Pulse: 88  Resp: 16  Temp: (!) 97.4 F (36.3 C)  SpO2: 100%   Filed Weights   04/02/20 0819  Weight: 165 lb 3.2 oz (74.9 kg)    GENERAL: well  appearing middle aged Caucasian female alert, no distress and comfortable SKIN: skin color, texture, turgor are normal, no rashes or significant lesions EYES: conjunctiva are pink and non-injected, sclera clear LUNGS: clear to auscultation and percussion with normal breathing effort HEART: regular rate & rhythm and no murmurs and no lower extremity edema Musculoskeletal: no cyanosis of digits and no clubbing  PSYCH: alert & oriented x 3, fluent speech NEURO: no focal motor/sensory deficits  LABORATORY DATA:  I have reviewed the data as listed CBC Latest Ref Rng & Units 04/02/2020 03/19/2020 03/05/2020  WBC 4.0 - 10.5 K/uL 3.7(L) 4.1 7.1  Hemoglobin 12.0 - 15.0 g/dL 11.6(L) 11.6(L) 12.8  Hematocrit 36.0 - 46.0 % 34.8(L) 35.2(L) 39.1  Platelets 150 - 400 K/uL 140(L) 202 214    CMP Latest Ref Rng & Units 03/19/2020 03/05/2020 02/21/2020  Glucose 70 - 99 mg/dL 95 110(H) 96  BUN 8 - 23 mg/dL 6(L) 13 14  Creatinine 0.44 - 1.00 mg/dL 0.80 0.86 0.84  Sodium 135 - 145 mmol/L 143 142 140  Potassium 3.5 - 5.1 mmol/L 3.3(L) 3.6 3.4(L)  Chloride 98 - 111 mmol/L 108 109 103  CO2 22 - 32 mmol/L 28 24 26   Calcium 8.9 - 10.3 mg/dL 9.0 9.2 9.3  Total Protein 6.5 - 8.1 g/dL 7.2 7.6 8.0  Total Bilirubin 0.3 - 1.2 mg/dL 0.6 0.6  0.6  Alkaline Phos 38 - 126 U/L 86 81 80  AST 15 - 41 U/L 32 24 23  ALT 0 - 44 U/L 22 17 25      RADIOGRAPHIC STUDIES: No results found.  ASSESSMENT & PLAN Velina Drollinger 65 y.o. female with medical history significant for rectal cancer who presents for a follow up visit.  The current plan to treat the patient with FOLFOX chemotherapy every 2 weeks x8 cycles, for total of 16 weeks for the treatment.  Then we will start chemoradiation with capecitabine 825 mg per metered squared twice daily on days of radiation, typically for about 28 fractions.  Mrs. Brashears established with Dr. Lisbeth Renshaw who will be performing the radiation component of her treatment.  Once this is complete we will reevaluate with repeat imaging in proceed to surgery if feasible.  The patient and her daughter-in-law voiced their understanding of this plan moving forward.  Previously we discussed the FOLFOX chemotherapy treatment and the expected side effects including nausea, vomiting, diarrhea, neutropenia, nerve pain, and hand-foot syndrome.  Discussed that these are the most common and dangerous side effects, but is not limited to these alone.  The patient was agreeable to proceeding with FOLFOX chemotherapy at this time.  We will plan for chemotherapy education as well as port placement as soon as possible.  # Rectal Adenocarcainoma, Stage T3N1M0 -- Findings consistent with a stage III T3 N1 M0 rectal adenocarcinoma.  Treatment of choice would be total neoadjuvant chemotherapy/ chemoradiation followed by surgical resection. -- Started Cycle 1 Day 1 of FOLFOX chemotherapy on 03/05/2020.  Today is Cycle 3 Day 1.  -- initial pretreatment CEA found to be within normal limits, not likely to be of value moving forward.  --monitor CBC, CMP, and magnesium with each cycle. --RTC in 2 weeks for Cycle 4 of FOLFOX.   #Supportive Care --chemotherapy education completed --port in place --zofran 8mg  q8H PRN and compazine 10mg  PO q6H  for nausea -- EMLA cream for port -- no pain medication required at this time.   No orders of the defined types were placed in this encounter.  All  questions were answered. The patient knows to call the clinic with any problems, questions or concerns.  A total of more than 30 minutes were spent on this encounter and over half of that time was spent on counseling and coordination of care as outlined above.   Ledell Peoples, MD Department of Hematology/Oncology [place] at [facility] Phone: [phone] Pager: [phone] Email: [email or web address]  04/02/2020 8:43 AM

## 2020-04-02 NOTE — Patient Instructions (Signed)
[facility] Discharge Instructions for Patients Receiving Chemotherapy  Today you received the following chemotherapy agents Oxaliplatin (ELOXATIN), Leucovorin & Flourouracil (ADRUCIL).  To help prevent nausea and vomiting after your treatment, we encourage you to take your nausea medication as prescribed.   If you develop nausea and vomiting that is not controlled by your nausea medication, call the clinic.   BELOW ARE SYMPTOMS THAT SHOULD BE REPORTED IMMEDIATELY:  *FEVER GREATER THAN 100.5 F  *CHILLS WITH OR WITHOUT FEVER  NAUSEA AND VOMITING THAT IS NOT CONTROLLED WITH YOUR NAUSEA MEDICATION  *UNUSUAL SHORTNESS OF BREATH  *UNUSUAL BRUISING OR BLEEDING  TENDERNESS IN MOUTH AND THROAT WITH OR WITHOUT PRESENCE OF ULCERS  *URINARY PROBLEMS  *BOWEL PROBLEMS  UNUSUAL RASH Items with * indicate a potential emergency and should be followed up as soon as possible.  Feel free to call the clinic should you have any questions or concerns. The clinic phone number is [phone].  Please show the CHEMO ALERT CARD at check-in to the Emergency Department and triage nurse.   

## 2020-04-04 ENCOUNTER — Other Ambulatory Visit: Payer: Self-pay

## 2020-04-04 ENCOUNTER — Inpatient Hospital Stay: Payer: Medicare Other

## 2020-04-04 VITALS — BP 125/67 | HR 76 | Temp 98.8°F | Resp 17

## 2020-04-04 DIAGNOSIS — C20 Malignant neoplasm of rectum: Secondary | ICD-10-CM

## 2020-04-04 DIAGNOSIS — Z5111 Encounter for antineoplastic chemotherapy: Secondary | ICD-10-CM | POA: Diagnosis not present

## 2020-04-04 MED ORDER — HEPARIN SOD (PORK) LOCK FLUSH 100 UNIT/ML IV SOLN
500.0000 [IU] | Freq: Once | INTRAVENOUS | Status: AC | PRN
  Administered 2020-04-04: 500 [IU]
  Filled 2020-04-04: qty 5

## 2020-04-04 MED ORDER — SODIUM CHLORIDE 0.9% FLUSH
10.0000 mL | INTRAVENOUS | Status: DC | PRN
  Administered 2020-04-04: 10 mL
  Filled 2020-04-04: qty 10

## 2020-04-04 NOTE — Patient Instructions (Signed)
Implanted Port Insertion, Care After This sheet gives you information about how to care for yourself after your procedure. Your health care provider may also give you more specific instructions. If you have problems or questions, contact your health care provider. What can I expect after the procedure? After the procedure, it is common to have:  Discomfort at the port insertion site.  Bruising on the skin over the port. This should improve over 3-4 days. Follow these instructions at home: Port care  After your port is placed, you will get a manufacturer's information card. The card has information about your port. Keep this card with you at all times.  Take care of the port as told by your health care provider. Ask your health care provider if you or a family member can get training for taking care of the port at home. A home health care nurse may also take care of the port.  Make sure to remember what type of port you have. Incision care  Follow instructions from your health care provider about how to take care of your port insertion site. Make sure you: ? Wash your hands with soap and water before and after you change your bandage (dressing). If soap and water are not available, use hand sanitizer. ? Change your dressing as told by your health care provider. ? Leave stitches (sutures), skin glue, or adhesive strips in place. These skin closures may need to stay in place for 2 weeks or longer. If adhesive strip edges start to loosen and curl up, you may trim the loose edges. Do not remove adhesive strips completely unless your health care provider tells you to do that.  Check your port insertion site every day for signs of infection. Check for: ? Redness, swelling, or pain. ? Fluid or blood. ? Warmth. ? Pus or a bad smell.      Activity  Return to your normal activities as told by your health care provider. Ask your health care provider what activities are safe for you.  Do not  lift anything that is heavier than 10 lb (4.5 kg), or the limit that you are told, until your health care provider says that it is safe. General instructions  Take over-the-counter and prescription medicines only as told by your health care provider.  Do not take baths, swim, or use a hot tub until your health care provider approves. Ask your health care provider if you may take showers. You may only be allowed to take sponge baths.  Do not drive for 24 hours if you were given a sedative during your procedure.  Wear a medical alert bracelet in case of an emergency. This will tell any health care providers that you have a port.  Keep all follow-up visits as told by your health care provider. This is important. Contact a health care provider if:  You cannot flush your port with saline as directed, or you cannot draw blood from the port.  You have a fever or chills.  You have redness, swelling, or pain around your port insertion site.  You have fluid or blood coming from your port insertion site.  Your port insertion site feels warm to the touch.  You have pus or a bad smell coming from the port insertion site. Get help right away if:  You have chest pain or shortness of breath.  You have bleeding from your port that you cannot control. Summary  Take care of the port as told by your   health care provider. Keep the manufacturer's information card with you at all times.  Change your dressing as told by your health care provider.  Contact a health care provider if you have a fever or chills or if you have redness, swelling, or pain around your port insertion site.  Keep all follow-up visits as told by your health care provider. This information is not intended to replace advice given to you by your health care provider. Make sure you discuss any questions you have with your health care provider. Document Revised: 08/23/2017 Document Reviewed: 08/23/2017 Elsevier Patient Education   2021 Elsevier Inc.  

## 2020-04-08 ENCOUNTER — Telehealth: Payer: Self-pay

## 2020-04-08 NOTE — Telephone Encounter (Signed)
Left voice message for patient as follow up to previous phone call.  Per Dr. [name] he is recommending OTC Zyrtec taken daily.  He feels that she is not having an allergic reaction to Folfox but this is how her body is reacting to treatment/a sensitivity.  I also recommended using a humidifier and saline nose sprays.  I left my direct contact number for her to call back should she have any questions.

## 2020-04-08 NOTE — Progress Notes (Signed)
Patient calls with some concerns that she may be having a allergy to some of the treatment.  She states her first treatment was okay with minimal side effects however the second treatment almost the minute she got home she started sneezing and her nose ran constantly.  This was follow by her eye lids swelling. This happened again with her third and most recently treatment however seemed more severe.  Duration of these symptoms lasts up to 4 days.    I explained to her that the treatment she is on does effect her mucous membranes but that I would discuss this with Dr. [name] when he is back in the office tomorrow.  She also thinks she may now have a sinus infection and is going to reach out to her PCP for this.  She denies any fevers.

## 2020-04-16 ENCOUNTER — Inpatient Hospital Stay: Payer: Medicare Other

## 2020-04-16 ENCOUNTER — Other Ambulatory Visit: Payer: Self-pay

## 2020-04-16 ENCOUNTER — Encounter: Payer: Self-pay | Admitting: Hematology and Oncology

## 2020-04-16 ENCOUNTER — Inpatient Hospital Stay: Payer: Medicare Other | Attending: Hematology and Oncology

## 2020-04-16 ENCOUNTER — Inpatient Hospital Stay (HOSPITAL_BASED_OUTPATIENT_CLINIC_OR_DEPARTMENT_OTHER): Payer: Medicare Other | Admitting: Hematology and Oncology

## 2020-04-16 VITALS — BP 136/81 | HR 78 | Temp 97.9°F | Resp 16 | Ht 63.0 in | Wt 165.8 lb

## 2020-04-16 DIAGNOSIS — Z95828 Presence of other vascular implants and grafts: Secondary | ICD-10-CM

## 2020-04-16 DIAGNOSIS — L271 Localized skin eruption due to drugs and medicaments taken internally: Secondary | ICD-10-CM | POA: Diagnosis not present

## 2020-04-16 DIAGNOSIS — D6959 Other secondary thrombocytopenia: Secondary | ICD-10-CM | POA: Diagnosis not present

## 2020-04-16 DIAGNOSIS — D701 Agranulocytosis secondary to cancer chemotherapy: Secondary | ICD-10-CM | POA: Diagnosis not present

## 2020-04-16 DIAGNOSIS — Z5111 Encounter for antineoplastic chemotherapy: Secondary | ICD-10-CM | POA: Diagnosis not present

## 2020-04-16 DIAGNOSIS — R110 Nausea: Secondary | ICD-10-CM | POA: Insufficient documentation

## 2020-04-16 DIAGNOSIS — C20 Malignant neoplasm of rectum: Secondary | ICD-10-CM

## 2020-04-16 DIAGNOSIS — Z79899 Other long term (current) drug therapy: Secondary | ICD-10-CM | POA: Diagnosis not present

## 2020-04-16 LAB — CBC WITH DIFFERENTIAL (CANCER CENTER ONLY)
Abs Immature Granulocytes: 0.01 10*3/uL (ref 0.00–0.07)
Basophils Absolute: 0.1 10*3/uL (ref 0.0–0.1)
Basophils Relative: 3 %
Eosinophils Absolute: 0.1 10*3/uL (ref 0.0–0.5)
Eosinophils Relative: 2 %
HCT: 33.2 % — ABNORMAL LOW (ref 36.0–46.0)
Hemoglobin: 11.1 g/dL — ABNORMAL LOW (ref 12.0–15.0)
Immature Granulocytes: 0 %
Lymphocytes Relative: 42 %
Lymphs Abs: 1.5 10*3/uL (ref 0.7–4.0)
MCH: 29.9 pg (ref 26.0–34.0)
MCHC: 33.4 g/dL (ref 30.0–36.0)
MCV: 89.5 fL (ref 80.0–100.0)
Monocytes Absolute: 0.7 10*3/uL (ref 0.1–1.0)
Monocytes Relative: 19 %
Neutro Abs: 1.2 10*3/uL — ABNORMAL LOW (ref 1.7–7.7)
Neutrophils Relative %: 34 %
Platelet Count: 104 10*3/uL — ABNORMAL LOW (ref 150–400)
RBC: 3.71 MIL/uL — ABNORMAL LOW (ref 3.87–5.11)
RDW: 14.6 % (ref 11.5–15.5)
WBC Count: 3.6 10*3/uL — ABNORMAL LOW (ref 4.0–10.5)
nRBC: 0 % (ref 0.0–0.2)

## 2020-04-16 LAB — CMP (CANCER CENTER ONLY)
ALT: 28 U/L (ref 0–44)
AST: 34 U/L (ref 15–41)
Albumin: 3.5 g/dL (ref 3.5–5.0)
Alkaline Phosphatase: 89 U/L (ref 38–126)
Anion gap: 11 (ref 5–15)
BUN: 9 mg/dL (ref 8–23)
CO2: 24 mmol/L (ref 22–32)
Calcium: 9.1 mg/dL (ref 8.9–10.3)
Chloride: 108 mmol/L (ref 98–111)
Creatinine: 0.88 mg/dL (ref 0.44–1.00)
GFR, Estimated: >60 mL/min (ref >60)
Glucose, Bld: 112 mg/dL — ABNORMAL HIGH (ref 70–99)
Potassium: 3.1 mmol/L — ABNORMAL LOW (ref 3.5–5.1)
Sodium: 143 mmol/L (ref 135–145)
Total Bilirubin: 0.6 mg/dL (ref 0.3–1.2)
Total Protein: 7.1 g/dL (ref 6.5–8.1)

## 2020-04-16 LAB — MAGNESIUM: Magnesium: 2.2 mg/dL (ref 1.7–2.4)

## 2020-04-16 LAB — LACTATE DEHYDROGENASE: LDH: 184 U/L (ref 98–192)

## 2020-04-16 MED ORDER — PALONOSETRON HCL INJECTION 0.25 MG/5ML
INTRAVENOUS | Status: AC
  Filled 2020-04-16: qty 5

## 2020-04-16 MED ORDER — LEUCOVORIN CALCIUM INJECTION 350 MG
400.0000 mg/m2 | Freq: Once | INTRAVENOUS | Status: AC
  Administered 2020-04-16: 720 mg via INTRAVENOUS
  Filled 2020-04-16: qty 36

## 2020-04-16 MED ORDER — SODIUM CHLORIDE 0.9 % IV SOLN
10.0000 mg | Freq: Once | INTRAVENOUS | Status: AC
  Administered 2020-04-16: 10 mg via INTRAVENOUS
  Filled 2020-04-16: qty 10

## 2020-04-16 MED ORDER — OXALIPLATIN CHEMO INJECTION 100 MG/20ML
83.0000 mg/m2 | Freq: Once | INTRAVENOUS | Status: AC
  Administered 2020-04-16: 150 mg via INTRAVENOUS
  Filled 2020-04-16: qty 20

## 2020-04-16 MED ORDER — FLUOROURACIL CHEMO INJECTION 5 GM/100ML
2400.0000 mg/m2 | INTRAVENOUS | Status: DC
  Administered 2020-04-16: 4300 mg via INTRAVENOUS
  Filled 2020-04-16: qty 86

## 2020-04-16 MED ORDER — FLUOROURACIL CHEMO INJECTION 2.5 GM/50ML
400.0000 mg/m2 | Freq: Once | INTRAVENOUS | Status: AC
Start: 2020-04-16 — End: 2020-04-16
  Administered 2020-04-16: 700 mg via INTRAVENOUS
  Filled 2020-04-16: qty 14

## 2020-04-16 MED ORDER — DEXTROSE 5 % IV SOLN
Freq: Once | INTRAVENOUS | Status: AC
  Filled 2020-04-16: qty 250

## 2020-04-16 MED ORDER — PALONOSETRON HCL INJECTION 0.25 MG/5ML
0.2500 mg | Freq: Once | INTRAVENOUS | Status: AC
  Administered 2020-04-16: 0.25 mg via INTRAVENOUS

## 2020-04-16 MED ORDER — SODIUM CHLORIDE 0.9% FLUSH
10.0000 mL | Freq: Once | INTRAVENOUS | Status: AC
  Administered 2020-04-16: 10 mL
  Filled 2020-04-16: qty 10

## 2020-04-16 NOTE — Patient Instructions (Signed)
[facility] Discharge Instructions for Patients Receiving Chemotherapy  Today you received the following chemotherapy agents fluorourcil, oxaliplatin, leucovorin  To help prevent nausea and vomiting after your treatment, we encourage you to take your nausea medication as directed.   If you develop nausea and vomiting that is not controlled by your nausea medication, call the clinic.   BELOW ARE SYMPTOMS THAT SHOULD BE REPORTED IMMEDIATELY:  *FEVER GREATER THAN 100.5 F  *CHILLS WITH OR WITHOUT FEVER  NAUSEA AND VOMITING THAT IS NOT CONTROLLED WITH YOUR NAUSEA MEDICATION  *UNUSUAL SHORTNESS OF BREATH  *UNUSUAL BRUISING OR BLEEDING  TENDERNESS IN MOUTH AND THROAT WITH OR WITHOUT PRESENCE OF ULCERS  *URINARY PROBLEMS  *BOWEL PROBLEMS  UNUSUAL RASH Items with * indicate a potential emergency and should be followed up as soon as possible.  Feel free to call the clinic should you have any questions or concerns. The clinic phone number is [phone].  Please show the [place] at check-in to the Emergency Department and triage nurse.

## 2020-04-16 NOTE — Patient Instructions (Signed)

## 2020-04-16 NOTE — Progress Notes (Signed)
Connected with patient in treatment area.  She is currently taking Zyrtec to see if this helps with her previous experience with post treatment sneezing and runny nose.  He has developed Hand/Foot syndrome affecting her hands at this point.  Re-emphasized using a good cream several times a day.  She is currently using Udder Cream.  I suggested she could also try Cerave prior to going to bed at night and wearing cotton gloves to hold in the moisture.  She will try this.  I have encouraged her to call with any questions she may have.  She was appreciative of the visit.

## 2020-04-16 NOTE — Progress Notes (Signed)
Per Dr. [name], [facility] to treat with ANC 1.2

## 2020-04-16 NOTE — Progress Notes (Signed)
[place] [phone]   [phone]  PROGRESS NOTE  Patient Care Team: Patient, No Pcp Per as PCP - General (General Practice) [name], RN as Oncology Nurse Navigator [name], MD as Consulting Physician (Hematology and Oncology)  Hematological/Oncological History # Rectal Adenocarcainoma, Stage T3N1M0 1) 01/18/2020: colonoscopy due to bright red blood per rectum. A mass was found in the rectum 8 cm from the anal verge. Biopsy confirms moderately differentiated adenocarcinoma.  2) 01/29/2020: CT chest showed no evidence of metastatic disease. MRI showed a T2-T3 malignancy that appears to involve the internal sphincter and 2 mesorectal nodes 3) 02/07/2020: seen at [facility] by Oncologist Dr. Georgiann Cocker 4) 02/11/2020: seen at [facility] by Dr. [name] 5) 02/21/2020: establish care with Dr. [name]  6) 03/05/2020: Start of Neoadjuvant FOLFOX (anticipate 12-16 weeks of chemotherapy) 7) 03/19/2020: Cycle 2 Day 1 of Neoadjuvant FOLFOX  8) 04/02/2020: Cycle 3 Day 1 of Neoadjuvant FOLFOX  9) 04/16/2020: Cycle 4 Day 1 of Neoadjuvant FOLFOX   Interval History:  Cheyenne Scott 65 y.o. female with medical history significant for rectal cancer who presents for a follow up visit. The patient's last visit was on 04/02/2020. In the interim since the last visit Cheyenne Scott has completed Cycle 3 of chemotherapy.   On exam today Cheyenne Scott reports she developed dryness and cracking of her fingers with some erythema and mild tenderness at the fingertips since her last visit.  She reports that this markedly worsened overnight and she thinks she may have exposed her hands to high heat.  Her fingers have typically been numb and she was using gloves in order to handle objects, but she thinks this may be the possible cause.  She also reports that over the last cycle she had a lot of sneezing, and a "flushing nose" lasted for 4 days time.  In the  interim we have recommended that she begin taking antihistamines in order to try to help with this.  She is only having some mild dryness on her feet with no redness or cracking.  She is also endorses that she is not having any blood in the stool and that her appetite is good and weight has been stable.. She denies any fevers, chills, sweats, vomiting or diarrhea.  Full 10 point ROS is listed below.   MEDICAL HISTORY:  Past Medical History:  Diagnosis Date  . Acid reflux   . Allergy   . Rectal cancer [facility])     SURGICAL HISTORY: Past Surgical History:  Procedure Laterality Date  . ANTERIOR AND POSTERIOR REPAIR N/A 01/20/2015   Procedure: ANTERIOR (CYSTOCELE) AND POSTERIOR REPAIR (RECTOCELE) WITH PERINEORRHAPHY AND UTEROSACRAL LIGAMENT  SUSPENSION ;  Surgeon: Brien Few, MD;  Location: [place] [facility];  Service: Gynecology;  Laterality: N/A;  . IR IMAGING GUIDED PORT INSERTION  02/26/2020  . ROBOTIC ASSISTED TOTAL HYSTERECTOMY WITH SALPINGECTOMY Bilateral 01/20/2015   Procedure: ROBOTIC ASSISTED TOTAL HYSTERECTOMY WITH BILATERAL SALPINGECTOMY;  Surgeon: Brien Few, MD;  Location: [place] [facility];  Service: Gynecology;  Laterality: Bilateral;  . TUBAL LIGATION      SOCIAL HISTORY: Social History   Socioeconomic History  . Marital status: Widowed    Spouse name: Not on file  . Number of children: Not on file  . Years of education: Not on file  . Highest education level: Not on file  Occupational History  . Not on file  Tobacco Use  . Smoking status: Never Smoker  . Smokeless tobacco: Never  Used  Substance and Sexual Activity  . Alcohol use: No    Alcohol/week: 0.0 standard drinks  . Drug use: No  . Sexual activity: Not on file  Other Topics Concern  . Not on file  Social History Narrative  . Not on file   Social Determinants of Health   Financial Resource Strain: Not on file  Food Insecurity: Not on file  Transportation Needs: Not on file  Physical Activity: Not on file  Stress:  Not on file  Social Connections: Not on file  Intimate Partner Violence: Not on file    FAMILY HISTORY: Family History  Problem Relation Age of Onset  . Diabetes Mother   . Heart disease Mother   . Cancer Brother   . Stroke Maternal Grandmother     ALLERGIES:  is allergic to penicillins.  MEDICATIONS:  Current Outpatient Medications  Medication Sig Dispense Refill  . acetaminophen (TYLENOL) 325 MG tablet Take 650 mg by mouth every 6 (six) hours as needed.    [name] ascorbic acid (VITAMIN C) 250 MG tablet Take by mouth.    . Cholecalciferol (VITAMIN D3) 25 MCG (1000 UT) CAPS Take by mouth.    . estradiol (ESTRACE) 0.1 MG/GM vaginal cream Place vaginally.    [name] L-Lysine 500 MG TABS Take by mouth.    . lidocaine-prilocaine (EMLA) cream Apply 1 application topically as needed. 30 g 0  . Multiple Vitamin (MULTIVITAMIN WITH MINERALS) TABS tablet Take 1 tablet by mouth daily.    [name] omeprazole (PRILOSEC OTC) 20 MG tablet Take by mouth.    . ondansetron (ZOFRAN) 8 MG tablet Take 1 tablet (8 mg total) by mouth every 8 (eight) hours as needed for nausea or vomiting. 30 tablet 0  . potassium chloride SA (KLOR-CON) 20 MEQ tablet Take 2 tablets (40 mEq total) by mouth daily. 28 tablet 0  . prochlorperazine (COMPAZINE) 10 MG tablet Take 1 tablet (10 mg total) by mouth every 6 (six) hours as needed for nausea or vomiting. 30 tablet 0  . zinc gluconate 50 MG tablet Take by mouth.     No current facility-administered medications for this visit.    REVIEW OF SYSTEMS:   Constitutional: ( - ) fevers, ( - )  chills , ( - ) night sweats Eyes: ( - ) blurriness of vision, ( - ) double vision, ( - ) watery eyes Ears, nose, mouth, throat, and face: ( - ) mucositis, ( - ) sore throat Respiratory: ( - ) cough, ( - ) dyspnea, ( - ) wheezes Cardiovascular: ( - ) palpitation, ( - ) chest discomfort, ( - ) lower extremity swelling Gastrointestinal:  ( - ) nausea, ( - ) heartburn, ( - ) change in bowel habits Skin:  ( - ) abnormal skin rashes Lymphatics: ( - ) new lymphadenopathy, ( - ) easy bruising Neurological: ( - ) numbness, ( - ) tingling, ( - ) new weaknesses Behavioral/Psych: ( - ) mood change, ( - ) new changes  All other systems were reviewed with the patient and are negative.  PHYSICAL EXAMINATION: ECOG PERFORMANCE STATUS: 1 - Symptomatic but completely ambulatory  Vitals:   04/16/20 1016  BP: 136/81  Pulse: 78  Resp: 16  Temp: 97.9 F (36.6 C)  SpO2: 100%   Filed Weights   04/16/20 1016  Weight: 165 lb 12.8 oz (75.2 kg)    GENERAL: well appearing middle aged Caucasian female alert, no distress and comfortable SKIN: skin color, texture, turgor are normal,  no rashes or significant lesions EYES: conjunctiva are pink and non-injected, sclera clear LUNGS: clear to auscultation and percussion with normal breathing effort HEART: regular rate & rhythm and no murmurs and no lower extremity edema Musculoskeletal: no cyanosis of digits and no clubbing  PSYCH: alert & oriented x 3, fluent speech NEURO: no focal motor/sensory deficits  LABORATORY DATA:  I have reviewed the data as listed CBC Latest Ref Rng & Units 04/16/2020 04/02/2020 03/19/2020  WBC 4.0 - 10.5 K/uL 3.6(L) 3.7(L) 4.1  Hemoglobin 12.0 - 15.0 g/dL 11.1(L) 11.6(L) 11.6(L)  Hematocrit 36.0 - 46.0 % 33.2(L) 34.8(L) 35.2(L)  Platelets 150 - 400 K/uL 104(L) 140(L) 202    CMP Latest Ref Rng & Units 04/16/2020 04/02/2020 03/19/2020  Glucose 70 - 99 mg/dL 112(H) 108(H) 95  BUN 8 - 23 mg/dL 9 8 6(L)  Creatinine 0.44 - 1.00 mg/dL 0.88 0.92 0.80  Sodium 135 - 145 mmol/L 143 139 143  Potassium 3.5 - 5.1 mmol/L 3.1(L) 3.6 3.3(L)  Chloride 98 - 111 mmol/L 108 106 108  CO2 22 - 32 mmol/L 24 25 28   Calcium 8.9 - 10.3 mg/dL 9.1 9.2 9.0  Total Protein 6.5 - 8.1 g/dL 7.1 7.4 7.2  Total Bilirubin 0.3 - 1.2 mg/dL 0.6 0.6 0.6  Alkaline Phos 38 - 126 U/L 89 88 86  AST 15 - 41 U/L 34 39 32  ALT 0 - 44 U/L 28 37 22     RADIOGRAPHIC  STUDIES: No results found.  ASSESSMENT & PLAN Cheyenne Scott 65 y.o. female with medical history significant for rectal cancer who presents for a follow up visit.  The current plan to treat the patient with FOLFOX chemotherapy every 2 weeks x8 cycles, for total of 16 weeks for the treatment.  Then we will start chemoradiation with capecitabine 825 mg per metered squared twice daily on days of radiation, typically for about 28 fractions.  Cheyenne Scott established with Dr. Lisbeth Renshaw who will be performing the radiation component of her treatment.  Once this is complete we will reevaluate with repeat imaging in proceed to surgery if feasible.  The patient and her daughter-in-law voiced their understanding of this plan moving forward.  Previously we discussed the FOLFOX chemotherapy treatment and the expected side effects including nausea, vomiting, diarrhea, neutropenia, nerve pain, and hand-foot syndrome.  Discussed that these are the most common and dangerous side effects, but is not limited to these alone.  The patient was agreeable to proceeding with FOLFOX chemotherapy at this time.  We will plan for chemotherapy education as well as port placement as soon as possible.  # Rectal Adenocarcainoma, Stage T3N1M0 -- Findings consistent with a stage III T3 N1 M0 rectal adenocarcinoma.  Treatment of choice would be total neoadjuvant chemotherapy/ chemoradiation followed by surgical resection. -- Started Cycle 1 Day 1 of FOLFOX chemotherapy on 03/05/2020.  Today is Cycle 4 Day 1. Plan for 8 cycles total.  -- initial pretreatment CEA found to be within normal limits, not likely to be of value moving forward.  --monitor CBC, CMP, and magnesium with each cycle. --RTC in 2 weeks for Cycle 5 of FOLFOX.   #Hand Foot Syndrome, Grade II --developed after Cycle 3 --encourage use of moisturizer -continue to monitor   #Supportive Care --chemotherapy education completed --port in place --zofran 8mg  q8H PRN and  compazine 10mg  PO q6H for nausea -- EMLA cream for port -- no pain medication required at this time.   No orders of the defined types were  placed in this encounter.  All questions were answered. The patient knows to call the clinic with any problems, questions or concerns.  A total of more than 30 minutes were spent on this encounter and over half of that time was spent on counseling and coordination of care as outlined above.   Ledell Peoples, MD Department of Hematology/Oncology [place] at [facility] Phone: [phone] Pager: [phone] Email: [email or web address]  04/16/2020 10:46 AM

## 2020-04-17 ENCOUNTER — Other Ambulatory Visit: Payer: Self-pay

## 2020-04-17 ENCOUNTER — Telehealth: Payer: Self-pay

## 2020-04-17 MED ORDER — POTASSIUM CHLORIDE CRYS ER 20 MEQ PO TBCR: 40.0000 meq | EXTENDED_RELEASE_TABLET | Freq: Every day | ORAL | 0 refills | Status: DC

## 2020-04-17 NOTE — Telephone Encounter (Signed)
Patient calls stating she noticed her Potassium was 3.1 yesterday.  She states last time it was low she experienced horrible cramping and hands, legs and feet.  I have consulted with Dr. [name] and we are sending in a refill on the Potassium.  I called patient with this information and she knows we are sending in a two week supply and will recheck in at her next office visit.  She requested it be sent into the [facility] on file.  This has been done.

## 2020-04-18 ENCOUNTER — Inpatient Hospital Stay: Payer: Medicare Other

## 2020-04-18 ENCOUNTER — Other Ambulatory Visit: Payer: Self-pay

## 2020-04-18 VITALS — BP 151/74 | HR 79 | Resp 18

## 2020-04-18 DIAGNOSIS — C20 Malignant neoplasm of rectum: Secondary | ICD-10-CM

## 2020-04-18 DIAGNOSIS — Z5111 Encounter for antineoplastic chemotherapy: Secondary | ICD-10-CM | POA: Diagnosis not present

## 2020-04-18 DIAGNOSIS — Z95828 Presence of other vascular implants and grafts: Secondary | ICD-10-CM

## 2020-04-18 MED ORDER — SODIUM CHLORIDE 0.9% FLUSH
10.0000 mL | Freq: Once | INTRAVENOUS | Status: AC
Start: 2020-04-18 — End: 2020-04-18
  Administered 2020-04-18: 10 mL
  Filled 2020-04-18: qty 10

## 2020-04-18 MED ORDER — HEPARIN SOD (PORK) LOCK FLUSH 100 UNIT/ML IV SOLN
500.0000 [IU] | Freq: Once | INTRAVENOUS | Status: AC
Start: 2020-04-18 — End: 2020-04-18
  Administered 2020-04-18: 500 [IU]
  Filled 2020-04-18: qty 5

## 2020-04-18 NOTE — Patient Instructions (Signed)

## 2020-04-21 ENCOUNTER — Telehealth: Payer: Self-pay | Admitting: Hematology and Oncology

## 2020-04-21 NOTE — Telephone Encounter (Signed)
Called and left msg about scheduled appts from [place]. Also advised of change in provider for 3/23 appt.

## 2020-04-30 ENCOUNTER — Inpatient Hospital Stay (HOSPITAL_BASED_OUTPATIENT_CLINIC_OR_DEPARTMENT_OTHER): Payer: Medicare Other | Admitting: Physician Assistant

## 2020-04-30 ENCOUNTER — Other Ambulatory Visit: Payer: Self-pay | Admitting: *Deleted

## 2020-04-30 ENCOUNTER — Ambulatory Visit: Payer: BLUE CROSS/BLUE SHIELD | Admitting: Hematology and Oncology

## 2020-04-30 ENCOUNTER — Other Ambulatory Visit: Payer: Self-pay

## 2020-04-30 ENCOUNTER — Inpatient Hospital Stay: Payer: Medicare Other

## 2020-04-30 VITALS — BP 124/64 | HR 91 | Temp 97.5°F | Resp 20 | Ht 63.0 in | Wt 162.8 lb

## 2020-04-30 DIAGNOSIS — D701 Agranulocytosis secondary to cancer chemotherapy: Secondary | ICD-10-CM

## 2020-04-30 DIAGNOSIS — Z95828 Presence of other vascular implants and grafts: Secondary | ICD-10-CM

## 2020-04-30 DIAGNOSIS — E876 Hypokalemia: Secondary | ICD-10-CM | POA: Diagnosis not present

## 2020-04-30 DIAGNOSIS — D696 Thrombocytopenia, unspecified: Secondary | ICD-10-CM

## 2020-04-30 DIAGNOSIS — C20 Malignant neoplasm of rectum: Secondary | ICD-10-CM

## 2020-04-30 DIAGNOSIS — L271 Localized skin eruption due to drugs and medicaments taken internally: Secondary | ICD-10-CM

## 2020-04-30 DIAGNOSIS — Z5111 Encounter for antineoplastic chemotherapy: Secondary | ICD-10-CM | POA: Diagnosis not present

## 2020-04-30 DIAGNOSIS — D709 Neutropenia, unspecified: Secondary | ICD-10-CM | POA: Insufficient documentation

## 2020-04-30 DIAGNOSIS — T451X5A Adverse effect of antineoplastic and immunosuppressive drugs, initial encounter: Secondary | ICD-10-CM

## 2020-04-30 LAB — CMP (CANCER CENTER ONLY)
ALT: 26 U/L (ref 0–44)
AST: 34 U/L (ref 15–41)
Albumin: 3.4 g/dL — ABNORMAL LOW (ref 3.5–5.0)
Alkaline Phosphatase: 86 U/L (ref 38–126)
Anion gap: 10 (ref 5–15)
BUN: 7 mg/dL — ABNORMAL LOW (ref 8–23)
CO2: 24 mmol/L (ref 22–32)
Calcium: 9 mg/dL (ref 8.9–10.3)
Chloride: 110 mmol/L (ref 98–111)
Creatinine: 0.84 mg/dL (ref 0.44–1.00)
GFR, Estimated: >60 mL/min (ref >60)
Glucose, Bld: 127 mg/dL — ABNORMAL HIGH (ref 70–99)
Potassium: 3.1 mmol/L — ABNORMAL LOW (ref 3.5–5.1)
Sodium: 144 mmol/L (ref 135–145)
Total Bilirubin: 0.7 mg/dL (ref 0.3–1.2)
Total Protein: 6.8 g/dL (ref 6.5–8.1)

## 2020-04-30 LAB — CBC WITH DIFFERENTIAL (CANCER CENTER ONLY)
Abs Immature Granulocytes: 0.01 10*3/uL (ref 0.00–0.07)
Basophils Absolute: 0.1 10*3/uL (ref 0.0–0.1)
Basophils Relative: 2 %
Eosinophils Absolute: 0.1 10*3/uL (ref 0.0–0.5)
Eosinophils Relative: 3 %
HCT: 32.7 % — ABNORMAL LOW (ref 36.0–46.0)
Hemoglobin: 10.9 g/dL — ABNORMAL LOW (ref 12.0–15.0)
Immature Granulocytes: 0 %
Lymphocytes Relative: 44 %
Lymphs Abs: 1.3 10*3/uL (ref 0.7–4.0)
MCH: 30.2 pg (ref 26.0–34.0)
MCHC: 33.3 g/dL (ref 30.0–36.0)
MCV: 90.6 fL (ref 80.0–100.0)
Monocytes Absolute: 0.5 10*3/uL (ref 0.1–1.0)
Monocytes Relative: 18 %
Neutro Abs: 1 10*3/uL — ABNORMAL LOW (ref 1.7–7.7)
Neutrophils Relative %: 33 %
Platelet Count: 86 10*3/uL — ABNORMAL LOW (ref 150–400)
RBC: 3.61 MIL/uL — ABNORMAL LOW (ref 3.87–5.11)
RDW: 16.4 % — ABNORMAL HIGH (ref 11.5–15.5)
WBC Count: 2.9 10*3/uL — ABNORMAL LOW (ref 4.0–10.5)
nRBC: 0 % (ref 0.0–0.2)

## 2020-04-30 LAB — MAGNESIUM: Magnesium: 1.9 mg/dL (ref 1.7–2.4)

## 2020-04-30 LAB — LACTATE DEHYDROGENASE: LDH: 209 U/L — ABNORMAL HIGH (ref 98–192)

## 2020-04-30 MED ORDER — HEPARIN SOD (PORK) LOCK FLUSH 100 UNIT/ML IV SOLN
500.0000 [IU] | Freq: Once | INTRAVENOUS | Status: AC
  Administered 2020-04-30: 500 [IU] via INTRAVENOUS
  Filled 2020-04-30: qty 5

## 2020-04-30 MED ORDER — POTASSIUM CHLORIDE CRYS ER 20 MEQ PO TBCR
EXTENDED_RELEASE_TABLET | ORAL | Status: AC
  Filled 2020-04-30: qty 2

## 2020-04-30 MED ORDER — POTASSIUM CHLORIDE CRYS ER 20 MEQ PO TBCR
40.0000 meq | EXTENDED_RELEASE_TABLET | Freq: Once | ORAL | Status: AC
  Administered 2020-04-30: 40 meq via ORAL

## 2020-04-30 MED ORDER — SODIUM CHLORIDE 0.9% FLUSH
10.0000 mL | Freq: Once | INTRAVENOUS | Status: AC
  Administered 2020-04-30: 10 mL via INTRAVENOUS
  Filled 2020-04-30: qty 10

## 2020-04-30 MED ORDER — SODIUM CHLORIDE 0.9% FLUSH
10.0000 mL | Freq: Once | INTRAVENOUS | Status: AC
  Administered 2020-04-30: 10 mL
  Filled 2020-04-30: qty 10

## 2020-04-30 NOTE — Patient Instructions (Signed)
Implanted Port Insertion, Care After This sheet gives you information about how to care for yourself after your procedure. Your health care provider may also give you more specific instructions. If you have problems or questions, contact your health care provider. What can I expect after the procedure? After the procedure, it is common to have:  Discomfort at the port insertion site.  Bruising on the skin over the port. This should improve over 3-4 days. Follow these instructions at home: Port care  After your port is placed, you will get a manufacturer's information card. The card has information about your port. Keep this card with you at all times.  Take care of the port as told by your health care provider. Ask your health care provider if you or a family member can get training for taking care of the port at home. A home health care nurse may also take care of the port.  Make sure to remember what type of port you have. Incision care  Follow instructions from your health care provider about how to take care of your port insertion site. Make sure you: ? Wash your hands with soap and water before and after you change your bandage (dressing). If soap and water are not available, use hand sanitizer. ? Change your dressing as told by your health care provider. ? Leave stitches (sutures), skin glue, or adhesive strips in place. These skin closures may need to stay in place for 2 weeks or longer. If adhesive strip edges start to loosen and curl up, you may trim the loose edges. Do not remove adhesive strips completely unless your health care provider tells you to do that.  Check your port insertion site every day for signs of infection. Check for: ? Redness, swelling, or pain. ? Fluid or blood. ? Warmth. ? Pus or a bad smell.      Activity  Return to your normal activities as told by your health care provider. Ask your health care provider what activities are safe for you.  Do not  lift anything that is heavier than 10 lb (4.5 kg), or the limit that you are told, until your health care provider says that it is safe. General instructions  Take over-the-counter and prescription medicines only as told by your health care provider.  Do not take baths, swim, or use a hot tub until your health care provider approves. Ask your health care provider if you may take showers. You may only be allowed to take sponge baths.  Do not drive for 24 hours if you were given a sedative during your procedure.  Wear a medical alert bracelet in case of an emergency. This will tell any health care providers that you have a port.  Keep all follow-up visits as told by your health care provider. This is important. Contact a health care provider if:  You cannot flush your port with saline as directed, or you cannot draw blood from the port.  You have a fever or chills.  You have redness, swelling, or pain around your port insertion site.  You have fluid or blood coming from your port insertion site.  Your port insertion site feels warm to the touch.  You have pus or a bad smell coming from the port insertion site. Get help right away if:  You have chest pain or shortness of breath.  You have bleeding from your port that you cannot control. Summary  Take care of the port as told by your   health care provider. Keep the manufacturer's information card with you at all times.  Change your dressing as told by your health care provider.  Contact a health care provider if you have a fever or chills or if you have redness, swelling, or pain around your port insertion site.  Keep all follow-up visits as told by your health care provider. This information is not intended to replace advice given to you by your health care provider. Make sure you discuss any questions you have with your health care provider. Document Revised: 08/23/2017 Document Reviewed: 08/23/2017 Elsevier Patient Education   2021 Elsevier Inc.  

## 2020-04-30 NOTE — Progress Notes (Addendum)
[place] [phone]   [phone]  PROGRESS NOTE  Patient Care Team: Patient, No Pcp Per as PCP - General (General Practice) [name], RN as Oncology Nurse Navigator [name], MD as Consulting Physician (Hematology and Oncology)  Hematological/Oncological History # Rectal Adenocarcainoma, Stage T3N1M0 1) 01/18/2020: colonoscopy due to bright red blood per rectum. A mass was found in the rectum 8 cm from the anal verge. Biopsy confirms moderately differentiated adenocarcinoma.  2) 01/29/2020: CT chest showed no evidence of metastatic disease. MRI showed a T2-T3 malignancy that appears to involve the internal sphincter and 2 mesorectal nodes 3) 02/07/2020: seen at [facility] by Oncologist Dr. Georgiann Cocker 4) 02/11/2020: seen at [facility] by Dr. [name] 5) 02/21/2020: establish care with Dr. [name]  6) 03/05/2020: Start of Neoadjuvant FOLFOX (anticipate 12-16 weeks of chemotherapy) 7) 03/19/2020: Cycle 2 Day 1 of Neoadjuvant FOLFOX  8) 04/02/2020: Cycle 3 Day 1 of Neoadjuvant FOLFOX  9) 04/16/2020: Cycle 4 Day 1 of Neoadjuvant FOLFOX   Interval History:  Cheyenne Scott 65 y.o. female with medical history significant for rectal cancer who presents for a follow up visit. The patient's last visit was on 04/16/2020. In the interim since the last visit [name] has completed Cycle 4 of chemotherapy.   On exam today [name] reports stable fatigue for 3-4 days after chemotherapy. She continues to complete all her ADLs on her own. She reports decreased appetite mainly due to taste changes (metallic taste). She is trying to eat small, frequent meals throughout the day. She has mild nausea for 2-3 days following chemotherapy. Her nausea is well controlled with compazine once a day. She denies any vomiting episodes. Patient denies any abdominal pain. She has daily bowel movements without any evidence of hematochezia or melena.  Patient has cold sensitivity without any residual neuropathy. She continues to have feeling of the skin, mainly in the hands. After the last cycle, patient reports that there was worsening tenderness and swelling of the hands that lasted several days.  She noticed mild peeling in the feet only. She is compliant with apply Udder cream.  She denies any fevers, chills, shortness of breath, chest pain or cough. She has no other complaints. Rest of the 10 point ROS is listed below.   MEDICAL HISTORY:  Past Medical History:  Diagnosis Date  . Acid reflux   . Allergy   . Rectal cancer [facility])     SURGICAL HISTORY: Past Surgical History:  Procedure Laterality Date  . ANTERIOR AND POSTERIOR REPAIR N/A 01/20/2015   Procedure: ANTERIOR (CYSTOCELE) AND POSTERIOR REPAIR (RECTOCELE) WITH PERINEORRHAPHY AND UTEROSACRAL LIGAMENT  SUSPENSION ;  Surgeon: Brien Few, MD;  Location: [place];  Service: Gynecology;  Laterality: N/A;  . IR IMAGING GUIDED PORT INSERTION  02/26/2020  . ROBOTIC ASSISTED TOTAL HYSTERECTOMY WITH SALPINGECTOMY Bilateral 01/20/2015   Procedure: ROBOTIC ASSISTED TOTAL HYSTERECTOMY WITH BILATERAL SALPINGECTOMY;  Surgeon: Brien Few, MD;  Location: [facility] [place] [facility];  Service: Gynecology;  Laterality: Bilateral;  . TUBAL LIGATION      SOCIAL HISTORY: Social History   Socioeconomic History  . Marital status: Widowed    Spouse name: Not on file  . Number of children: Not on file  . Years of education: Not on file  . Highest education level: Not on file  Occupational History  . Not on file  Tobacco Use  . Smoking status: Never Smoker  . Smokeless tobacco: Never Used  Substance and Sexual Activity  .  Alcohol use: No    Alcohol/week: 0.0 standard drinks  . Drug use: No  . Sexual activity: Not on file  Other Topics Concern  . Not on file  Social History Narrative  . Not on file   Social Determinants of Health   Financial Resource Strain: Not on file  Food Insecurity: Not on file   Transportation Needs: Not on file  Physical Activity: Not on file  Stress: Not on file  Social Connections: Not on file  Intimate Partner Violence: Not on file    FAMILY HISTORY: Family History  Problem Relation Age of Onset  . Diabetes Mother   . Heart disease Mother   . Cancer Brother   . Stroke Maternal Grandmother     ALLERGIES:  is allergic to penicillins.  MEDICATIONS:  Current Outpatient Medications  Medication Sig Dispense Refill  . acetaminophen (TYLENOL) 325 MG tablet Take 650 mg by mouth every 6 (six) hours as needed.    [name] ascorbic acid (VITAMIN C) 250 MG tablet Take by mouth.    . Cholecalciferol (VITAMIN D3) 25 MCG (1000 UT) CAPS Take by mouth.    . estradiol (ESTRACE) 0.1 MG/GM vaginal cream Place vaginally.    [name] L-Lysine 500 MG TABS Take by mouth.    . lidocaine-prilocaine (EMLA) cream Apply 1 application topically as needed. 30 g 0  . Multiple Vitamin (MULTIVITAMIN WITH MINERALS) TABS tablet Take 1 tablet by mouth daily.    [name] omeprazole (PRILOSEC OTC) 20 MG tablet Take by mouth.    . ondansetron (ZOFRAN) 8 MG tablet Take 1 tablet (8 mg total) by mouth every 8 (eight) hours as needed for nausea or vomiting. 30 tablet 0  . potassium chloride SA (KLOR-CON) 20 MEQ tablet Take 2 tablets (40 mEq total) by mouth daily. 28 tablet 0  . prochlorperazine (COMPAZINE) 10 MG tablet Take 1 tablet (10 mg total) by mouth every 6 (six) hours as needed for nausea or vomiting. 30 tablet 0  . zinc gluconate 50 MG tablet Take by mouth.     No current facility-administered medications for this visit.    REVIEW OF SYSTEMS:   Constitutional: ( - ) fevers, ( - )  chills , ( - ) night sweats Eyes: ( - ) blurriness of vision, ( - ) double vision, ( - ) watery eyes Ears, nose, mouth, throat, and face: ( - ) mucositis, ( - ) sore throat Respiratory: ( - ) cough, ( - ) dyspnea, ( - ) wheezes Cardiovascular: ( - ) palpitation, ( - ) chest discomfort, ( - ) lower extremity  swelling Gastrointestinal:  ( - ) nausea, ( - ) heartburn, ( - ) change in bowel habits Skin: ( - ) abnormal skin rashes Lymphatics: ( - ) new lymphadenopathy, ( - ) easy bruising Neurological: ( - ) numbness, ( - ) tingling, ( - ) new weaknesses Behavioral/Psych: ( - ) mood change, ( - ) new changes  All other systems were reviewed with the patient and are negative.  PHYSICAL EXAMINATION: ECOG PERFORMANCE STATUS: 1 - Symptomatic but completely ambulatory  Vitals:   04/30/20 1040  BP: 124/64  Pulse: 91  Resp: 20  Temp: (!) 97.5 F (36.4 C)  SpO2: 100%   Filed Weights   04/30/20 1040  Weight: 162 lb 12.8 oz (73.8 kg)    GENERAL: well appearing middle aged Caucasian female alert, no distress and comfortable SKIN: skin color, texture, turgor are normal, no rashes or significant lesions.Peeling of skin  and erythema of hands and mild cracking of skin of feet.  EYES: conjunctiva are pink and non-injected, sclera clear LUNGS: clear to auscultation and percussion with normal breathing effort HEART: regular rate & rhythm and no murmurs and no lower extremity edema Musculoskeletal: no cyanosis of digits and no clubbing  PSYCH: alert & oriented x 3, fluent speech NEURO: no focal motor/sensory deficits  LABORATORY DATA:  I have reviewed the data as listed CBC Latest Ref Rng & Units 04/30/2020 04/16/2020 04/02/2020  WBC 4.0 - 10.5 K/uL 2.9(L) 3.6(L) 3.7(L)  Hemoglobin 12.0 - 15.0 g/dL 10.9(L) 11.1(L) 11.6(L)  Hematocrit 36.0 - 46.0 % 32.7(L) 33.2(L) 34.8(L)  Platelets 150 - 400 K/uL 86(L) 104(L) 140(L)    CMP Latest Ref Rng & Units 04/30/2020 04/16/2020 04/02/2020  Glucose 70 - 99 mg/dL 127(H) 112(H) 108(H)  BUN 8 - 23 mg/dL 7(L) 9 8  Creatinine 0.44 - 1.00 mg/dL 0.84 0.88 0.92  Sodium 135 - 145 mmol/L 144 143 139  Potassium 3.5 - 5.1 mmol/L 3.1(L) 3.1(L) 3.6  Chloride 98 - 111 mmol/L 110 108 106  CO2 22 - 32 mmol/L 24 24 25   Calcium 8.9 - 10.3 mg/dL 9.0 9.1 9.2  Total Protein 6.5 -  8.1 g/dL 6.8 7.1 7.4  Total Bilirubin 0.3 - 1.2 mg/dL 0.7 0.6 0.6  Alkaline Phos 38 - 126 U/L 86 89 88  AST 15 - 41 U/L 34 34 39  ALT 0 - 44 U/L 26 28 37     RADIOGRAPHIC STUDIES: No results found.  ASSESSMENT & PLAN Cheyenne Scott 65 y.o. female with medical history significant for rectal cancer who presents for a follow up visit.  The current plan to treat the patient with FOLFOX chemotherapy every 2 weeks x8 cycles, for total of 16 weeks for the treatment.  Then we will start chemoradiation with capecitabine 825 mg per metered squared twice daily on days of radiation, typically for about 28 fractions.  Mrs. Cantrall established with Dr. Lisbeth Renshaw who will be performing the radiation component of her treatment.  Once this is complete we will reevaluate with repeat imaging in proceed to surgery if feasible.  The patient and her daughter-in-law voiced their understanding of this plan moving forward.   # Rectal Adenocarcainoma, Stage T3N1M0 -- Findings consistent with a stage III T3 N1 M0 rectal adenocarcinoma.  Treatment of choice would be total neoadjuvant chemotherapy/ chemoradiation followed by surgical resection. -- initial pretreatment CEA found to be within normal limits, not likely to be of value moving forward.  -- Started Cycle 1 Day 1 of FOLFOX (out of a total of 8 cycles) chemotherapy on 03/05/2020.   --Patient presents today prior to Cycle 5 Day 1. Due to worsening thrombocytopenia and neutropenia along with hand-foot syndrome, plan to hold treatment today.  --monitor CBC, CMP, and magnesium with each cycle. --RTC in 2 weeks for Cycle 5 of FOLFOX.   #Hand Foot Syndrome, Grade II --developed after Cycle 3 --encourage use of moisturizer -continue to monitor   #Thrombocytopenia --secondary to chemotherapy --today platelet level is 86K, drop from 104K two weeks ago --patient reports occasional episodes of blood tinge nasal sputum without any active bleeding.  --see plan above to  hold chemotherapy for today.   #Neutropenia --secondary to chemotherapy --today's ANC is 1000 --patient denies any infectious symptoms including fevers or chills.  --precautions given to monitor for neutropenia fevers.  --see plan above to hold chemotherapy for today.   #Hypokalemia --Today's potassium level is 3.1.  --During  last visit, patient was given oral potassium prescription Direction is to take two tablets (40 mEq total) per day. Patient reports only taking one tablet per day.  --Gave 40 mEq of oral potassium in clinic today.   #Supportive Care --chemotherapy education completed --port in place --zofran 8mg  q8H PRN and compazine 10mg  PO q6H for nausea -- EMLA cream for port -- no pain medication required at this time.   No orders of the defined types were placed in this encounter.  All questions were answered. The patient knows to call the clinic with any problems, questions or concerns.  A total of more than 30 minutes were spent on this encounter and over half of that time was spent on counseling and coordination of care as outlined above.   Dede Query PA-C Department of Hematology/Oncology [place] at [facility] Phone: [phone]  04/30/2020 3:12 PM

## 2020-05-01 ENCOUNTER — Telehealth: Payer: Self-pay | Admitting: Physician Assistant

## 2020-05-01 NOTE — Telephone Encounter (Signed)
Checked out appointment. No LOS notes needing to be scheduled. No changes made. 

## 2020-05-02 ENCOUNTER — Inpatient Hospital Stay: Payer: Medicare Other

## 2020-05-13 NOTE — Progress Notes (Signed)
[place] Telephone:(336) [phone]   Fax:(336) [phone]  PROGRESS NOTE  Patient Care Team: Patient, No Pcp Per (Inactive) as PCP - General (General Practice) [name], RN as Oncology Nurse Navigator [name], MD as Consulting Physician (Hematology and Oncology)  Hematological/Oncological History # Rectal Adenocarcainoma, Stage T3N1M0 1) 01/18/2020: colonoscopy due to bright red blood per rectum. A mass was found in the rectum 8 cm from the anal verge. Biopsy confirms moderately differentiated adenocarcinoma.  2) 01/29/2020: CT chest showed no evidence of metastatic disease. MRI showed a T2-T3 malignancy that appears to involve the internal sphincter and 2 mesorectal nodes 3) 02/07/2020: seen at [facility] by Oncologist Dr. Georgiann Cocker 4) 02/11/2020: seen at [facility] by Dr. [name] 5) 02/21/2020: establish care with Dr. 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 female with medical history significant for rectal cancer who presents for a follow up visit. The patient's last visit was on 04/30/2020. In the interim since the last visit Cheyenne Scott had to hold Cycle 5 due to HFS.   On exam today Cheyenne Scott reports the skin issues on her hands is improving.  She notes that she is not having as much pain but she does continue to have dryness.  She is moisturizing multiple times per day in order to try to help with the dry skin.  She does not have any clear cracks and no pain or burning at this time.  She reports that while she was off chemotherapy she did have a great  improvement in her appetite and enjoyed drinking cold drinks.  She is not had any issues with nausea, vomiting, or diarrhea.  She notes that she has not had more than 2 days with nausea since we first started treatment.  She notes that she does eat smaller meals throughout the day.  Her fatigue is increasing, but otherwise she has been well.  She is also endorses that she is not having any blood in the stool and that her appetite is good and weight has been stable.. She denies any fevers, chills, sweats, vomiting or diarrhea.  Full 10 point ROS is listed below.   MEDICAL HISTORY:  Past Medical History:  Diagnosis Date  . Acid reflux   . Allergy   . Rectal cancer [facility])     SURGICAL HISTORY: Past Surgical History:  Procedure Laterality Date  . ANTERIOR AND POSTERIOR REPAIR N/A 01/20/2015   Procedure: ANTERIOR (CYSTOCELE) AND POSTERIOR REPAIR (RECTOCELE) WITH PERINEORRHAPHY AND UTEROSACRAL LIGAMENT  SUSPENSION ;  Surgeon: Brien Few, MD;  Location: [facility];  Service: Gynecology;  Laterality: N/A;  . IR IMAGING GUIDED PORT INSERTION  02/26/2020  . ROBOTIC ASSISTED TOTAL HYSTERECTOMY WITH SALPINGECTOMY Bilateral 01/20/2015   Procedure: ROBOTIC ASSISTED TOTAL HYSTERECTOMY WITH BILATERAL SALPINGECTOMY;  Surgeon: Brien Few, MD;  Location: [facility];  Service: Gynecology;  Laterality: Bilateral;  . TUBAL LIGATION      SOCIAL HISTORY: Social History   Socioeconomic History  . Marital status: Widowed    Spouse name: Not on file  . Number of children: Not on  file  . Years of education: Not on file  . Highest education level: Not on file  Occupational History  . Not on file  Tobacco Use  . Smoking status: Never Smoker  . Smokeless tobacco: Never Used  Substance and Sexual Activity  . Alcohol use: No    Alcohol/week: 0.0 standard drinks  . Drug use: No  . Sexual activity: Not on file  Other Topics Concern  . Not on file  Social History Narrative  . Not on file   Social Determinants  of Health   Financial Resource Strain: Not on file  Food Insecurity: Not on file  Transportation Needs: Not on file  Physical Activity: Not on file  Stress: Not on file  Social Connections: Not on file  Intimate Partner Violence: Not on file    FAMILY HISTORY: Family History  Problem Relation Age of Onset  . Diabetes Mother   . Heart disease Mother   . Cancer Brother   . Stroke Maternal Grandmother     ALLERGIES:  is allergic to penicillins.  MEDICATIONS:  Current Outpatient Medications  Medication Sig Dispense Refill  . acetaminophen (TYLENOL) 325 MG tablet Take 650 mg by mouth every 6 (six) hours as needed.    [name] ascorbic acid (VITAMIN C) 250 MG tablet Take by mouth.    . Cholecalciferol (VITAMIN D3) 25 MCG (1000 UT) CAPS Take by mouth.    . estradiol (ESTRACE) 0.1 MG/GM vaginal cream Place vaginally.    [name] L-Lysine 500 MG TABS Take by mouth.    . lidocaine-prilocaine (EMLA) cream Apply 1 application topically as needed. 30 g 0  . Multiple Vitamin (MULTIVITAMIN WITH MINERALS) TABS tablet Take 1 tablet by mouth daily.    [name] omeprazole (PRILOSEC OTC) 20 MG tablet Take by mouth.    . ondansetron (ZOFRAN) 8 MG tablet Take 1 tablet (8 mg total) by mouth every 8 (eight) hours as needed for nausea or vomiting. 30 tablet 0  . potassium chloride SA (KLOR-CON) 20 MEQ tablet Take 2 tablets (40 mEq total) by mouth daily. 28 tablet 0  . prochlorperazine (COMPAZINE) 10 MG tablet Take 1 tablet (10 mg total) by mouth every 6 (six) hours as needed for nausea or vomiting. 30 tablet 0  . zinc gluconate 50 MG tablet Take by mouth.     No current facility-administered medications for this visit.    REVIEW OF SYSTEMS:   Constitutional: ( - ) fevers, ( - )  chills , ( - ) night sweats Eyes: ( - ) blurriness of vision, ( - ) double vision, ( - ) watery eyes Ears, nose, mouth, throat, and face: ( - ) mucositis, ( - ) sore throat Respiratory: ( - ) cough, ( - ) dyspnea, ( - )  wheezes Cardiovascular: ( - ) palpitation, ( - ) chest discomfort, ( - ) lower extremity swelling Gastrointestinal:  ( - ) nausea, ( - ) heartburn, ( - ) change in bowel habits Skin: ( - ) abnormal skin rashes Lymphatics: ( - ) new lymphadenopathy, ( - ) easy bruising Neurological: ( - ) numbness, ( - ) tingling, ( - ) new weaknesses Behavioral/Psych: ( - ) mood change, ( - ) new changes  All other systems were reviewed with the patient and are negative.  PHYSICAL EXAMINATION: ECOG PERFORMANCE STATUS: 1 - Symptomatic but completely ambulatory  Vitals:   05/14/20 1146  BP: (!) 147/62  Pulse: 79  Resp: 15  Temp: 97.6 F (36.4 C)  SpO2: 100%   Filed Weights   05/14/20 1146  Weight: 165 lb (74.8 kg)    GENERAL: well appearing middle aged Caucasian female alert, no distress and comfortable SKIN: erythema and dry, peeling skin noted on hands bilaterally. No palmar involvement.  EYES: conjunctiva are pink and non-injected, sclera clear LUNGS: clear to auscultation and percussion with normal breathing effort HEART: regular rate & rhythm and no murmurs and no lower extremity edema Musculoskeletal: no cyanosis of digits and no clubbing  PSYCH: alert & oriented x 3, fluent speech NEURO: no focal motor/sensory deficits  LABORATORY DATA:  I have reviewed the data as listed CBC Latest Ref Rng & Units 05/14/2020 04/30/2020 04/16/2020  WBC 4.0 - 10.5 K/uL 7.1 2.9(L) 3.6(L)  Hemoglobin 12.0 - 15.0 g/dL 12.2 10.9(L) 11.1(L)  Hematocrit 36.0 - 46.0 % 37.5 32.7(L) 33.2(L)  Platelets 150 - 400 K/uL 217 86(L) 104(L)    CMP Latest Ref Rng & Units 05/14/2020 04/30/2020 04/16/2020  Glucose 70 - 99 mg/dL 93 127(H) 112(H)  BUN 8 - 23 mg/dL 9 7(L) 9  Creatinine 0.44 - 1.00 mg/dL 0.91 0.84 0.88  Sodium 135 - 145 mmol/L 143 144 143  Potassium 3.5 - 5.1 mmol/L 3.7 3.1(L) 3.1(L)  Chloride 98 - 111 mmol/L 106 110 108  CO2 22 - 32 mmol/L 24 24 24   Calcium 8.9 - 10.3 mg/dL 9.2 9.0 9.1  Total Protein 6.5 -  8.1 g/dL 7.6 6.8 7.1  Total Bilirubin 0.3 - 1.2 mg/dL 0.7 0.7 0.6  Alkaline Phos 38 - 126 U/L 106 86 89  AST 15 - 41 U/L 48(H) 34 34  ALT 0 - 44 U/L 36 26 28     RADIOGRAPHIC STUDIES: No results found.  ASSESSMENT & PLAN Cheyenne Scott 65 y.o. female with medical history significant for rectal cancer who presents for a follow up visit.  The current plan to treat the patient with FOLFOX chemotherapy every 2 weeks x8 cycles, for total of 16 weeks for the treatment.  Then we will start chemoradiation with capecitabine 825 mg per metered squared twice daily on days of radiation, typically for about 28 fractions.  Cheyenne Scott established with Dr. Lisbeth Renshaw who will be performing the radiation component of her treatment.  Once this is complete we will reevaluate with repeat imaging in proceed to surgery if feasible.  The patient and her daughter-in-law voiced their understanding of this plan moving forward.  Previously we discussed the FOLFOX chemotherapy treatment and the expected side effects including nausea, vomiting, diarrhea, neutropenia, nerve pain, and hand-foot syndrome.  Discussed that these are the most common and dangerous side effects, but is not limited to these alone.  The patient was agreeable to proceeding with FOLFOX chemotherapy at this time.  We will plan for chemotherapy education as well as port placement as soon as possible.  # Rectal Adenocarcainoma, Stage T3N1M0 -- Findings consistent with a stage III T3 N1 M0 rectal adenocarcinoma.  Treatment of choice would be total neoadjuvant chemotherapy/ chemoradiation followed by surgical resection. -- Started Cycle 1 Day 1 of FOLFOX chemotherapy on 03/05/2020.  Today is Cycle 5 Day 1. Plan for 8 cycles total.  -- initial pretreatment CEA found to be within normal limits, not likely to be of value moving forward.  --dose reduction of 25% for the Fluorouracil started on 05/14/2020 due to HFS.  --monitor CBC, CMP, and magnesium with each  cycle. --RTC in 2 weeks for Cycle 6 of FOLFOX.   #Hand Foot Syndrome, Grade II --  developed after Cycle 3 --encourage use of moisturizer -continue to monitor   #Supportive Care --chemotherapy education completed --port in place --zofran 8mg  q8H PRN and compazine 10mg  PO q6H for nausea -- EMLA cream for port -- no pain medication required at this time.   No orders of the defined types were placed in this encounter.  All questions were answered. The patient knows to call the clinic with any problems, questions or concerns.  A total of more than 30 minutes were spent on this encounter and over half of that time was spent on counseling and coordination of care as outlined above.   Ledell Peoples, MD Department of Hematology/Oncology [place] [facility] Phone: [phone] Pager: [phone] Email: [email or web address]  05/20/2020 12:18 PM

## 2020-05-14 ENCOUNTER — Inpatient Hospital Stay: Payer: Medicare Other

## 2020-05-14 ENCOUNTER — Inpatient Hospital Stay: Payer: Medicare Other | Admitting: Dietician

## 2020-05-14 ENCOUNTER — Other Ambulatory Visit: Payer: Self-pay

## 2020-05-14 ENCOUNTER — Inpatient Hospital Stay: Payer: Medicare Other | Attending: Hematology and Oncology | Admitting: Hematology and Oncology

## 2020-05-14 VITALS — BP 147/62 | HR 79 | Temp 97.6°F | Resp 15 | Ht 63.0 in | Wt 165.0 lb

## 2020-05-14 DIAGNOSIS — Z79899 Other long term (current) drug therapy: Secondary | ICD-10-CM | POA: Insufficient documentation

## 2020-05-14 DIAGNOSIS — Z5111 Encounter for antineoplastic chemotherapy: Secondary | ICD-10-CM | POA: Insufficient documentation

## 2020-05-14 DIAGNOSIS — C20 Malignant neoplasm of rectum: Secondary | ICD-10-CM | POA: Diagnosis present

## 2020-05-14 DIAGNOSIS — L271 Localized skin eruption due to drugs and medicaments taken internally: Secondary | ICD-10-CM | POA: Diagnosis not present

## 2020-05-14 DIAGNOSIS — K219 Gastro-esophageal reflux disease without esophagitis: Secondary | ICD-10-CM | POA: Insufficient documentation

## 2020-05-14 DIAGNOSIS — R5383 Other fatigue: Secondary | ICD-10-CM | POA: Diagnosis not present

## 2020-05-14 DIAGNOSIS — Z95828 Presence of other vascular implants and grafts: Secondary | ICD-10-CM | POA: Diagnosis not present

## 2020-05-14 DIAGNOSIS — T451X5A Adverse effect of antineoplastic and immunosuppressive drugs, initial encounter: Secondary | ICD-10-CM

## 2020-05-14 DIAGNOSIS — D701 Agranulocytosis secondary to cancer chemotherapy: Secondary | ICD-10-CM

## 2020-05-14 LAB — CBC WITH DIFFERENTIAL (CANCER CENTER ONLY)
Abs Immature Granulocytes: 0.03 10*3/uL (ref 0.00–0.07)
Basophils Absolute: 0.2 10*3/uL — ABNORMAL HIGH (ref 0.0–0.1)
Basophils Relative: 2 %
Eosinophils Absolute: 0.1 10*3/uL (ref 0.0–0.5)
Eosinophils Relative: 1 %
HCT: 37.5 % (ref 36.0–46.0)
Hemoglobin: 12.2 g/dL (ref 12.0–15.0)
Immature Granulocytes: 0 %
Lymphocytes Relative: 34 %
Lymphs Abs: 2.4 10*3/uL (ref 0.7–4.0)
MCH: 30.3 pg (ref 26.0–34.0)
MCHC: 32.5 g/dL (ref 30.0–36.0)
MCV: 93.3 fL (ref 80.0–100.0)
Monocytes Absolute: 0.8 10*3/uL (ref 0.1–1.0)
Monocytes Relative: 11 %
Neutro Abs: 3.7 10*3/uL (ref 1.7–7.7)
Neutrophils Relative %: 52 %
Platelet Count: 217 10*3/uL (ref 150–400)
RBC: 4.02 MIL/uL (ref 3.87–5.11)
RDW: 16.1 % — ABNORMAL HIGH (ref 11.5–15.5)
WBC Count: 7.1 10*3/uL (ref 4.0–10.5)
nRBC: 0 % (ref 0.0–0.2)

## 2020-05-14 LAB — CMP (CANCER CENTER ONLY)
ALT: 36 U/L (ref 0–44)
AST: 48 U/L — ABNORMAL HIGH (ref 15–41)
Albumin: 3.9 g/dL (ref 3.5–5.0)
Alkaline Phosphatase: 106 U/L (ref 38–126)
Anion gap: 13 (ref 5–15)
BUN: 9 mg/dL (ref 8–23)
CO2: 24 mmol/L (ref 22–32)
Calcium: 9.2 mg/dL (ref 8.9–10.3)
Chloride: 106 mmol/L (ref 98–111)
Creatinine: 0.91 mg/dL (ref 0.44–1.00)
GFR, Estimated: >60 mL/min (ref >60)
Glucose, Bld: 93 mg/dL (ref 70–99)
Potassium: 3.7 mmol/L (ref 3.5–5.1)
Sodium: 143 mmol/L (ref 135–145)
Total Bilirubin: 0.7 mg/dL (ref 0.3–1.2)
Total Protein: 7.6 g/dL (ref 6.5–8.1)

## 2020-05-14 LAB — LACTATE DEHYDROGENASE: LDH: 201 U/L — ABNORMAL HIGH (ref 98–192)

## 2020-05-14 LAB — MAGNESIUM: Magnesium: 2 mg/dL (ref 1.7–2.4)

## 2020-05-14 MED ORDER — DEXTROSE 5 % IV SOLN
Freq: Once | INTRAVENOUS | Status: AC
  Filled 2020-05-14: qty 250

## 2020-05-14 MED ORDER — LEUCOVORIN CALCIUM INJECTION 350 MG
300.0000 mg/m2 | Freq: Once | INTRAVENOUS | Status: AC
  Administered 2020-05-14: 540 mg via INTRAVENOUS
  Filled 2020-05-14: qty 27

## 2020-05-14 MED ORDER — SODIUM CHLORIDE 0.9 % IV SOLN
10.0000 mg | Freq: Once | INTRAVENOUS | Status: AC
  Administered 2020-05-14: 10 mg via INTRAVENOUS
  Filled 2020-05-14: qty 1
  Filled 2020-05-14: qty 10

## 2020-05-14 MED ORDER — FLUOROURACIL CHEMO INJECTION 2.5 GM/50ML
300.0000 mg/m2 | Freq: Once | INTRAVENOUS | Status: AC
  Administered 2020-05-14: 550 mg via INTRAVENOUS
  Filled 2020-05-14: qty 11

## 2020-05-14 MED ORDER — SODIUM CHLORIDE 0.9% FLUSH
10.0000 mL | Freq: Once | INTRAVENOUS | Status: AC
Start: 2020-05-14 — End: 2020-05-14
  Administered 2020-05-14: 10 mL
  Filled 2020-05-14: qty 10

## 2020-05-14 MED ORDER — PALONOSETRON HCL INJECTION 0.25 MG/5ML
INTRAVENOUS | Status: AC
  Filled 2020-05-14: qty 5

## 2020-05-14 MED ORDER — PALONOSETRON HCL INJECTION 0.25 MG/5ML
0.2500 mg | Freq: Once | INTRAVENOUS | Status: AC
  Administered 2020-05-14: 0.25 mg via INTRAVENOUS

## 2020-05-14 MED ORDER — OXALIPLATIN CHEMO INJECTION 100 MG/20ML
85.0000 mg/m2 | Freq: Once | INTRAVENOUS | Status: AC
  Administered 2020-05-14: 155 mg via INTRAVENOUS
  Filled 2020-05-14: qty 31

## 2020-05-14 MED ORDER — SODIUM CHLORIDE 0.9 % IV SOLN
1800.0000 mg/m2 | INTRAVENOUS | Status: DC
  Administered 2020-05-14: 3250 mg via INTRAVENOUS
  Filled 2020-05-14: qty 65

## 2020-05-14 NOTE — Patient Instructions (Signed)
Implanted Port Insertion, Care After This sheet gives you information about how to care for yourself after your procedure. Your health care provider may also give you more specific instructions. If you have problems or questions, contact your health care provider. What can I expect after the procedure? After the procedure, it is common to have:  Discomfort at the port insertion site.  Bruising on the skin over the port. This should improve over 3-4 days. Follow these instructions at home: Port care  After your port is placed, you will get a manufacturer's information card. The card has information about your port. Keep this card with you at all times.  Take care of the port as told by your health care provider. Ask your health care provider if you or a family member can get training for taking care of the port at home. A home health care nurse may also take care of the port.  Make sure to remember what type of port you have. Incision care  Follow instructions from your health care provider about how to take care of your port insertion site. Make sure you: ? Wash your hands with soap and water before and after you change your bandage (dressing). If soap and water are not available, use hand sanitizer. ? Change your dressing as told by your health care provider. ? Leave stitches (sutures), skin glue, or adhesive strips in place. These skin closures may need to stay in place for 2 weeks or longer. If adhesive strip edges start to loosen and curl up, you may trim the loose edges. Do not remove adhesive strips completely unless your health care provider tells you to do that.  Check your port insertion site every day for signs of infection. Check for: ? Redness, swelling, or pain. ? Fluid or blood. ? Warmth. ? Pus or a bad smell.      Activity  Return to your normal activities as told by your health care provider. Ask your health care provider what activities are safe for you.  Do not  lift anything that is heavier than 10 lb (4.5 kg), or the limit that you are told, until your health care provider says that it is safe. General instructions  Take over-the-counter and prescription medicines only as told by your health care provider.  Do not take baths, swim, or use a hot tub until your health care provider approves. Ask your health care provider if you may take showers. You may only be allowed to take sponge baths.  Do not drive for 24 hours if you were given a sedative during your procedure.  Wear a medical alert bracelet in case of an emergency. This will tell any health care providers that you have a port.  Keep all follow-up visits as told by your health care provider. This is important. Contact a health care provider if:  You cannot flush your port with saline as directed, or you cannot draw blood from the port.  You have a fever or chills.  You have redness, swelling, or pain around your port insertion site.  You have fluid or blood coming from your port insertion site.  Your port insertion site feels warm to the touch.  You have pus or a bad smell coming from the port insertion site. Get help right away if:  You have chest pain or shortness of breath.  You have bleeding from your port that you cannot control. Summary  Take care of the port as told by your   health care provider. Keep the manufacturer's information card with you at all times.  Change your dressing as told by your health care provider.  Contact a health care provider if you have a fever or chills or if you have redness, swelling, or pain around your port insertion site.  Keep all follow-up visits as told by your health care provider. This information is not intended to replace advice given to you by your health care provider. Make sure you discuss any questions you have with your health care provider. Document Revised: 08/23/2017 Document Reviewed: 08/23/2017 Elsevier Patient Education   2021 Elsevier Inc.  

## 2020-05-14 NOTE — Progress Notes (Signed)
Decrease 5FU by 25% d/t toxicity per Dr [name]

## 2020-05-14 NOTE — Progress Notes (Signed)
Nutrition Assessment   Reason for Assessment: MST (+wt loss)   ASSESSMENT: 65 year old female with rectal cancer receiving neoadjuvant FOLFOX followed by chemoradiation with capecitabine. She is followed by Dr. [name]  Met with patient during infusion. She reports good appetite and eating well. Patient had to hold cycle 5 on 3/23 due to HFS. She reports eating more as well as improved pain and peeling of hands with having an extra week off. She endorses slight decrease in weight now resolved and maintaining usual weight. Patient denies  nutrition impact symptoms.   Medications: D3, Vit C, Zofran, Klor-con, Compazine  Labs: reviewed    Anthropometrics: Weight 165 lb today increased 3 lbs from 162 lb 12.8 oz on 3/23 down from 165 lb 6.4 oz on 2/9  Height: 5'3" Weight: 74.8 kg UBW: 163-168 lb (01/22-04/22) BMI: 29.23   NUTRITION DIAGNOSIS: No nutrition diagnosis at this time   INTERVENTION Discussed nutrition services offered and encouraged to contact if nutrition impact symptoms arise RD contact information given  MONITORING, EVALUATION, GOAL: Patient will continue to consume adequate calories and protein to minimize weight loss   Next Visit: no follow-up needs at this time

## 2020-05-16 ENCOUNTER — Inpatient Hospital Stay: Payer: Medicare Other

## 2020-05-16 ENCOUNTER — Other Ambulatory Visit: Payer: Self-pay

## 2020-05-16 VITALS — BP 138/72 | HR 70 | Temp 98.4°F | Resp 16

## 2020-05-16 DIAGNOSIS — C20 Malignant neoplasm of rectum: Secondary | ICD-10-CM

## 2020-05-16 DIAGNOSIS — Z5111 Encounter for antineoplastic chemotherapy: Secondary | ICD-10-CM | POA: Diagnosis not present

## 2020-05-16 MED ORDER — HEPARIN SOD (PORK) LOCK FLUSH 100 UNIT/ML IV SOLN
500.0000 [IU] | Freq: Once | INTRAVENOUS | Status: AC | PRN
  Administered 2020-05-16: 500 [IU]
  Filled 2020-05-16: qty 5

## 2020-05-16 MED ORDER — SODIUM CHLORIDE 0.9% FLUSH
10.0000 mL | INTRAVENOUS | Status: DC | PRN
  Administered 2020-05-16: 10 mL
  Filled 2020-05-16: qty 10

## 2020-05-16 NOTE — Patient Instructions (Signed)
Implanted Port Insertion, Care After This sheet gives you information about how to care for yourself after your procedure. Your health care provider may also give you more specific instructions. If you have problems or questions, contact your health care provider. What can I expect after the procedure? After the procedure, it is common to have:  Discomfort at the port insertion site.  Bruising on the skin over the port. This should improve over 3-4 days. Follow these instructions at home: Port care  After your port is placed, you will get a manufacturer's information card. The card has information about your port. Keep this card with you at all times.  Take care of the port as told by your health care provider. Ask your health care provider if you or a family member can get training for taking care of the port at home. A home health care nurse may also take care of the port.  Make sure to remember what type of port you have. Incision care  Follow instructions from your health care provider about how to take care of your port insertion site. Make sure you: ? Wash your hands with soap and water before and after you change your bandage (dressing). If soap and water are not available, use hand sanitizer. ? Change your dressing as told by your health care provider. ? Leave stitches (sutures), skin glue, or adhesive strips in place. These skin closures may need to stay in place for 2 weeks or longer. If adhesive strip edges start to loosen and curl up, you may trim the loose edges. Do not remove adhesive strips completely unless your health care provider tells you to do that.  Check your port insertion site every day for signs of infection. Check for: ? Redness, swelling, or pain. ? Fluid or blood. ? Warmth. ? Pus or a bad smell.      Activity  Return to your normal activities as told by your health care provider. Ask your health care provider what activities are safe for you.  Do not  lift anything that is heavier than 10 lb (4.5 kg), or the limit that you are told, until your health care provider says that it is safe. General instructions  Take over-the-counter and prescription medicines only as told by your health care provider.  Do not take baths, swim, or use a hot tub until your health care provider approves. Ask your health care provider if you may take showers. You may only be allowed to take sponge baths.  Do not drive for 24 hours if you were given a sedative during your procedure.  Wear a medical alert bracelet in case of an emergency. This will tell any health care providers that you have a port.  Keep all follow-up visits as told by your health care provider. This is important. Contact a health care provider if:  You cannot flush your port with saline as directed, or you cannot draw blood from the port.  You have a fever or chills.  You have redness, swelling, or pain around your port insertion site.  You have fluid or blood coming from your port insertion site.  Your port insertion site feels warm to the touch.  You have pus or a bad smell coming from the port insertion site. Get help right away if:  You have chest pain or shortness of breath.  You have bleeding from your port that you cannot control. Summary  Take care of the port as told by your   health care provider. Keep the manufacturer's information card with you at all times.  Change your dressing as told by your health care provider.  Contact a health care provider if you have a fever or chills or if you have redness, swelling, or pain around your port insertion site.  Keep all follow-up visits as told by your health care provider. This information is not intended to replace advice given to you by your health care provider. Make sure you discuss any questions you have with your health care provider. Document Revised: 08/23/2017 Document Reviewed: 08/23/2017 Elsevier Patient Education   2021 Elsevier Inc.  

## 2020-05-20 ENCOUNTER — Encounter: Payer: Self-pay | Admitting: Hematology and Oncology

## 2020-05-28 ENCOUNTER — Inpatient Hospital Stay: Payer: Medicare Other

## 2020-05-28 ENCOUNTER — Other Ambulatory Visit: Payer: Self-pay

## 2020-05-28 ENCOUNTER — Encounter: Payer: Self-pay | Admitting: Hematology and Oncology

## 2020-05-28 ENCOUNTER — Inpatient Hospital Stay (HOSPITAL_BASED_OUTPATIENT_CLINIC_OR_DEPARTMENT_OTHER): Payer: Medicare Other | Admitting: Hematology and Oncology

## 2020-05-28 VITALS — BP 138/66 | HR 83 | Temp 97.8°F | Resp 17 | Ht 63.0 in | Wt 167.9 lb

## 2020-05-28 DIAGNOSIS — Z95828 Presence of other vascular implants and grafts: Secondary | ICD-10-CM

## 2020-05-28 DIAGNOSIS — C20 Malignant neoplasm of rectum: Secondary | ICD-10-CM

## 2020-05-28 DIAGNOSIS — D701 Agranulocytosis secondary to cancer chemotherapy: Secondary | ICD-10-CM | POA: Diagnosis not present

## 2020-05-28 DIAGNOSIS — L271 Localized skin eruption due to drugs and medicaments taken internally: Secondary | ICD-10-CM

## 2020-05-28 DIAGNOSIS — Z5111 Encounter for antineoplastic chemotherapy: Secondary | ICD-10-CM | POA: Diagnosis not present

## 2020-05-28 DIAGNOSIS — D696 Thrombocytopenia, unspecified: Secondary | ICD-10-CM

## 2020-05-28 DIAGNOSIS — T451X5A Adverse effect of antineoplastic and immunosuppressive drugs, initial encounter: Secondary | ICD-10-CM

## 2020-05-28 LAB — CMP (CANCER CENTER ONLY)
ALT: 19 U/L (ref 0–44)
AST: 31 U/L (ref 15–41)
Albumin: 3.6 g/dL (ref 3.5–5.0)
Alkaline Phosphatase: 116 U/L (ref 38–126)
Anion gap: 10 (ref 5–15)
BUN: 7 mg/dL — ABNORMAL LOW (ref 8–23)
CO2: 24 mmol/L (ref 22–32)
Calcium: 9 mg/dL (ref 8.9–10.3)
Chloride: 110 mmol/L (ref 98–111)
Creatinine: 0.8 mg/dL (ref 0.44–1.00)
GFR, Estimated: >60 mL/min (ref >60)
Glucose, Bld: 112 mg/dL — ABNORMAL HIGH (ref 70–99)
Potassium: 3.3 mmol/L — ABNORMAL LOW (ref 3.5–5.1)
Sodium: 144 mmol/L (ref 135–145)
Total Bilirubin: 0.6 mg/dL (ref 0.3–1.2)
Total Protein: 6.9 g/dL (ref 6.5–8.1)

## 2020-05-28 LAB — MAGNESIUM: Magnesium: 2 mg/dL (ref 1.7–2.4)

## 2020-05-28 LAB — CBC WITH DIFFERENTIAL (CANCER CENTER ONLY)
Abs Immature Granulocytes: 0.01 10*3/uL (ref 0.00–0.07)
Basophils Absolute: 0.1 10*3/uL (ref 0.0–0.1)
Basophils Relative: 2 %
Eosinophils Absolute: 0.2 10*3/uL (ref 0.0–0.5)
Eosinophils Relative: 4 %
HCT: 32.4 % — ABNORMAL LOW (ref 36.0–46.0)
Hemoglobin: 10.6 g/dL — ABNORMAL LOW (ref 12.0–15.0)
Immature Granulocytes: 0 %
Lymphocytes Relative: 36 %
Lymphs Abs: 1.6 10*3/uL (ref 0.7–4.0)
MCH: 30.5 pg (ref 26.0–34.0)
MCHC: 32.7 g/dL (ref 30.0–36.0)
MCV: 93.4 fL (ref 80.0–100.0)
Monocytes Absolute: 0.7 10*3/uL (ref 0.1–1.0)
Monocytes Relative: 16 %
Neutro Abs: 2 10*3/uL (ref 1.7–7.7)
Neutrophils Relative %: 42 %
Platelet Count: 97 10*3/uL — ABNORMAL LOW (ref 150–400)
RBC: 3.47 MIL/uL — ABNORMAL LOW (ref 3.87–5.11)
RDW: 15.9 % — ABNORMAL HIGH (ref 11.5–15.5)
WBC Count: 4.6 10*3/uL (ref 4.0–10.5)
nRBC: 0 % (ref 0.0–0.2)

## 2020-05-28 LAB — LACTATE DEHYDROGENASE: LDH: 209 U/L — ABNORMAL HIGH (ref 98–192)

## 2020-05-28 MED ORDER — SODIUM CHLORIDE 0.9% FLUSH
10.0000 mL | Freq: Once | INTRAVENOUS | Status: AC
  Administered 2020-05-28: 10 mL
  Filled 2020-05-28: qty 10

## 2020-05-28 MED ORDER — SODIUM CHLORIDE 0.9% FLUSH
10.0000 mL | INTRAVENOUS | Status: DC | PRN
  Filled 2020-05-28: qty 10

## 2020-05-28 MED ORDER — SODIUM CHLORIDE 0.9 % IV SOLN
1800.0000 mg/m2 | INTRAVENOUS | Status: DC
  Administered 2020-05-28: 3250 mg via INTRAVENOUS
  Filled 2020-05-28: qty 65

## 2020-05-28 MED ORDER — DEXTROSE 5 % IV SOLN
Freq: Once | INTRAVENOUS | Status: AC
  Filled 2020-05-28: qty 250

## 2020-05-28 MED ORDER — OXALIPLATIN CHEMO INJECTION 100 MG/20ML
84.0000 mg/m2 | Freq: Once | INTRAVENOUS | Status: AC
  Administered 2020-05-28: 150 mg via INTRAVENOUS
  Filled 2020-05-28: qty 10

## 2020-05-28 MED ORDER — PALONOSETRON HCL INJECTION 0.25 MG/5ML
0.2500 mg | Freq: Once | INTRAVENOUS | Status: AC
Start: 2020-05-28 — End: 2020-05-28
  Administered 2020-05-28: 0.25 mg via INTRAVENOUS

## 2020-05-28 MED ORDER — PALONOSETRON HCL INJECTION 0.25 MG/5ML
INTRAVENOUS | Status: AC
  Filled 2020-05-28: qty 5

## 2020-05-28 MED ORDER — LEUCOVORIN CALCIUM INJECTION 350 MG
300.0000 mg/m2 | Freq: Once | INTRAVENOUS | Status: AC
  Administered 2020-05-28: 540 mg via INTRAVENOUS
  Filled 2020-05-28: qty 27

## 2020-05-28 MED ORDER — SODIUM CHLORIDE 0.9 % IV SOLN
10.0000 mg | Freq: Once | INTRAVENOUS | Status: AC
  Administered 2020-05-28: 10 mg via INTRAVENOUS
  Filled 2020-05-28: qty 10

## 2020-05-28 MED ORDER — FLUOROURACIL CHEMO INJECTION 2.5 GM/50ML
300.0000 mg/m2 | Freq: Once | INTRAVENOUS | Status: AC
  Administered 2020-05-28: 550 mg via INTRAVENOUS
  Filled 2020-05-28: qty 11

## 2020-05-28 MED ORDER — HEPARIN SOD (PORK) LOCK FLUSH 100 UNIT/ML IV SOLN
500.0000 [IU] | Freq: Once | INTRAVENOUS | Status: DC | PRN
  Filled 2020-05-28: qty 5

## 2020-05-28 NOTE — Progress Notes (Signed)
OK to treat with platelet count of 97K per Dr. [name]

## 2020-05-28 NOTE — Progress Notes (Signed)
[place] [phone]   Fax:(336) [phone]  PROGRESS NOTE  Patient Care Team: Patient, No Pcp Per (Inactive) as PCP - General (General Practice) [name], RN as Oncology Nurse Navigator [name], MD as Consulting Physician (Hematology and Oncology)  Hematological/Oncological History # Rectal Adenocarcainoma, Stage T3N1M0 1) 01/18/2020: colonoscopy due to bright red blood per rectum. A mass was found in the rectum 8 cm from the anal verge. Biopsy confirms moderately differentiated adenocarcinoma.  2) 01/29/2020: CT chest showed no evidence of metastatic disease. MRI showed a T2-T3 malignancy that appears to involve the internal sphincter and 2 mesorectal nodes 3) 02/07/2020: seen at [facility] by Oncologist Dr. Georgiann Cocker 4) 02/11/2020: seen at [facility] by Dr. [name] 5) 02/21/2020: establish care with Dr. 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 female with medical history significant for rectal cancer who presents for a follow up visit. The patient's last visit was on 05/14/2020. In the interim since the last visit Cheyenne Scott has continued to struggle with HFS.   On exam today Cheyenne Scott reports the skin issues on her hands is improved, though still present.  She has blisters on her hands with some sensitivity at the fingertips.  She is also developing some dryness of the skin on the plantar surface of her feet, however she has been hiking over the weekend  and thinks that may have exacerbated findings.  Her weight is currently up to 167 pounds which is up 5 pounds from 04/30/2020.  Fortunately she has not been having issues with nausea, vomiting, or diarrhea.  She is not having any further blood in the stool and has not had any since around cycle 2.  She notes that she is having fatigue but is otherwise willing and able to proceed with treatment today.  She denies any fevers, chills, sweats, vomiting or diarrhea.  Full 10 point ROS is listed below.   MEDICAL HISTORY:  Past Medical History:  Diagnosis Date  . Acid reflux   . Allergy   . Rectal cancer [facility])     SURGICAL HISTORY: Past Surgical History:  Procedure Laterality Date  . ANTERIOR AND POSTERIOR REPAIR N/A 01/20/2015   Procedure: ANTERIOR (CYSTOCELE) AND POSTERIOR REPAIR (RECTOCELE) WITH PERINEORRHAPHY AND UTEROSACRAL LIGAMENT  SUSPENSION ;  Surgeon: Brien Few, MD;  Location: [place] [facility];  Service: Gynecology;  Laterality: N/A;  . IR IMAGING GUIDED PORT INSERTION  02/26/2020  . ROBOTIC ASSISTED TOTAL HYSTERECTOMY WITH SALPINGECTOMY Bilateral 01/20/2015   Procedure: ROBOTIC ASSISTED TOTAL HYSTERECTOMY WITH BILATERAL SALPINGECTOMY;  Surgeon: Brien Few, MD;  Location: [facility];  Service: Gynecology;  Laterality: Bilateral;  . TUBAL LIGATION      SOCIAL HISTORY: Social History   Socioeconomic History  . Marital status: Widowed    Spouse name: Not on file  . Number of children: Not on file  . Years of education: Not on file  . Highest education level: Not on file  Occupational History  .  Not on file  Tobacco Use  . Smoking status: Never Smoker  . Smokeless tobacco: Never Used  Substance and Sexual Activity  . Alcohol use: No    Alcohol/week: 0.0 standard drinks  . Drug use: No  . Sexual activity: Not on file  Other Topics Concern  . Not on file  Social History Narrative  . Not on file   Social Determinants of Health   Financial Resource Strain: Not on file  Food  Insecurity: Not on file  Transportation Needs: Not on file  Physical Activity: Not on file  Stress: Not on file  Social Connections: Not on file  Intimate Partner Violence: Not on file    FAMILY HISTORY: Family History  Problem Relation Age of Onset  . Diabetes Mother   . Heart disease Mother   . Cancer Brother   . Stroke Maternal Grandmother     ALLERGIES:  is allergic to penicillins.  MEDICATIONS:  Current Outpatient Medications  Medication Sig Dispense Refill  . acetaminophen (TYLENOL) 325 MG tablet Take 650 mg by mouth every 6 (six) hours as needed.    [name] ascorbic acid (VITAMIN C) 250 MG tablet Take by mouth.    . Cholecalciferol (VITAMIN D3) 25 MCG (1000 UT) CAPS Take by mouth.    . estradiol (ESTRACE) 0.1 MG/GM vaginal cream Place vaginally.    [name] L-Lysine 500 MG TABS Take by mouth.    . lidocaine-prilocaine (EMLA) cream Apply 1 application topically as needed. 30 g 0  . Multiple Vitamin (MULTIVITAMIN WITH MINERALS) TABS tablet Take 1 tablet by mouth daily.    [name] omeprazole (PRILOSEC OTC) 20 MG tablet Take by mouth.    . ondansetron (ZOFRAN) 8 MG tablet Take 1 tablet (8 mg total) by mouth every 8 (eight) hours as needed for nausea or vomiting. 30 tablet 0  . potassium chloride SA (KLOR-CON) 20 MEQ tablet Take 2 tablets (40 mEq total) by mouth daily. 28 tablet 0  . prochlorperazine (COMPAZINE) 10 MG tablet Take 1 tablet (10 mg total) by mouth every 6 (six) hours as needed for nausea or vomiting. 30 tablet 0  . zinc gluconate 50 MG tablet Take by mouth.     No current facility-administered medications for this visit.    REVIEW OF SYSTEMS:   Constitutional: ( - ) fevers, ( - )  chills , ( - ) night sweats Eyes: ( - ) blurriness of vision, ( - ) double vision, ( - ) watery eyes Ears, nose, mouth, throat, and face: ( - ) mucositis, ( - ) sore throat Respiratory: ( - ) cough, ( - ) dyspnea, ( - ) wheezes Cardiovascular: ( - ) palpitation, ( - ) chest discomfort, ( - ) lower  extremity swelling Gastrointestinal:  ( - ) nausea, ( - ) heartburn, ( - ) change in bowel habits Skin: ( - ) abnormal skin rashes Lymphatics: ( - ) new lymphadenopathy, ( - ) easy bruising Neurological: ( - ) numbness, ( - ) tingling, ( - ) new weaknesses Behavioral/Psych: ( - ) mood change, ( - ) new changes  All other systems were reviewed with the patient and are negative.  PHYSICAL EXAMINATION: ECOG PERFORMANCE STATUS: 1 - Symptomatic but completely ambulatory  Vitals:   05/28/20 1004  BP: 138/66  Pulse: 83  Resp: 17  Temp: 97.8 F (36.6 C)  SpO2: 100%   Filed Weights   05/28/20 1004  Weight: 167 lb 14.4 oz (76.2 kg)    GENERAL: well  appearing middle aged Caucasian female alert, no distress and comfortable SKIN: erythema and dry, peeling skin noted on hands bilaterally. No palmar involvement.  EYES: conjunctiva are pink and non-injected, sclera clear LUNGS: clear to auscultation and percussion with normal breathing effort HEART: regular rate & rhythm and no murmurs and no lower extremity edema Musculoskeletal: no cyanosis of digits and no clubbing  PSYCH: alert & oriented x 3, fluent speech NEURO: no focal motor/sensory deficits  LABORATORY DATA:  I have reviewed the data as listed CBC Latest Ref Rng & Units 05/28/2020 05/14/2020 04/30/2020  WBC 4.0 - 10.5 K/uL 4.6 7.1 2.9(L)  Hemoglobin 12.0 - 15.0 g/dL 10.6(L) 12.2 10.9(L)  Hematocrit 36.0 - 46.0 % 32.4(L) 37.5 32.7(L)  Platelets 150 - 400 K/uL 97(L) 217 86(L)    CMP Latest Ref Rng & Units 05/14/2020 04/30/2020 04/16/2020  Glucose 70 - 99 mg/dL 93 127(H) 112(H)  BUN 8 - 23 mg/dL 9 7(L) 9  Creatinine 0.44 - 1.00 mg/dL 0.91 0.84 0.88  Sodium 135 - 145 mmol/L 143 144 143  Potassium 3.5 - 5.1 mmol/L 3.7 3.1(L) 3.1(L)  Chloride 98 - 111 mmol/L 106 110 108  CO2 22 - 32 mmol/L 24 24 24   Calcium 8.9 - 10.3 mg/dL 9.2 9.0 9.1  Total Protein 6.5 - 8.1 g/dL 7.6 6.8 7.1  Total Bilirubin 0.3 - 1.2 mg/dL 0.7 0.7 0.6  Alkaline  Phos 38 - 126 U/L 106 86 89  AST 15 - 41 U/L 48(H) 34 34  ALT 0 - 44 U/L 36 26 28     RADIOGRAPHIC STUDIES: No results found.  ASSESSMENT & PLAN Cheyenne Scott 65 y.o. female with medical history significant for rectal cancer who presents for a follow up visit.  The current plan to treat the patient with FOLFOX chemotherapy every 2 weeks x8 cycles, for total of 16 weeks for the treatment.  Then we will start chemoradiation with capecitabine 825 mg per metered squared twice daily on days of radiation, typically for about 28 fractions.  Mrs. Folse established with Dr. Lisbeth Renshaw who will be performing the radiation component of her treatment.  Once this is complete we will reevaluate with repeat imaging in proceed to surgery if feasible.  The patient and her daughter-in-law voiced their understanding of this plan moving forward.  Previously we discussed the FOLFOX chemotherapy treatment and the expected side effects including nausea, vomiting, diarrhea, neutropenia, nerve pain, and hand-foot syndrome.  Discussed that these are the most common and dangerous side effects, but is not limited to these alone.  The patient was agreeable to proceeding with FOLFOX chemotherapy at this time.  We will plan for chemotherapy education as well as port placement as soon as possible.  # Rectal Adenocarcainoma, Stage T3N1M0 -- Findings consistent with a stage III T3 N1 M0 rectal adenocarcinoma.  Treatment of choice would be total neoadjuvant chemotherapy/ chemoradiation followed by surgical resection. -- Started Cycle 1 Day 1 of FOLFOX chemotherapy on 03/05/2020.  Today is Cycle 6 Day 1. Plan for 8 cycles total.  -- initial pretreatment CEA found to be within normal limits, not likely to be of value moving forward.  --dose reduction of 25% for the Fluorouracil started on 05/14/2020 due to HFS.  --monitor CBC, CMP, and magnesium with each cycle. --RTC in 2 weeks for Cycle 7 of FOLFOX.   #Hand Foot Syndrome, Grade  II --developed after Cycle 3 --encourage use of moisturizer -continue to monitor   #Supportive Care --chemotherapy education completed --port in place --  zofran 8mg  q8H PRN and compazine 10mg  PO q6H for nausea -- EMLA cream for port -- no pain medication required at this time.   No orders of the defined types were placed in this encounter.  All questions were answered. The patient knows to call the clinic with any problems, questions or concerns.  A total of more than 30 minutes were spent on this encounter and over half of that time was spent on counseling and coordination of care as outlined above.   Ledell Peoples, MD Department of Hematology/Oncology [place] at [facility] Phone: [phone] Pager: [phone] Email: [email or web address]  05/28/2020 10:30 AM

## 2020-05-30 ENCOUNTER — Inpatient Hospital Stay: Payer: Medicare Other

## 2020-05-30 ENCOUNTER — Other Ambulatory Visit: Payer: Self-pay

## 2020-05-30 VITALS — BP 137/76 | HR 81 | Temp 98.8°F | Resp 16

## 2020-05-30 DIAGNOSIS — C20 Malignant neoplasm of rectum: Secondary | ICD-10-CM

## 2020-05-30 DIAGNOSIS — Z5111 Encounter for antineoplastic chemotherapy: Secondary | ICD-10-CM | POA: Diagnosis not present

## 2020-05-30 MED ORDER — HEPARIN SOD (PORK) LOCK FLUSH 100 UNIT/ML IV SOLN
500.0000 [IU] | Freq: Once | INTRAVENOUS | Status: AC | PRN
  Administered 2020-05-30: 500 [IU]
  Filled 2020-05-30: qty 5

## 2020-05-30 MED ORDER — SODIUM CHLORIDE 0.9% FLUSH
10.0000 mL | INTRAVENOUS | Status: DC | PRN
  Administered 2020-05-30: 10 mL
  Filled 2020-05-30: qty 10

## 2020-05-30 NOTE — Patient Instructions (Signed)
Implanted Port Insertion, Care After This sheet gives you information about how to care for yourself after your procedure. Your health care provider may also give you more specific instructions. If you have problems or questions, contact your health care provider. What can I expect after the procedure? After the procedure, it is common to have:  Discomfort at the port insertion site.  Bruising on the skin over the port. This should improve over 3-4 days. Follow these instructions at home: Port care  After your port is placed, you will get a manufacturer's information card. The card has information about your port. Keep this card with you at all times.  Take care of the port as told by your health care provider. Ask your health care provider if you or a family member can get training for taking care of the port at home. A home health care nurse may also take care of the port.  Make sure to remember what type of port you have. Incision care  Follow instructions from your health care provider about how to take care of your port insertion site. Make sure you: ? Wash your hands with soap and water before and after you change your bandage (dressing). If soap and water are not available, use hand sanitizer. ? Change your dressing as told by your health care provider. ? Leave stitches (sutures), skin glue, or adhesive strips in place. These skin closures may need to stay in place for 2 weeks or longer. If adhesive strip edges start to loosen and curl up, you may trim the loose edges. Do not remove adhesive strips completely unless your health care provider tells you to do that.  Check your port insertion site every day for signs of infection. Check for: ? Redness, swelling, or pain. ? Fluid or blood. ? Warmth. ? Pus or a bad smell.      Activity  Return to your normal activities as told by your health care provider. Ask your health care provider what activities are safe for you.  Do not  lift anything that is heavier than 10 lb (4.5 kg), or the limit that you are told, until your health care provider says that it is safe. General instructions  Take over-the-counter and prescription medicines only as told by your health care provider.  Do not take baths, swim, or use a hot tub until your health care provider approves. Ask your health care provider if you may take showers. You may only be allowed to take sponge baths.  Do not drive for 24 hours if you were given a sedative during your procedure.  Wear a medical alert bracelet in case of an emergency. This will tell any health care providers that you have a port.  Keep all follow-up visits as told by your health care provider. This is important. Contact a health care provider if:  You cannot flush your port with saline as directed, or you cannot draw blood from the port.  You have a fever or chills.  You have redness, swelling, or pain around your port insertion site.  You have fluid or blood coming from your port insertion site.  Your port insertion site feels warm to the touch.  You have pus or a bad smell coming from the port insertion site. Get help right away if:  You have chest pain or shortness of breath.  You have bleeding from your port that you cannot control. Summary  Take care of the port as told by your   health care provider. Keep the manufacturer's information card with you at all times.  Change your dressing as told by your health care provider.  Contact a health care provider if you have a fever or chills or if you have redness, swelling, or pain around your port insertion site.  Keep all follow-up visits as told by your health care provider. This information is not intended to replace advice given to you by your health care provider. Make sure you discuss any questions you have with your health care provider. Document Revised: 08/23/2017 Document Reviewed: 08/23/2017 Elsevier Patient Education   2021 Elsevier Inc.  

## 2020-06-12 ENCOUNTER — Other Ambulatory Visit: Payer: Medicare Other

## 2020-06-12 ENCOUNTER — Inpatient Hospital Stay: Payer: Medicare Other

## 2020-06-12 ENCOUNTER — Other Ambulatory Visit: Payer: Self-pay

## 2020-06-12 ENCOUNTER — Inpatient Hospital Stay: Payer: Medicare Other | Attending: Hematology and Oncology | Admitting: Hematology and Oncology

## 2020-06-12 VITALS — BP 127/72 | HR 81 | Temp 97.3°F | Resp 18 | Ht 63.0 in | Wt 166.5 lb

## 2020-06-12 DIAGNOSIS — Z95828 Presence of other vascular implants and grafts: Secondary | ICD-10-CM

## 2020-06-12 DIAGNOSIS — D6959 Other secondary thrombocytopenia: Secondary | ICD-10-CM | POA: Insufficient documentation

## 2020-06-12 DIAGNOSIS — L271 Localized skin eruption due to drugs and medicaments taken internally: Secondary | ICD-10-CM | POA: Diagnosis not present

## 2020-06-12 DIAGNOSIS — C20 Malignant neoplasm of rectum: Secondary | ICD-10-CM

## 2020-06-12 DIAGNOSIS — D696 Thrombocytopenia, unspecified: Secondary | ICD-10-CM | POA: Diagnosis not present

## 2020-06-12 DIAGNOSIS — E876 Hypokalemia: Secondary | ICD-10-CM | POA: Insufficient documentation

## 2020-06-12 DIAGNOSIS — D701 Agranulocytosis secondary to cancer chemotherapy: Secondary | ICD-10-CM

## 2020-06-12 DIAGNOSIS — Z5111 Encounter for antineoplastic chemotherapy: Secondary | ICD-10-CM | POA: Diagnosis present

## 2020-06-12 DIAGNOSIS — T451X5A Adverse effect of antineoplastic and immunosuppressive drugs, initial encounter: Secondary | ICD-10-CM | POA: Diagnosis not present

## 2020-06-12 DIAGNOSIS — R5383 Other fatigue: Secondary | ICD-10-CM | POA: Diagnosis not present

## 2020-06-12 LAB — CBC WITH DIFFERENTIAL (CANCER CENTER ONLY)
Abs Immature Granulocytes: 0.01 10*3/uL (ref 0.00–0.07)
Basophils Absolute: 0.1 10*3/uL (ref 0.0–0.1)
Basophils Relative: 3 %
Eosinophils Absolute: 0.1 10*3/uL (ref 0.0–0.5)
Eosinophils Relative: 3 %
HCT: 35 % — ABNORMAL LOW (ref 36.0–46.0)
Hemoglobin: 11.5 g/dL — ABNORMAL LOW (ref 12.0–15.0)
Immature Granulocytes: 0 %
Lymphocytes Relative: 45 %
Lymphs Abs: 1.4 10*3/uL (ref 0.7–4.0)
MCH: 31 pg (ref 26.0–34.0)
MCHC: 32.9 g/dL (ref 30.0–36.0)
MCV: 94.3 fL (ref 80.0–100.0)
Monocytes Absolute: 0.7 10*3/uL (ref 0.1–1.0)
Monocytes Relative: 22 %
Neutro Abs: 0.9 10*3/uL — ABNORMAL LOW (ref 1.7–7.7)
Neutrophils Relative %: 27 %
Platelet Count: 108 10*3/uL — ABNORMAL LOW (ref 150–400)
RBC: 3.71 MIL/uL — ABNORMAL LOW (ref 3.87–5.11)
RDW: 15.4 % (ref 11.5–15.5)
WBC Count: 3.1 10*3/uL — ABNORMAL LOW (ref 4.0–10.5)
nRBC: 0 % (ref 0.0–0.2)

## 2020-06-12 LAB — CMP (CANCER CENTER ONLY)
ALT: 17 U/L (ref 0–44)
AST: 33 U/L (ref 15–41)
Albumin: 3.6 g/dL (ref 3.5–5.0)
Alkaline Phosphatase: 114 U/L (ref 38–126)
Anion gap: 9 (ref 5–15)
BUN: 10 mg/dL (ref 8–23)
CO2: 25 mmol/L (ref 22–32)
Calcium: 9.4 mg/dL (ref 8.9–10.3)
Chloride: 107 mmol/L (ref 98–111)
Creatinine: 0.82 mg/dL (ref 0.44–1.00)
GFR, Estimated: >60 mL/min (ref >60)
Glucose, Bld: 114 mg/dL — ABNORMAL HIGH (ref 70–99)
Potassium: 3.6 mmol/L (ref 3.5–5.1)
Sodium: 141 mmol/L (ref 135–145)
Total Bilirubin: 1 mg/dL (ref 0.3–1.2)
Total Protein: 7.3 g/dL (ref 6.5–8.1)

## 2020-06-12 LAB — MAGNESIUM: Magnesium: 2.1 mg/dL (ref 1.7–2.4)

## 2020-06-12 LAB — LACTATE DEHYDROGENASE: LDH: 207 U/L — ABNORMAL HIGH (ref 98–192)

## 2020-06-12 MED ORDER — SODIUM CHLORIDE 0.9% FLUSH
10.0000 mL | Freq: Once | INTRAVENOUS | Status: AC
  Administered 2020-06-12: 10 mL
  Filled 2020-06-12: qty 10

## 2020-06-12 MED ORDER — DEXAMETHASONE SODIUM PHOSPHATE 100 MG/10ML IJ SOLN
10.0000 mg | Freq: Once | INTRAMUSCULAR | Status: AC
  Administered 2020-06-12: 10 mg via INTRAVENOUS
  Filled 2020-06-12: qty 10

## 2020-06-12 MED ORDER — LEUCOVORIN CALCIUM INJECTION 350 MG
300.0000 mg/m2 | Freq: Once | INTRAVENOUS | Status: AC
  Administered 2020-06-12: 540 mg via INTRAVENOUS
  Filled 2020-06-12: qty 27

## 2020-06-12 MED ORDER — SODIUM CHLORIDE 0.9 % IV SOLN
1800.0000 mg/m2 | INTRAVENOUS | Status: DC
  Administered 2020-06-12: 3250 mg via INTRAVENOUS
  Filled 2020-06-12: qty 65

## 2020-06-12 MED ORDER — FLUOROURACIL CHEMO INJECTION 2.5 GM/50ML
300.0000 mg/m2 | Freq: Once | INTRAVENOUS | Status: AC
  Administered 2020-06-12: 550 mg via INTRAVENOUS
  Filled 2020-06-12: qty 11

## 2020-06-12 MED ORDER — PALONOSETRON HCL INJECTION 0.25 MG/5ML
INTRAVENOUS | Status: AC
  Filled 2020-06-12: qty 5

## 2020-06-12 MED ORDER — DEXTROSE 5 % IV SOLN
Freq: Once | INTRAVENOUS | Status: AC
  Filled 2020-06-12: qty 250

## 2020-06-12 MED ORDER — OXALIPLATIN CHEMO INJECTION 100 MG/20ML
84.0000 mg/m2 | Freq: Once | INTRAVENOUS | Status: AC
  Administered 2020-06-12: 150 mg via INTRAVENOUS
  Filled 2020-06-12: qty 20

## 2020-06-12 MED ORDER — SODIUM CHLORIDE 0.9% FLUSH
10.0000 mL | INTRAVENOUS | Status: DC | PRN
  Filled 2020-06-12: qty 10

## 2020-06-12 MED ORDER — PALONOSETRON HCL INJECTION 0.25 MG/5ML
0.2500 mg | Freq: Once | INTRAVENOUS | Status: AC
Start: 2020-06-12 — End: 2020-06-12
  Administered 2020-06-12: 0.25 mg via INTRAVENOUS

## 2020-06-12 MED ORDER — HEPARIN SOD (PORK) LOCK FLUSH 100 UNIT/ML IV SOLN
500.0000 [IU] | Freq: Once | INTRAVENOUS | Status: DC | PRN
  Filled 2020-06-12: qty 5

## 2020-06-12 NOTE — Progress Notes (Signed)
Okay to treat with [place] [facility] per Dr. [name].

## 2020-06-12 NOTE — Progress Notes (Signed)
[place] [phone]   [phone]  PROGRESS NOTE  Patient Care Team: Patient, No Pcp Per (Inactive) as PCP - General (General Practice) [name], RN as Oncology Nurse Navigator [name], MD as Consulting Physician (Hematology and Oncology)  Hematological/Oncological History # Rectal Adenocarcainoma, Stage T3N1M0 1) 01/18/2020: colonoscopy due to bright red blood per rectum. A mass was found in the rectum 8 cm from the anal verge. Biopsy confirms moderately differentiated adenocarcinoma.  2) 01/29/2020: CT chest showed no evidence of metastatic disease. MRI showed a T2-T3 malignancy that appears to involve the internal sphincter and 2 mesorectal nodes 3) 02/07/2020: seen at [facility] by Oncologist Dr. Georgiann Cocker 4) 02/11/2020: seen at [facility] by Dr. [name] 5) 02/21/2020: establish care with Dr. 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 female with medical history significant for rectal cancer who presents for a follow up visit. The patient's last visit was on 05/28/2020. 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   On exam today Cheyenne Scott reports he has been well overall in the interim since our last visit.  She does endorse that her work symptom is fatigue, but otherwise has not having any issues with vomiting  or diarrhea.  She does occasionally have some queasiness which passes quickly.  She reports this happens most often while she is on empty stomach.  Her appetite has been good and her weight is been quite stable though she does miss eating cold things.  She unfortunately has had some new blistering on the tips of her fingers but reports that recently after a soccer practice with her grandchild she went to the car and grabbed the steering wheel which was quite hot.  She notes that she is having fatigue but is otherwise willing and able to proceed with treatment today.  She denies any fevers, chills, sweats, vomiting or diarrhea.  Full 10 point ROS is listed below.   MEDICAL HISTORY:  Past Medical History:  Diagnosis Date  . Acid reflux   . Allergy   . Rectal cancer [facility])     SURGICAL HISTORY: Past Surgical History:  Procedure Laterality Date  . ANTERIOR AND POSTERIOR REPAIR N/A 01/20/2015   Procedure: ANTERIOR (CYSTOCELE) AND POSTERIOR REPAIR (RECTOCELE) WITH PERINEORRHAPHY AND UTEROSACRAL LIGAMENT  SUSPENSION ;  Surgeon: Brien Few, MD;  Location: [facility];  Service: Gynecology;  Laterality: N/A;  . IR IMAGING GUIDED PORT INSERTION  02/26/2020  . ROBOTIC ASSISTED TOTAL HYSTERECTOMY WITH SALPINGECTOMY Bilateral 01/20/2015   Procedure: ROBOTIC ASSISTED TOTAL HYSTERECTOMY WITH BILATERAL SALPINGECTOMY;  Surgeon: Brien Few, MD;  Location: [facility];  Service: Gynecology;  Laterality: Bilateral;  . TUBAL LIGATION      SOCIAL HISTORY: Social History   Socioeconomic History  . Marital status: Widowed    Spouse name: Not on file  .  Number of children: Not on file  . Years of education: Not on file  . Highest education level: Not on file  Occupational History  . Not on file  Tobacco Use  . Smoking status: Never Smoker  . Smokeless tobacco: Never Used  Substance and Sexual Activity  . Alcohol use: No    Alcohol/week: 0.0 standard drinks  . Drug use: No  . Sexual activity: Not on file   Other Topics Concern  . Not on file  Social History Narrative  . Not on file   Social Determinants of Health   Financial Resource Strain: Not on file  Food Insecurity: Not on file  Transportation Needs: Not on file  Physical Activity: Not on file  Stress: Not on file  Social Connections: Not on file  Intimate Partner Violence: Not on file    FAMILY HISTORY: Family History  Problem Relation Age of Onset  . Diabetes Mother   . Heart disease Mother   . Cancer Brother   . Stroke Maternal Grandmother     ALLERGIES:  is allergic to penicillins.  MEDICATIONS:  Current Outpatient Medications  Medication Sig Dispense Refill  . acetaminophen (TYLENOL) 325 MG tablet Take 650 mg by mouth every 6 (six) hours as needed.    [name] ascorbic acid (VITAMIN C) 250 MG tablet Take by mouth.    . Cholecalciferol (VITAMIN D3) 25 MCG (1000 UT) CAPS Take by mouth.    . estradiol (ESTRACE) 0.1 MG/GM vaginal cream Place vaginally.    [name] L-Lysine 500 MG TABS Take by mouth.    . lidocaine-prilocaine (EMLA) cream Apply 1 application topically as needed. 30 g 0  . Multiple Vitamin (MULTIVITAMIN WITH MINERALS) TABS tablet Take 1 tablet by mouth daily.    [name] omeprazole (PRILOSEC OTC) 20 MG tablet Take by mouth.    . ondansetron (ZOFRAN) 8 MG tablet Take 1 tablet (8 mg total) by mouth every 8 (eight) hours as needed for nausea or vomiting. 30 tablet 0  . potassium chloride SA (KLOR-CON) 20 MEQ tablet Take 2 tablets (40 mEq total) by mouth daily. 28 tablet 0  . prochlorperazine (COMPAZINE) 10 MG tablet Take 1 tablet (10 mg total) by mouth every 6 (six) hours as needed for nausea or vomiting. 30 tablet 0  . zinc gluconate 50 MG tablet Take by mouth.     No current facility-administered medications for this visit.    REVIEW OF SYSTEMS:   Constitutional: ( - ) fevers, ( - )  chills , ( - ) night sweats Eyes: ( - ) blurriness of vision, ( - ) double vision, ( - ) watery eyes Ears, nose, mouth, throat, and face:  ( - ) mucositis, ( - ) sore throat Respiratory: ( - ) cough, ( - ) dyspnea, ( - ) wheezes Cardiovascular: ( - ) palpitation, ( - ) chest discomfort, ( - ) lower extremity swelling Gastrointestinal:  ( - ) nausea, ( - ) heartburn, ( - ) change in bowel habits Skin: ( - ) abnormal skin rashes Lymphatics: ( - ) new lymphadenopathy, ( - ) easy bruising Neurological: ( - ) numbness, ( - ) tingling, ( - ) new weaknesses Behavioral/Psych: ( - ) mood change, ( - ) new changes  All other systems were reviewed with the patient and are negative.  PHYSICAL EXAMINATION: ECOG PERFORMANCE STATUS: 1 - Symptomatic but completely ambulatory  Vitals:   06/12/20 1013  BP: 127/72  Pulse: 81  Resp: 18  Temp: (!)  97.3 F (36.3 C)  SpO2: 100%   Filed Weights   06/12/20 1013  Weight: 166 lb 8 oz (75.5 kg)    GENERAL: well appearing middle aged Caucasian female alert, no distress and comfortable SKIN: erythema and dry, peeling skin noted on hands bilaterally. No palmar involvement.  EYES: conjunctiva are pink and non-injected, sclera clear LUNGS: clear to auscultation and percussion with normal breathing effort HEART: regular rate & rhythm and no murmurs and no lower extremity edema Musculoskeletal: no cyanosis of digits and no clubbing  PSYCH: alert & oriented x 3, fluent speech NEURO: no focal motor/sensory deficits  LABORATORY DATA:  I have reviewed the data as listed CBC Latest Ref Rng & Units 06/12/2020 05/28/2020 05/14/2020  WBC 4.0 - 10.5 K/uL 3.1(L) 4.6 7.1  Hemoglobin 12.0 - 15.0 g/dL 11.5(L) 10.6(L) 12.2  Hematocrit 36.0 - 46.0 % 35.0(L) 32.4(L) 37.5  Platelets 150 - 400 K/uL 108(L) 97(L) 217    CMP Latest Ref Rng & Units 06/12/2020 05/28/2020 05/14/2020  Glucose 70 - 99 mg/dL 114(H) 112(H) 93  BUN 8 - 23 mg/dL 10 7(L) 9  Creatinine 0.44 - 1.00 mg/dL 0.82 0.80 0.91  Sodium 135 - 145 mmol/L 141 144 143  Potassium 3.5 - 5.1 mmol/L 3.6 3.3(L) 3.7  Chloride 98 - 111 mmol/L 107 110 106  CO2 22 -  32 mmol/L 25 24 24   Calcium 8.9 - 10.3 mg/dL 9.4 9.0 9.2  Total Protein 6.5 - 8.1 g/dL 7.3 6.9 7.6  Total Bilirubin 0.3 - 1.2 mg/dL 1.0 0.6 0.7  Alkaline Phos 38 - 126 U/L 114 116 106  AST 15 - 41 U/L 33 31 48(H)  ALT 0 - 44 U/L 17 19 36     RADIOGRAPHIC STUDIES: No results found.  ASSESSMENT & PLAN Cheyenne Scott 65 y.o. female with medical history significant for rectal cancer who presents for a follow up visit.  The current plan to treat the patient with FOLFOX chemotherapy every 2 weeks x 8 cycles, for total of 16 weeks for the treatment.  Then we will start chemoradiation with capecitabine 825 mg per metered squared twice daily on days of radiation, typically for about 28 fractions.  Cheyenne Scott established with Dr. Lisbeth Renshaw who will be performing the radiation component of her treatment.  Once this is complete we will reevaluate with repeat imaging in proceed to surgery if feasible.  The patient and her daughter-in-law voiced their understanding of this plan moving forward.  Previously we discussed the FOLFOX chemotherapy treatment and the expected side effects including nausea, vomiting, diarrhea, neutropenia, nerve pain, and hand-foot syndrome.  Discussed that these are the most common and dangerous side effects, but is not limited to these alone.  The patient was agreeable to proceeding with FOLFOX chemotherapy at this time.  We will plan for chemotherapy education as well as port placement as soon as possible.  # Rectal Adenocarcainoma, Stage T3N1M0 -- Findings consistent with a stage III T3 N1 M0 rectal adenocarcinoma.  Treatment of choice would be total neoadjuvant chemotherapy/ chemoradiation followed by surgical resection. -- Started Cycle 1 Day 1 of FOLFOX chemotherapy on 03/05/2020.  Today is Cycle 7 Day 1. Plan for 8 cycles total.  -- initial pretreatment CEA found to be within normal limits, not likely to be of value moving forward.  --dose reduction of 25% for the  Fluorouracil started on 05/14/2020 due to HFS.  --monitor CBC, CMP, and magnesium with each cycle. --RTC in 2 weeks for Cycle 8 of  FOLFOX (the final cycle).   #Hand Foot Syndrome, Grade II --developed after Cycle 3 --encourage use of moisturizer -continue to monitor   #Supportive Care --chemotherapy education completed --port in place --zofran 8mg  q8H PRN and compazine 10mg  PO q6H for nausea -- EMLA cream for port -- no pain medication required at this time.   No orders of the defined types were placed in this encounter.  All questions were answered. The patient knows to call the clinic with any problems, questions or concerns.  A total of more than 30 minutes were spent on this encounter and over half of that time was spent on counseling and coordination of care as outlined above.   Ledell Peoples, MD Department of Hematology/Oncology [place] at [facility] Phone: [phone] Pager: [phone] Email: [email or web address]  06/12/2020 10:47 AM

## 2020-06-12 NOTE — Patient Instructions (Signed)
[facility] [place] ONCOLOGY  Discharge Instructions: Thank you for choosing [facility] to provide your oncology and hematology care.   If you have a lab appointment with the [facility], please go directly to the [facility] and check in at the registration area.   Wear comfortable clothing and clothing appropriate for easy access to any Portacath or PICC line.   We strive to give you quality time with your provider. You may need to reschedule your appointment if you arrive late (15 or more minutes).  Arriving late affects you and other patients whose appointments are after yours.  Also, if you miss three or more appointments without notifying the office, you may be dismissed from the clinic at the provider's discretion.      For prescription refill requests, have your pharmacy contact our office and allow 72 hours for refills to be completed.    Today you received the following chemotherapy and/or immunotherapy agents: Oxaliplatin, leucovorin, 5FU push and pump.       To help prevent nausea and vomiting after your treatment, we encourage you to take your nausea medication as directed.  BELOW ARE SYMPTOMS THAT SHOULD BE REPORTED IMMEDIATELY: . *FEVER GREATER THAN 100.4 F (38 C) OR HIGHER . *CHILLS OR SWEATING . *NAUSEA AND VOMITING THAT IS NOT CONTROLLED WITH YOUR NAUSEA MEDICATION . *UNUSUAL SHORTNESS OF BREATH . *UNUSUAL BRUISING OR BLEEDING . *URINARY PROBLEMS (pain or burning when urinating, or frequent urination) . *BOWEL PROBLEMS (unusual diarrhea, constipation, pain near the anus) . TENDERNESS IN MOUTH AND THROAT WITH OR WITHOUT PRESENCE OF ULCERS (sore throat, sores in mouth, or a toothache) . UNUSUAL RASH, SWELLING OR PAIN  . UNUSUAL VAGINAL DISCHARGE OR ITCHING   Items with * indicate a potential emergency and should be followed up as soon as possible or go to the Emergency Department if any problems should occur.  Please show the CHEMOTHERAPY  ALERT CARD or IMMUNOTHERAPY ALERT CARD at check-in to the Emergency Department and triage nurse.  Should you have questions after your visit or need to cancel or reschedule your appointment, please contact [facility]  Dept: [phone]  and follow the prompts.  Office hours are 8:00 a.m. to 4:30 p.m. Monday - Friday. Please note that voicemails left after 4:00 p.m. may not be returned until the following business day.  We are closed weekends and major holidays. You have access to a nurse at all times for urgent questions. Please call the main number to the clinic Dept: [phone] and follow the prompts.   For any non-urgent questions, you may also contact your provider using MyChart. We now offer e-Visits for anyone 49 and older to request care online for non-urgent symptoms. For details visit [email or web address].   Also download the MyChart app! Go to the app store, search "MyChart", open the app, select [facility], and log in with your MyChart username and password.  Due to Covid, a mask is required upon entering the hospital/clinic. If you do not have a mask, one will be given to you upon arrival. For doctor visits, patients may have 1 support person aged 75 or older with them. For treatment visits, patients cannot have anyone with them due to current Covid guidelines and our immunocompromised population.

## 2020-06-14 ENCOUNTER — Other Ambulatory Visit: Payer: Self-pay

## 2020-06-14 ENCOUNTER — Inpatient Hospital Stay: Payer: Medicare Other

## 2020-06-14 VITALS — BP 143/82 | HR 89 | Temp 97.3°F | Resp 20

## 2020-06-14 DIAGNOSIS — C20 Malignant neoplasm of rectum: Secondary | ICD-10-CM

## 2020-06-14 DIAGNOSIS — Z5111 Encounter for antineoplastic chemotherapy: Secondary | ICD-10-CM | POA: Diagnosis not present

## 2020-06-14 MED ORDER — HEPARIN SOD (PORK) LOCK FLUSH 100 UNIT/ML IV SOLN
500.0000 [IU] | Freq: Once | INTRAVENOUS | Status: AC | PRN
  Administered 2020-06-14: 500 [IU]
  Filled 2020-06-14: qty 5

## 2020-06-14 MED ORDER — SODIUM CHLORIDE 0.9% FLUSH
10.0000 mL | INTRAVENOUS | Status: DC | PRN
  Administered 2020-06-14: 10 mL
  Filled 2020-06-14: qty 10

## 2020-06-14 NOTE — Patient Instructions (Signed)

## 2020-06-25 ENCOUNTER — Other Ambulatory Visit: Payer: Self-pay | Admitting: Hematology and Oncology

## 2020-06-25 ENCOUNTER — Inpatient Hospital Stay (HOSPITAL_BASED_OUTPATIENT_CLINIC_OR_DEPARTMENT_OTHER): Payer: Medicare Other | Admitting: Physician Assistant

## 2020-06-25 ENCOUNTER — Other Ambulatory Visit: Payer: Self-pay

## 2020-06-25 ENCOUNTER — Inpatient Hospital Stay: Payer: Medicare Other

## 2020-06-25 ENCOUNTER — Other Ambulatory Visit: Payer: Medicare Other

## 2020-06-25 VITALS — BP 130/68 | HR 76 | Temp 98.0°F | Resp 18 | Ht 63.0 in | Wt 166.4 lb

## 2020-06-25 DIAGNOSIS — C20 Malignant neoplasm of rectum: Secondary | ICD-10-CM

## 2020-06-25 DIAGNOSIS — E876 Hypokalemia: Secondary | ICD-10-CM | POA: Diagnosis not present

## 2020-06-25 DIAGNOSIS — Z5111 Encounter for antineoplastic chemotherapy: Secondary | ICD-10-CM

## 2020-06-25 DIAGNOSIS — Z95828 Presence of other vascular implants and grafts: Secondary | ICD-10-CM

## 2020-06-25 LAB — CMP (CANCER CENTER ONLY)
ALT: 18 U/L (ref 0–44)
AST: 32 U/L (ref 15–41)
Albumin: 3.4 g/dL — ABNORMAL LOW (ref 3.5–5.0)
Alkaline Phosphatase: 109 U/L (ref 38–126)
Anion gap: 9 (ref 5–15)
BUN: 10 mg/dL (ref 8–23)
CO2: 26 mmol/L (ref 22–32)
Calcium: 9.1 mg/dL (ref 8.9–10.3)
Chloride: 107 mmol/L (ref 98–111)
Creatinine: 0.8 mg/dL (ref 0.44–1.00)
GFR, Estimated: >60 mL/min (ref >60)
Glucose, Bld: 118 mg/dL — ABNORMAL HIGH (ref 70–99)
Potassium: 3.1 mmol/L — ABNORMAL LOW (ref 3.5–5.1)
Sodium: 142 mmol/L (ref 135–145)
Total Bilirubin: 0.8 mg/dL (ref 0.3–1.2)
Total Protein: 7 g/dL (ref 6.5–8.1)

## 2020-06-25 LAB — CBC WITH DIFFERENTIAL (CANCER CENTER ONLY)
Abs Immature Granulocytes: 0.01 10*3/uL (ref 0.00–0.07)
Basophils Absolute: 0.1 10*3/uL (ref 0.0–0.1)
Basophils Relative: 2 %
Eosinophils Absolute: 0.1 10*3/uL (ref 0.0–0.5)
Eosinophils Relative: 2 %
HCT: 33.9 % — ABNORMAL LOW (ref 36.0–46.0)
Hemoglobin: 11.2 g/dL — ABNORMAL LOW (ref 12.0–15.0)
Immature Granulocytes: 0 %
Lymphocytes Relative: 41 %
Lymphs Abs: 1.5 10*3/uL (ref 0.7–4.0)
MCH: 31 pg (ref 26.0–34.0)
MCHC: 33 g/dL (ref 30.0–36.0)
MCV: 93.9 fL (ref 80.0–100.0)
Monocytes Absolute: 0.7 10*3/uL (ref 0.1–1.0)
Monocytes Relative: 19 %
Neutro Abs: 1.4 10*3/uL — ABNORMAL LOW (ref 1.7–7.7)
Neutrophils Relative %: 36 %
Platelet Count: 79 10*3/uL — ABNORMAL LOW (ref 150–400)
RBC: 3.61 MIL/uL — ABNORMAL LOW (ref 3.87–5.11)
RDW: 15.4 % (ref 11.5–15.5)
WBC Count: 3.7 10*3/uL — ABNORMAL LOW (ref 4.0–10.5)
nRBC: 0 % (ref 0.0–0.2)

## 2020-06-25 LAB — LACTATE DEHYDROGENASE: LDH: 225 U/L — ABNORMAL HIGH (ref 98–192)

## 2020-06-25 LAB — MAGNESIUM: Magnesium: 2 mg/dL (ref 1.7–2.4)

## 2020-06-25 MED ORDER — FLUOROURACIL CHEMO INJECTION 2.5 GM/50ML
300.0000 mg/m2 | Freq: Once | INTRAVENOUS | Status: DC
  Filled 2020-06-25: qty 11

## 2020-06-25 MED ORDER — ACETAMINOPHEN 325 MG PO TABS
ORAL_TABLET | ORAL | Status: AC
  Filled 2020-06-25: qty 2

## 2020-06-25 MED ORDER — DENOSUMAB 120 MG/1.7ML ~~LOC~~ SOLN
SUBCUTANEOUS | Status: AC
  Filled 2020-06-25: qty 1.7

## 2020-06-25 MED ORDER — DEXTROSE 5 % IV SOLN
Freq: Once | INTRAVENOUS | Status: AC
  Filled 2020-06-25: qty 250

## 2020-06-25 MED ORDER — SODIUM CHLORIDE 0.9 % IV SOLN
1800.0000 mg/m2 | INTRAVENOUS | Status: DC
  Administered 2020-06-25: 3250 mg via INTRAVENOUS
  Filled 2020-06-25: qty 65

## 2020-06-25 MED ORDER — PALONOSETRON HCL INJECTION 0.25 MG/5ML
INTRAVENOUS | Status: AC
  Filled 2020-06-25: qty 5

## 2020-06-25 MED ORDER — LEUCOVORIN CALCIUM INJECTION 350 MG
300.0000 mg/m2 | Freq: Once | INTRAVENOUS | Status: AC
  Administered 2020-06-25: 540 mg via INTRAVENOUS
  Filled 2020-06-25: qty 27

## 2020-06-25 MED ORDER — DIPHENHYDRAMINE HCL 25 MG PO CAPS
ORAL_CAPSULE | ORAL | Status: AC
  Filled 2020-06-25: qty 2

## 2020-06-25 MED ORDER — SODIUM CHLORIDE 0.9 % IV SOLN
10.0000 mg | Freq: Once | INTRAVENOUS | Status: AC
  Administered 2020-06-25: 10 mg via INTRAVENOUS
  Filled 2020-06-25: qty 10

## 2020-06-25 MED ORDER — OXALIPLATIN CHEMO INJECTION 100 MG/20ML
84.0000 mg/m2 | Freq: Once | INTRAVENOUS | Status: AC
  Administered 2020-06-25: 150 mg via INTRAVENOUS
  Filled 2020-06-25: qty 10

## 2020-06-25 MED ORDER — DEXAMETHASONE 4 MG PO TABS
ORAL_TABLET | ORAL | Status: AC
  Filled 2020-06-25: qty 5

## 2020-06-25 MED ORDER — PALONOSETRON HCL INJECTION 0.25 MG/5ML
0.2500 mg | Freq: Once | INTRAVENOUS | Status: AC
  Administered 2020-06-25: 0.25 mg via INTRAVENOUS

## 2020-06-25 MED ORDER — POTASSIUM CHLORIDE CRYS ER 20 MEQ PO TBCR: 20.0000 meq | EXTENDED_RELEASE_TABLET | Freq: Every day | ORAL | 0 refills | Status: DC

## 2020-06-25 MED ORDER — SODIUM CHLORIDE 0.9% FLUSH
10.0000 mL | Freq: Once | INTRAVENOUS | Status: AC
  Administered 2020-06-25: 10 mL
  Filled 2020-06-25: qty 10

## 2020-06-25 NOTE — Progress Notes (Signed)
Okay to proceed with treatment with today's  Labs, ANC 1.4 and Platelet 79 per Patrice Paradise PA

## 2020-06-25 NOTE — Patient Instructions (Signed)
[facility] ONCOLOGY  Discharge Instructions: Thank you for choosing [facility] to provide your oncology and hematology care.   If you have a lab appointment with the [facility], please go directly to the [facility] and check in at the registration area.   Wear comfortable clothing and clothing appropriate for easy access to any Portacath or PICC line.   We strive to give you quality time with your provider. You may need to reschedule your appointment if you arrive late (15 or more minutes).  Arriving late affects you and other patients whose appointments are after yours.  Also, if you miss three or more appointments without notifying the office, you may be dismissed from the clinic at the provider's discretion.      For prescription refill requests, have your pharmacy contact our office and allow 72 hours for refills to be completed.    Today you received the following chemotherapy and/or immunotherapy agents [facility] Discharge Instructions for Patients receiving Home Portable Chemo Pump    **The bag should finish at 46 hours, 96 hours or 7 days. For example, if your pump is scheduled for 46 hours and it was put on at 4pm, it should finish at 2 pm the day it is scheduled to come off regardless of your appointment time.    Estimated time to finish   _________________________ (Have your nurse fill in)     ** if the display on your pump reads "Low Volume" and it is beeping, take the batteries out of the pump and come to the cancer center for it to be taken off.   **If the pump alarms go off prior to the pump reading "Low Volume" then call the [phone] and someone can assist you.  **If the plunger comes out and the bag fluid is running out, please use your chemo spill kit to clean up the spill. Do not use paper towels or other house hold products.  ** If you have problems or questions regarding your pump, please call  either the [phone] or the cancer center Monday-Friday 8:00am-4:30pm at [phone] and we will assist you.  If you are unable to get assistance then go to [facility] Emergency Room, ask the staff to contact the IV team for assistance.         To help prevent nausea and vomiting after your treatment, we encourage you to take your nausea medication as directed.  BELOW ARE SYMPTOMS THAT SHOULD BE REPORTED IMMEDIATELY: . *FEVER GREATER THAN 100.4 F (38 C) OR HIGHER . *CHILLS OR SWEATING . *NAUSEA AND VOMITING THAT IS NOT CONTROLLED WITH YOUR NAUSEA MEDICATION . *UNUSUAL SHORTNESS OF BREATH . *UNUSUAL BRUISING OR BLEEDING . *URINARY PROBLEMS (pain or burning when urinating, or frequent urination) . *BOWEL PROBLEMS (unusual diarrhea, constipation, pain near the anus) . TENDERNESS IN MOUTH AND THROAT WITH OR WITHOUT PRESENCE OF ULCERS (sore throat, sores in mouth, or a toothache) . UNUSUAL RASH, SWELLING OR PAIN  . UNUSUAL VAGINAL DISCHARGE OR ITCHING   Items with * indicate a potential emergency and should be followed up as soon as possible or go to the Emergency Department if any problems should occur.  Please show the CHEMOTHERAPY ALERT CARD or IMMUNOTHERAPY ALERT CARD at check-in to the Emergency Department and triage nurse.  Should you have questions after your visit or need to cancel or reschedule your appointment, please contact [facility]  Dept: [phone]  and follow the prompts.  Office hours are 8:00 a.m. to 4:30 p.m. Monday - Friday. Please note that voicemails left after 4:00 p.m. may not be returned until the following business day.  We are closed weekends and major holidays. You have access to a nurse at all times for urgent questions. Please call the main number to the clinic Dept: [phone] and follow the prompts.   For any non-urgent questions, you may also contact your provider using MyChart. We now offer e-Visits for anyone  40 and older to request care online for non-urgent symptoms. For details visit [email or web address].   Also download the MyChart app! Go to the app store, search "MyChart", open the app, select [facility], and log in with your MyChart username and password.  Due to Covid, a mask is required upon entering the hospital/clinic. If you do not have a mask, one will be given to you upon arrival. For doctor visits, patients may have 1 support person aged 86 or older with them. For treatment visits, patients cannot have anyone with them due to current Covid guidelines and our immunocompromised population.

## 2020-06-25 NOTE — Progress Notes (Signed)
[place] [phone]   [phone]) [phone]  PROGRESS NOTE  Patient Care Team: Patient, No Pcp Per (Inactive) as PCP - General (General Practice) [name], RN as Oncology Nurse Navigator [name], MD as Consulting Physician (Hematology and Oncology)  Hematological/Oncological History # Rectal Adenocarcainoma, Stage T3N1M0 1) 01/18/2020: colonoscopy due to bright red blood per rectum. A mass was found in the rectum 8 cm from the anal verge. Biopsy confirms moderately differentiated adenocarcinoma.  2) 01/29/2020: CT chest showed no evidence of metastatic disease. MRI showed a T2-T3 malignancy that appears to involve the internal sphincter and 2 mesorectal nodes 3) 02/07/2020: seen at [facility] by Oncologist Dr. Georgiann Cocker 4) 02/11/2020: seen at [facility] by Dr. [name] 5) 02/21/2020: establish care with Dr. [name]  6) 03/05/2020: Start of Neoadjuvant FOLFOX (anticipate 12-16 weeks of chemotherapy) 7) 03/19/2020: Cycle 2 Day 1 of Neoadjuvant FOLFOX  8) 04/02/2020: Cycle 3 Day 1 of Neoadjuvant FOLFOX  9) 04/16/2020: Cycle 4 Day 1 of Neoadjuvant FOLFOX  10) 04/30/2020: Cycle 5 HELD due to HFS 11) 05/14/2020: Cycle 5 Day 1 of Neoadjuvant FOLFOX (25% dose reduction of the 5-FU due to HFS) 12) 05/28/2020: Cycle 6 Day 1 of Neoadjuvant FOLFOX (25% dose reduction of the 5-FU due to HFS) 13) 06/12/2020: Cycle 7 Day 1 of Neoadjuvant FOLFOX (25% dose reduction of the 5-FU due to HFS) 14) 06/25/2020: Cycle 8 Day 1 of Neoadjuvant FOLFOX (25% dose reduction of the 5-FU due to HFS)  Interval History:  Cheyenne Scott 65 y.o. female with medical history significant for rectal cancer who presents for a follow up visit. The patient's last visit was on 06/12/2020.  On exam today [name] reports she has been well overall in the interim since our last visit.  She has fatigue but continue to complete her ADLs on her own. In addition, she helps to care for  her grandchildren throughout the week. Patient's appetite is stable although she continues to have dry mouth and taste changes. She has intermittent episodes of nausea without vomiting. She only takes one dose of compazine on the day of her 5FU pump disconnect.She notes to have some constipation in the last 1-2 weeks. She suspects this is secondary to increased carb and fiber intake in her diet. She denies any hematochezia or melena. Patient continues to have cold sensitivity but denies any residual neuropathy. Patient reports improvement of peeling and swelling in the hands and feet. She denies any fevers, chills, night sweats, shortness of breath, chest pain or cough. She has no other complaints. Rest of the 10 point ROS is below.   MEDICAL HISTORY:  Past Medical History:  Diagnosis Date  . Acid reflux   . Allergy   . Rectal cancer [facility])     SURGICAL HISTORY: Past Surgical History:  Procedure Laterality Date  . ANTERIOR AND POSTERIOR REPAIR N/A 01/20/2015   Procedure: ANTERIOR (CYSTOCELE) AND POSTERIOR REPAIR (RECTOCELE) WITH PERINEORRHAPHY AND UTEROSACRAL LIGAMENT  SUSPENSION ;  Surgeon: Brien Few, MD;  Location: [place] [facility];  Service: Gynecology;  Laterality: N/A;  . IR IMAGING GUIDED PORT INSERTION  02/26/2020  . ROBOTIC ASSISTED TOTAL HYSTERECTOMY WITH SALPINGECTOMY Bilateral 01/20/2015   Procedure: ROBOTIC ASSISTED TOTAL HYSTERECTOMY WITH BILATERAL SALPINGECTOMY;  Surgeon: Brien Few, MD;  Location: [place] [facility];  Service: Gynecology;  Laterality: Bilateral;  . TUBAL LIGATION      SOCIAL HISTORY: Social History   Socioeconomic History  . Marital status: Widowed    Spouse name:  Not on file  . Number of children: Not on file  . Years of education: Not on file  . Highest education level: Not on file  Occupational History  . Not on file  Tobacco Use  . Smoking status: Never Smoker  . Smokeless tobacco: Never Used  Substance and Sexual Activity  . Alcohol use: No    Alcohol/week:  0.0 standard drinks  . Drug use: No  . Sexual activity: Not on file  Other Topics Concern  . Not on file  Social History Narrative  . Not on file   Social Determinants of Health   Financial Resource Strain: Not on file  Food Insecurity: Not on file  Transportation Needs: Not on file  Physical Activity: Not on file  Stress: Not on file  Social Connections: Not on file  Intimate Partner Violence: Not on file    FAMILY HISTORY: Family History  Problem Relation Age of Onset  . Diabetes Mother   . Heart disease Mother   . Cancer Brother   . Stroke Maternal Grandmother     ALLERGIES:  is allergic to penicillins.  MEDICATIONS:  Current Outpatient Medications  Medication Sig Dispense Refill  . acetaminophen (TYLENOL) 325 MG tablet Take 650 mg by mouth every 6 (six) hours as needed.    [name] ascorbic acid (VITAMIN C) 250 MG tablet Take by mouth.    . Cholecalciferol (VITAMIN D3) 25 MCG (1000 UT) CAPS Take by mouth.    . estradiol (ESTRACE) 0.1 MG/GM vaginal cream Place vaginally.    [name] L-Lysine 500 MG TABS Take by mouth.    . lidocaine-prilocaine (EMLA) cream Apply 1 application topically as needed. 30 g 0  . Multiple Vitamin (MULTIVITAMIN WITH MINERALS) TABS tablet Take 1 tablet by mouth daily.    [name] omeprazole (PRILOSEC OTC) 20 MG tablet Take by mouth.    . ondansetron (ZOFRAN) 8 MG tablet Take 1 tablet (8 mg total) by mouth every 8 (eight) hours as needed for nausea or vomiting. 30 tablet 0  . potassium chloride SA (KLOR-CON) 20 MEQ tablet Take 2 tablets (40 mEq total) by mouth daily. 28 tablet 0  . prochlorperazine (COMPAZINE) 10 MG tablet Take 1 tablet (10 mg total) by mouth every 6 (six) hours as needed for nausea or vomiting. 30 tablet 0  . zinc gluconate 50 MG tablet Take by mouth.     No current facility-administered medications for this visit.    REVIEW OF SYSTEMS:   Constitutional: ( - ) fevers, ( - )  chills , ( - ) night sweats Eyes: ( - ) blurriness of vision, ( - )  double vision, ( - ) watery eyes Ears, nose, mouth, throat, and face: ( - ) mucositis, ( - ) sore throat Respiratory: ( - ) cough, ( - ) dyspnea, ( - ) wheezes Cardiovascular: ( - ) palpitation, ( - ) chest discomfort, ( - ) lower extremity swelling Gastrointestinal:  ( - ) nausea, ( - ) heartburn, (+) change in bowel habits Skin: ( - ) abnormal skin rashes Lymphatics: ( - ) new lymphadenopathy, ( - ) easy bruising Neurological: ( - ) numbness, ( - ) tingling, ( - ) new weaknesses Behavioral/Psych: ( - ) mood change, ( - ) new changes  All other systems were reviewed with the patient and are negative.  PHYSICAL EXAMINATION: ECOG PERFORMANCE STATUS: 1 - Symptomatic but completely ambulatory  Vitals:   06/25/20 1035  BP: 130/68  Pulse: 76  Resp: 18  Temp: 98 F (36.7 C)  SpO2: 100%   Filed Weights   06/25/20 1035  Weight: 166 lb 6.4 oz (75.5 kg)    GENERAL: well appearing middle aged Caucasian female alert, no distress and comfortable SKIN: erythema and dry, peeling skin noted on hands bilaterally. No palmar involvement.  EYES: conjunctiva are pink and non-injected, sclera clear LUNGS: clear to auscultation and percussion with normal breathing effort HEART: regular rate & rhythm and no murmurs and no lower extremity edema Musculoskeletal: no cyanosis of digits and no clubbing  PSYCH: alert & oriented x 3, fluent speech NEURO: no focal motor/sensory deficits  LABORATORY DATA:  I have reviewed the data as listed CBC Latest Ref Rng & Units 06/25/2020 06/12/2020 05/28/2020  WBC 4.0 - 10.5 K/uL 3.7(L) 3.1(L) 4.6  Hemoglobin 12.0 - 15.0 g/dL 11.2(L) 11.5(L) 10.6(L)  Hematocrit 36.0 - 46.0 % 33.9(L) 35.0(L) 32.4(L)  Platelets 150 - 400 K/uL 79(L) 108(L) 97(L)    CMP Latest Ref Rng & Units 06/12/2020 05/28/2020 05/14/2020  Glucose 70 - 99 mg/dL 114(H) 112(H) 93  BUN 8 - 23 mg/dL 10 7(L) 9  Creatinine 0.44 - 1.00 mg/dL 0.82 0.80 0.91  Sodium 135 - 145 mmol/L 141 144 143  Potassium 3.5  - 5.1 mmol/L 3.6 3.3(L) 3.7  Chloride 98 - 111 mmol/L 107 110 106  CO2 22 - 32 mmol/L 25 24 24   Calcium 8.9 - 10.3 mg/dL 9.4 9.0 9.2  Total Protein 6.5 - 8.1 g/dL 7.3 6.9 7.6  Total Bilirubin 0.3 - 1.2 mg/dL 1.0 0.6 0.7  Alkaline Phos 38 - 126 U/L 114 116 106  AST 15 - 41 U/L 33 31 48(H)  ALT 0 - 44 U/L 17 19 36     RADIOGRAPHIC STUDIES: No results found.  ASSESSMENT & PLAN Cheyenne Scott 65 y.o. female with medical history significant for rectal cancer who presents for a follow up visit.  The current plan to treat the patient with FOLFOX chemotherapy every 2 weeks x 8 cycles, for total of 16 weeks for the treatment.  Then we will start chemoradiation with capecitabine 825 mg per metered squared twice daily on days of radiation, typically for about 28 fractions. Once this is complete we will reevaluate with repeat imaging in proceed to surgery if feasible. Previously we discussed the FOLFOX chemotherapy treatment and the expected side effects including nausea, vomiting, diarrhea, neutropenia, nerve pain, and hand-foot syndrome.   Ms. Scifres returns today, 06/25/2020, for Cycle 8, Day 1 of neoadjuvant FOLFOX. Labs from today were reviewed. ANC is stable at 1.4, hemoglobin is stable at 11.2. Platelet count decreased to 79K. Reviewed labs with Dr. [name] and okay to proceed with treatment as planned. Patient will follow up with radiation oncology in the next couple of weeks for simulation and tentative start date for chemoradiation on 07/14/2020. We will send a prescription for capecitabine in the interim. I reviewed the common side effects with capecitabine including cytopenia, nausea, vomiting, diarrhea, hand-foot syndrome and oral mucositis.   # Rectal Adenocarcainoma, Stage T3N1M0 -- Findings consistent with a stage III T3 N1 M0 rectal adenocarcinoma.  Treatment of choice would be total neoadjuvant chemotherapy/ chemoradiation followed by surgical resection. -- Started Cycle 1 Day 1 of  FOLFOX chemotherapy on 03/05/2020.  Today is Cycle 8 Day 1 which is the final cycle.  -- initial pretreatment CEA found to be within normal limits, not likely to be of value moving forward.  --dose reduction of 25% for the Fluorouracil started on 05/14/2020  due to HFS.  --monitor CBC, CMP, and magnesium with each cycle. --Patient will follow up with radiation oncology to undergo simulation. Tentative start date on 07/14/2020. --RTC on 07/16/2020 to see Dr. [name] with labs. Plan to see patient weekly during chemoradiation.   #Hand Foot Syndrome, Grade I --developed after Cycle 3 --Improved at today's visit.  --encourage use of moisturizer -continue to monitor   #Hypokalemia --Today's potassium level is 3.1 --Sent 7 day course of potassium chloride 20 mEq once daily.   #Thrombocytopenia: --Secondary to chemotherapy --Today's platelet count is 79K. Patient denies any sign of bleeding.  --Continue to monitor. Advised to follow up with the clinic if patient notices any sign of bruising/bleeding.   #Neutropenia: --Secondary to chemotherapy --Today's ANC is 1.4.  --Reviewed precautions for neutropenia and to monitor for fevers, chills or other signs of infection.  --Continue to monitor.   #Normocytic Anemia: --Secondary to chemotherapy --Hemoglobin stable at 11.2.  --Continue to monitor.   #Supportive Care --chemotherapy education completed --port in place --zofran 8mg  q8H PRN and compazine 10mg  PO q6H for nausea -- EMLA cream for port -- no pain medication required at this time.   No orders of the defined types were placed in this encounter.  All questions were answered. The patient knows to call the clinic with any problems, questions or concerns.  I have spent a total of 35 minutes minutes of face-to-face and non-face-to-face time, preparing to see the patient, performing a medically appropriate examination, counseling and educating the patient, communicating with other health  care professionals, documenting clinical information in the electronic health record and care coordination.    Dede Query PA-C Department of Hematology/Oncology [place] [facility] Phone: [phone]  06/25/2020 10:39 AM

## 2020-06-26 NOTE — Progress Notes (Signed)
Patient is aware that this is a phone visit . Patient is aware that the nursing portion is complete and that Bryson Ha will call her at 830 on Tuesday.Patient had an appointment on 5/18 /22 within the  [facility] system an states that meaningful use was done at that time.

## 2020-06-27 ENCOUNTER — Inpatient Hospital Stay: Payer: Medicare Other

## 2020-06-27 ENCOUNTER — Other Ambulatory Visit: Payer: Self-pay

## 2020-06-27 DIAGNOSIS — C20 Malignant neoplasm of rectum: Secondary | ICD-10-CM

## 2020-06-27 DIAGNOSIS — Z5111 Encounter for antineoplastic chemotherapy: Secondary | ICD-10-CM | POA: Diagnosis not present

## 2020-06-27 MED ORDER — HEPARIN SOD (PORK) LOCK FLUSH 100 UNIT/ML IV SOLN
500.0000 [IU] | Freq: Once | INTRAVENOUS | Status: AC | PRN
  Administered 2020-06-27: 500 [IU]
  Filled 2020-06-27: qty 5

## 2020-06-27 MED ORDER — FLUOROURACIL CHEMO INJECTION 2.5 GM/50ML
300.0000 mg/m2 | Freq: Once | INTRAVENOUS | Status: AC
  Administered 2020-06-27: 550 mg via INTRAVENOUS
  Filled 2020-06-27: qty 11

## 2020-06-27 MED ORDER — SODIUM CHLORIDE 0.9% FLUSH
10.0000 mL | INTRAVENOUS | Status: DC | PRN
  Administered 2020-06-27: 10 mL
  Filled 2020-06-27: qty 10

## 2020-06-30 ENCOUNTER — Telehealth: Payer: Self-pay | Admitting: Hematology and Oncology

## 2020-06-30 NOTE — Telephone Encounter (Signed)
Scheduled per los. Called and left msg. Mailed printout  °

## 2020-07-01 ENCOUNTER — Ambulatory Visit
Admission: RE | Admit: 2020-07-01 | Discharge: 2020-07-01 | Disposition: A | Discharge status: Home / Self Care | Payer: Medicare Other | Source: Ambulatory Visit | Attending: Radiation Oncology | Admitting: Radiation Oncology

## 2020-07-01 DIAGNOSIS — C20 Malignant neoplasm of rectum: Secondary | ICD-10-CM

## 2020-07-01 NOTE — Progress Notes (Signed)
Radiation Oncology         [phone] ________________________________  Initial Outpatient Consultation - Conducted via telephone due to current COVID-19 concerns for limiting patient exposure  I spoke with the patient to conduct this consult visit via telephone to spare the patient unnecessary potential exposure in the healthcare setting during the current COVID-19 pandemic. The patient was notified in advance and was offered a [place] meeting to allow for face to face communication but unfortunately reported that they did not have the appropriate resources/technology to support such a visit and instead preferred to proceed with a telephone consult.    Name: Cheyenne Scott        MRN: [id number]  Date of Service: 07/01/2020 DOB: 24-Apr-1955  CC: Cheyenne Slick, MD     REFERRING PHYSICIAN: [name], MD   DIAGNOSIS: The encounter diagnosis was Rectal adenocarcinoma [facility]).   HISTORY OF PRESENT ILLNESS: Cheyenne Scott is a 65 y.o. female with  rectal carcinoma. The patient had been noting rectal bleeding shortly after an EGD was performed for acid reflux. She underwent colonoscopy on 01/18/20 that showed a mass in the rectum 8 cm from the anal verge and biopsies were consistent with well differentiated adenocarcinoma. She had staging scans that were negative for metastatic disease in the chest, and MRI of the pelvis revealed the mass seen on colonoscopy and regional mesorectal adenopathy, and the tumor was staged as a T3N1 cancer. She  met with Dr. Lorenso Scott and began total neoadjuvant chemotherapy on 03/05/20 and received her last treatment on 06/27/20. She is contacted today by phone to review the next steps to proceed with chemoRT.    PREVIOUS RADIATION THERAPY: No   PAST MEDICAL HISTORY:  Past Medical History:  Diagnosis Date  . Acid reflux   . Allergy   . Rectal cancer ([place])        PAST SURGICAL HISTORY: Past Surgical History:  Procedure Laterality Date  . ANTERIOR AND  POSTERIOR REPAIR N/A 01/20/2015   Procedure: ANTERIOR (CYSTOCELE) AND POSTERIOR REPAIR (RECTOCELE) WITH PERINEORRHAPHY AND UTEROSACRAL LIGAMENT  SUSPENSION ;  Surgeon: Cheyenne Few, MD;  Location: [facility];  Service: Gynecology;  Laterality: N/A;  . IR IMAGING GUIDED PORT INSERTION  02/26/2020  . ROBOTIC ASSISTED TOTAL HYSTERECTOMY WITH SALPINGECTOMY Bilateral 01/20/2015   Procedure: ROBOTIC ASSISTED TOTAL HYSTERECTOMY WITH BILATERAL SALPINGECTOMY;  Surgeon: Cheyenne Few, MD;  Location: [place] [facility];  Service: Gynecology;  Laterality: Bilateral;  . TUBAL LIGATION       FAMILY HISTORY:  Family History  Problem Relation Age of Onset  . Diabetes Mother   . Heart disease Mother   . Cancer Brother   . Stroke Maternal Grandmother      SOCIAL HISTORY:  reports that she has never smoked. She has never used smokeless tobacco. She reports that she does not drink alcohol and does not use drugs. The patient is widowed and lives in [place]. Her daughter in law Cheyenne Scott is a PACU nurse in the [employer].    ALLERGIES: Penicillins   MEDICATIONS:  Current Outpatient Medications  Medication Sig Dispense Refill  . acetaminophen (TYLENOL) 325 MG tablet Take 650 mg by mouth every 6 (six) hours as needed.    [name] ascorbic acid (VITAMIN C) 250 MG tablet Take by mouth.    . Cholecalciferol (VITAMIN D3) 25 MCG (1000 UT) CAPS Take by mouth.    . estradiol (ESTRACE) 0.1 MG/GM vaginal cream Place vaginally.    [name] L-Lysine 500 MG TABS Take by  mouth.    . lidocaine-prilocaine (EMLA) cream Apply 1 application topically as needed. 30 g 0  . Multiple Vitamin (MULTIVITAMIN WITH MINERALS) TABS tablet Take 1 tablet by mouth daily.    [name] omeprazole (PRILOSEC OTC) 20 MG tablet Take by mouth.    . ondansetron (ZOFRAN) 8 MG tablet Take 1 tablet (8 mg total) by mouth every 8 (eight) hours as needed for nausea or vomiting. 30 tablet 0  . potassium chloride SA (KLOR-CON) 20 MEQ tablet Take 1 tablet (20 mEq total) by mouth daily. 7 tablet  0  . prochlorperazine (COMPAZINE) 10 MG tablet Take 1 tablet (10 mg total) by mouth every 6 (six) hours as needed for nausea or vomiting. 30 tablet 0  . zinc gluconate 50 MG tablet Take by mouth.     No current facility-administered medications for this encounter.     REVIEW OF SYSTEMS: On review of systems, the patient reports that she is doing fairly well. Her main concerns with treatment were with her hand and foot symptoms from chemotherapy as well as fatigue. She has not had recent bleeding from the rectum. Bowels are moving well at this time. She has not had trouble with nausea, vomiting or abdominal pain. She doe have a history of bladder prolapse and is concerned about possible bladder symptoms from treatment. No other complaints are verbalized.      PHYSICAL EXAM:  Unable to assess due to encounter type.  ECOG = 1  0 - Asymptomatic (Fully active, able to carry on all predisease activities without restriction)  1 - Symptomatic but completely ambulatory (Restricted in physically strenuous activity but ambulatory and able to carry out work of a light or sedentary nature. For example, light housework, office work)  2 - Symptomatic, <50% in bed during the day (Ambulatory and capable of all self care but unable to carry out any work activities. Up and about more than 50% of waking hours)  3 - Symptomatic, >50% in bed, but not bedbound (Capable of only limited self-care, confined to bed or chair 50% or more of waking hours)  4 - Bedbound (Completely disabled. Cannot carry on any self-care. Totally confined to bed or chair)  5 - Death   Cheyenne Scott MM, Cheyenne Scott, Cheyenne Scott, et al. [phone]). "Toxicity and response criteria of the [facility]". [place] Oncol. 5 (6): 649-55    LABORATORY DATA:  Lab Results  Component Value Date   WBC 3.7 (L) 06/25/2020   HGB 11.2 (L) 06/25/2020   HCT 33.9 (L) 06/25/2020   MCV 93.9 06/25/2020   PLT 79 (L) 06/25/2020   Lab  Results  Component Value Date   NA 142 06/25/2020   K 3.1 (L) 06/25/2020   CL 107 06/25/2020   CO2 26 06/25/2020   Lab Results  Component Value Date   ALT 18 06/25/2020   AST 32 06/25/2020   ALKPHOS 109 06/25/2020   BILITOT 0.8 06/25/2020      RADIOGRAPHY: No results found.     IMPRESSION/PLAN: 1. Stage IIIB, cT3N1M0 adenocarcinoma of the rectum. Dr. Lisbeth Renshaw reviews the patient's course and treatments to date. He reviews the rationale for chemosensitization with concurrent radiotherapy to the rectum and nodes of the pelvis. She has also met with Dr. Maxie Better at [facility] and Dr. Hilliard Clark at [facility] in colorectal surgery. She unfortunately has not decided who she plans to follow with and have surgery with. I encouraged her that she really needs to make a decisions about  this if possible this week so we can coordinate with the surgeon regarding her radiotherapy dates.  We discussed the risks, benefits, short, and long term effects of radiotherapy, as well as the curative intent, and the patient is interested in proceeding. Dr. Lisbeth Renshaw discusses the delivery and logistics of radiotherapy and recommends 5 1/2 weeks of radiotherapy to the rectum and regional nodes. She will come for simulation on Thursday at which time she will sign written consent to proceed. Our tentative start date for treatment is 07/14/20.  Given current concerns for patient exposure during the COVID-19 pandemic, this encounter was conducted via telephone.  The patient has provided two factor identification and has given verbal consent for this type of encounter and has been advised to only accept a meeting of this type in a secure network environment. The time spent during this encounter was 45 minutes including preparation, discussion, and coordination of the patient's care. The attendants for this meeting include  Dr. Lisbeth Renshaw, Hayden Pedro  and Drema Halon.  During the encounter, Dr. Lisbeth Renshaw, and Hayden Pedro were located  at [facility] Radiation Oncology Department.  Joyanne Eddinger was located at home.   The above documentation reflects my direct findings during this shared patient visit. Please see the separate note by Dr. Lisbeth Renshaw on this date for the remainder of the patient's plan of care.    Carola Rhine, PAC

## 2020-07-03 ENCOUNTER — Other Ambulatory Visit: Payer: Self-pay

## 2020-07-03 ENCOUNTER — Ambulatory Visit
Admission: RE | Admit: 2020-07-03 | Discharge: 2020-07-03 | Disposition: A | Discharge status: Home / Self Care | Payer: Medicare Other | Source: Ambulatory Visit | Attending: Radiation Oncology | Admitting: Radiation Oncology

## 2020-07-03 DIAGNOSIS — C20 Malignant neoplasm of rectum: Secondary | ICD-10-CM | POA: Diagnosis present

## 2020-07-03 DIAGNOSIS — Z510 Encounter for antineoplastic radiation therapy: Secondary | ICD-10-CM | POA: Insufficient documentation

## 2020-07-03 NOTE — Progress Notes (Signed)
The patient was consented today for radiation, during the discussion much of our conversation was regarding the standard of care to include surgical resection of the rectum following neoadjuvant systemic and radiotherapy.  The patient certainly understands the importance of this but is still trying to decide how she would like to proceed.  Her late husband's experience with metastatic pancreatic cancer and complications surgically from infection and his disease at baseline created such a difficult course for him that she is worried about the risks that she might face as well.  She has met with Dr. Maxie Better at [facility] and Dr. Bubba Hales at [place] both in colorectal surgery.  She is interested in a third opinion with Dr. Marcello Moores.  I will reach out to Dr. Marcello Moores to see if there is any way she can be seen prior to initiating her radiotherapy on 07/14/2020.  We will continue to follow the patient's course regarding her surgical decision making and notify whichever surgeon she selects as to the dates and treatment times so that if she is in agreement with surgery this can be performed within an appropriate post radiotherapy interval.     Carola Rhine, PAC

## 2020-07-04 ENCOUNTER — Other Ambulatory Visit: Payer: Medicare Other

## 2020-07-04 ENCOUNTER — Telehealth: Payer: Self-pay

## 2020-07-04 ENCOUNTER — Other Ambulatory Visit: Payer: Self-pay

## 2020-07-04 ENCOUNTER — Inpatient Hospital Stay: Payer: Medicare Other

## 2020-07-04 DIAGNOSIS — R300 Dysuria: Secondary | ICD-10-CM

## 2020-07-04 DIAGNOSIS — Z5111 Encounter for antineoplastic chemotherapy: Secondary | ICD-10-CM | POA: Diagnosis not present

## 2020-07-04 LAB — URINALYSIS, COMPLETE (UACMP) WITH MICROSCOPIC
Bilirubin Urine: NEGATIVE
Glucose, UA: NEGATIVE mg/dL
Hgb urine dipstick: NEGATIVE
Ketones, ur: NEGATIVE mg/dL
Nitrite: NEGATIVE
Protein, ur: NEGATIVE mg/dL
Specific Gravity, Urine: 1.003 — ABNORMAL LOW (ref 1.005–1.030)
pH: 5 (ref 5.0–8.0)

## 2020-07-04 NOTE — Progress Notes (Signed)
Patient calls complaining of dysuria, tried to get in with PCP unable to for today.  Consulted with Dr. [name] and advised patient to come in to supply UA for testing, she was given a lab appointment at 11:45.  Explained she will be called with results.  She verbalized an understanding.

## 2020-07-04 NOTE — Telephone Encounter (Signed)
Called patient to let her know per Dr. [name] no evidence of UTI, awaiting culture, instructed her to push her po fluids this weekend.  She verbalized an understanding.

## 2020-07-05 LAB — URINE CULTURE: Culture: NO GROWTH

## 2020-07-09 ENCOUNTER — Ambulatory Visit: Payer: Medicare Other | Admitting: Physician Assistant

## 2020-07-09 ENCOUNTER — Ambulatory Visit: Payer: Medicare Other

## 2020-07-09 ENCOUNTER — Other Ambulatory Visit: Payer: Medicare Other

## 2020-07-09 DIAGNOSIS — C20 Malignant neoplasm of rectum: Secondary | ICD-10-CM | POA: Diagnosis present

## 2020-07-09 DIAGNOSIS — Z510 Encounter for antineoplastic radiation therapy: Secondary | ICD-10-CM | POA: Diagnosis present

## 2020-07-14 ENCOUNTER — Ambulatory Visit: Payer: Medicare Other

## 2020-07-14 ENCOUNTER — Other Ambulatory Visit: Payer: Self-pay

## 2020-07-14 ENCOUNTER — Ambulatory Visit
Admission: RE | Admit: 2020-07-14 | Discharge: 2020-07-14 | Disposition: A | Discharge status: Home / Self Care | Payer: Medicare Other | Source: Ambulatory Visit | Attending: Radiation Oncology | Admitting: Radiation Oncology

## 2020-07-14 DIAGNOSIS — Z510 Encounter for antineoplastic radiation therapy: Secondary | ICD-10-CM | POA: Diagnosis not present

## 2020-07-15 ENCOUNTER — Encounter: Payer: Self-pay | Admitting: Hematology and Oncology

## 2020-07-15 ENCOUNTER — Ambulatory Visit
Admission: RE | Admit: 2020-07-15 | Discharge: 2020-07-15 | Disposition: A | Discharge status: Home / Self Care | Payer: Medicare Other | Source: Ambulatory Visit | Attending: Radiation Oncology | Admitting: Radiation Oncology

## 2020-07-15 ENCOUNTER — Telehealth: Payer: Self-pay | Admitting: Pharmacist

## 2020-07-15 ENCOUNTER — Ambulatory Visit: Payer: Medicare Other

## 2020-07-15 ENCOUNTER — Telehealth: Payer: Self-pay

## 2020-07-15 ENCOUNTER — Other Ambulatory Visit (HOSPITAL_COMMUNITY): Payer: Self-pay

## 2020-07-15 ENCOUNTER — Other Ambulatory Visit: Payer: Self-pay | Admitting: Hematology and Oncology

## 2020-07-15 DIAGNOSIS — C20 Malignant neoplasm of rectum: Secondary | ICD-10-CM

## 2020-07-15 DIAGNOSIS — Z510 Encounter for antineoplastic radiation therapy: Secondary | ICD-10-CM | POA: Diagnosis not present

## 2020-07-15 MED ORDER — CAPECITABINE 500 MG PO TABS
1500.0000 mg | ORAL_TABLET | Freq: Two times a day (BID) | ORAL | 0 refills | Status: DC
  Filled 2020-07-15: qty 150, 25d supply, fill #0
  Filled 2020-07-15: qty 150, 30d supply, fill #0
  Filled 2020-07-15: qty 150, 25d supply, fill #0

## 2020-07-15 MED ORDER — CAPECITABINE 500 MG PO TABS
1500.0000 mg | ORAL_TABLET | Freq: Two times a day (BID) | ORAL | 0 refills | Status: DC
  Filled 2020-07-15: qty 150, 25d supply, fill #0

## 2020-07-15 NOTE — Addendum Note (Signed)
Addended byBritt Boozer on: 07/15/2020 02:57 PM   Modules accepted: Orders

## 2020-07-15 NOTE — Telephone Encounter (Addendum)
Oral Oncology Pharmacist Encounter  Received new prescription for Xeloda (capecitabine) for the neoadjuvant treatment of rectal cancer in conjunction with radiation, planned for duration of radiation.  Prescription dose and frequency assessed for appropriateness. Appropriate for therapy initiation.   CBC w/ Diff and CMP from 06/25/20 assessed, no renal or hepatic dose adjustments required at this time.  Current medication list in Epic reviewed, DDIs with Xeloda identified: Category C DDI between Xeloda and Omeprazole - proton-pump inhibitors can decrease efficacy of Xeloda - will discuss with patient alternatives to omeprazole, such as H2RA's like famotidine while on Xeloda.  Evaluated chart and no patient barriers to medication adherence noted.   Prescription has been e-scribed to the [facility] for benefits analysis and approval.  Oral Oncology Clinic will continue to follow for insurance authorization, copayment issues, initial counseling and start date.  Leron Croak, PharmD, BCPS Hematology/Oncology Clinical Pharmacist [facility] [phone] 07/15/2020 8:59 AM

## 2020-07-15 NOTE — Telephone Encounter (Signed)
Oral Chemotherapy Pharmacist Encounter   Attempted to reach patient to provide update and offer for initial counseling on oral medication: Xeloda (capecitabine).   No answer. Left voicemail for patient to call back to discuss details of medication acquisition and initial counseling session.  Leron Croak, PharmD, BCPS Hematology/Oncology Clinical Pharmacist [facility] [phone] 07/15/2020 1:29 PM

## 2020-07-15 NOTE — Telephone Encounter (Signed)
Oral Chemotherapy Pharmacist Encounter  I spoke with patient for overview of: Xeloda for the neoadjuvant treatment of rectal cancer in conjunction with radiation, planned for duration of radiation.  Counseled patient on administration, dosing, side effects, monitoring, drug-food interactions, safe handling, storage, and disposal.  Patient will take Xeloda 500mg  tablets, 3 tablets (1500mg ) by mouth in AM and 3 tabs (1500mg ) by mouth in PM, within 30 minutes of finishing meals, on days of radiation only.  Radiation start date: 07/14/20 Xeloda start date: 07/16/20 PM  Adverse effects of Xeloda include but are not limited to: fatigue, decreased blood counts, GI upset, diarrhea, mouth sores, and hand-foot syndrome.  Patient has anti-emetic on hand and knows to take it if nausea develops.   Patient will obtain anti diarrheal and alert the office of 4 or more loose stools above baseline.   Reviewed with patient importance of keeping a medication schedule and plan for any missed doses. No barriers to medication adherence identified.  Medication reconciliation performed and medication/allergy list updated. Patient will not take omeprazole while she is on Xeloda, and will use famotidine if needed. Medication list updated.   Insurance authorization for Xeloda has been obtained. Test claim at the pharmacy revealed copayment $19.15 for 1st fill of Xeloda. Patient will pick this up from the [facility] after radiation on 07/16/20.   Patient informed the pharmacy will reach out 5-7 days prior to needing next fill of Xeloda to coordinate continued medication acquisition to prevent break in therapy.  All questions answered.  Ms. Colclough voiced understanding and appreciation.   Medication education handout placed in mail for patient. Patient knows to call the office with questions or concerns. Oral Chemotherapy Clinic phone number provided to patient.   Leron Croak, PharmD,  BCPS Hematology/Oncology Clinical Pharmacist [facility] [phone] 07/15/2020 2:54 PM

## 2020-07-15 NOTE — Telephone Encounter (Signed)
Oral Oncology Patient Advocate Encounter  After completing a benefits investigation, prior authorization for Xeloda is not required at this time through [employer].  Patient's copay is $19.15.     [place] Patient [place] Phone [phone] Fax [phone] 07/15/2020 12:18 PM

## 2020-07-16 ENCOUNTER — Other Ambulatory Visit (HOSPITAL_COMMUNITY): Payer: Self-pay

## 2020-07-16 ENCOUNTER — Encounter: Payer: Self-pay | Admitting: Hematology and Oncology

## 2020-07-16 ENCOUNTER — Other Ambulatory Visit: Payer: Self-pay

## 2020-07-16 ENCOUNTER — Inpatient Hospital Stay (HOSPITAL_BASED_OUTPATIENT_CLINIC_OR_DEPARTMENT_OTHER): Payer: Medicare Other | Admitting: Hematology and Oncology

## 2020-07-16 ENCOUNTER — Ambulatory Visit
Admission: RE | Admit: 2020-07-16 | Discharge: 2020-07-16 | Disposition: A | Discharge status: Home / Self Care | Payer: Medicare Other | Source: Ambulatory Visit | Attending: Radiation Oncology | Admitting: Radiation Oncology

## 2020-07-16 ENCOUNTER — Ambulatory Visit: Payer: Medicare Other

## 2020-07-16 ENCOUNTER — Inpatient Hospital Stay: Payer: Medicare Other

## 2020-07-16 VITALS — BP 135/74 | HR 84 | Temp 98.2°F | Resp 18 | Wt 163.4 lb

## 2020-07-16 DIAGNOSIS — C20 Malignant neoplasm of rectum: Secondary | ICD-10-CM | POA: Insufficient documentation

## 2020-07-16 DIAGNOSIS — Z95828 Presence of other vascular implants and grafts: Secondary | ICD-10-CM | POA: Diagnosis not present

## 2020-07-16 DIAGNOSIS — Z5111 Encounter for antineoplastic chemotherapy: Secondary | ICD-10-CM | POA: Insufficient documentation

## 2020-07-16 DIAGNOSIS — L271 Localized skin eruption due to drugs and medicaments taken internally: Secondary | ICD-10-CM | POA: Insufficient documentation

## 2020-07-16 DIAGNOSIS — T451X5A Adverse effect of antineoplastic and immunosuppressive drugs, initial encounter: Secondary | ICD-10-CM | POA: Insufficient documentation

## 2020-07-16 DIAGNOSIS — Z510 Encounter for antineoplastic radiation therapy: Secondary | ICD-10-CM | POA: Diagnosis not present

## 2020-07-16 LAB — CBC WITH DIFFERENTIAL (CANCER CENTER ONLY)
Abs Immature Granulocytes: 0.02 10*3/uL (ref 0.00–0.07)
Basophils Absolute: 0.1 10*3/uL (ref 0.0–0.1)
Basophils Relative: 3 %
Eosinophils Absolute: 0.1 10*3/uL (ref 0.0–0.5)
Eosinophils Relative: 1 %
HCT: 37.5 % (ref 36.0–46.0)
Hemoglobin: 12.4 g/dL (ref 12.0–15.0)
Immature Granulocytes: 1 %
Lymphocytes Relative: 49 %
Lymphs Abs: 1.7 10*3/uL (ref 0.7–4.0)
MCH: 31.2 pg (ref 26.0–34.0)
MCHC: 33.1 g/dL (ref 30.0–36.0)
MCV: 94.5 fL (ref 80.0–100.0)
Monocytes Absolute: 0.8 10*3/uL (ref 0.1–1.0)
Monocytes Relative: 23 %
Neutro Abs: 0.8 10*3/uL — ABNORMAL LOW (ref 1.7–7.7)
Neutrophils Relative %: 23 %
Platelet Count: 132 10*3/uL — ABNORMAL LOW (ref 150–400)
RBC: 3.97 MIL/uL (ref 3.87–5.11)
RDW: 15.9 % — ABNORMAL HIGH (ref 11.5–15.5)
WBC Count: 3.5 10*3/uL — ABNORMAL LOW (ref 4.0–10.5)
nRBC: 0 % (ref 0.0–0.2)

## 2020-07-16 LAB — CMP (CANCER CENTER ONLY)
ALT: 15 U/L (ref 0–44)
AST: 35 U/L (ref 15–41)
Albumin: 3.6 g/dL (ref 3.5–5.0)
Alkaline Phosphatase: 123 U/L (ref 38–126)
Anion gap: 12 (ref 5–15)
BUN: 7 mg/dL — ABNORMAL LOW (ref 8–23)
CO2: 24 mmol/L (ref 22–32)
Calcium: 9.6 mg/dL (ref 8.9–10.3)
Chloride: 105 mmol/L (ref 98–111)
Creatinine: 0.76 mg/dL (ref 0.44–1.00)
GFR, Estimated: >60 mL/min (ref >60)
Glucose, Bld: 113 mg/dL — ABNORMAL HIGH (ref 70–99)
Potassium: 3.2 mmol/L — ABNORMAL LOW (ref 3.5–5.1)
Sodium: 141 mmol/L (ref 135–145)
Total Bilirubin: 1 mg/dL (ref 0.3–1.2)
Total Protein: 7.3 g/dL (ref 6.5–8.1)

## 2020-07-16 LAB — LACTATE DEHYDROGENASE: LDH: 196 U/L — ABNORMAL HIGH (ref 98–192)

## 2020-07-16 LAB — MAGNESIUM: Magnesium: 2 mg/dL (ref 1.7–2.4)

## 2020-07-16 NOTE — Progress Notes (Signed)
[place] [phone]   [phone]  PROGRESS NOTE  Patient Care Team: Patient, No Pcp Per (Inactive) as PCP - General (General Practice) [name], RN as Oncology Nurse Navigator [name], MD as Consulting Physician (Hematology and Oncology)  Hematological/Oncological History # Rectal Adenocarcainoma, Stage T3N1M0 1) 01/18/2020: colonoscopy due to bright red blood per rectum. A mass was found in the rectum 8 cm from the anal verge. Biopsy confirms moderately differentiated adenocarcinoma.  2) 01/29/2020: CT chest showed no evidence of metastatic disease. MRI showed a T2-T3 malignancy that appears to involve the internal sphincter and 2 mesorectal nodes 3) 02/07/2020: seen at [facility] by Oncologist Dr. Georgiann Cocker 4) 02/11/2020: seen at [facility] by Dr. [name] 5) 02/21/2020: establish care with Dr. [name]  6) 03/05/2020: Start of Neoadjuvant FOLFOX (anticipate 12-16 weeks of chemotherapy) 7) 03/19/2020: Cycle 2 Day 1 of Neoadjuvant FOLFOX  8) 04/02/2020: Cycle 3 Day 1 of Neoadjuvant FOLFOX  9) 04/16/2020: Cycle 4 Day 1 of Neoadjuvant FOLFOX  10) 04/30/2020: Cycle 5 HELD due to HFS 11) 05/14/2020: Cycle 5 Day 1 of Neoadjuvant FOLFOX (25% dose reduction of the 5-FU due to HFS) 12) 05/28/2020: Cycle 6 Day 1 of Neoadjuvant FOLFOX (25% dose reduction of the 5-FU due to HFS) 13) 06/12/2020: Cycle 7 Day 1 of Neoadjuvant FOLFOX (25% dose reduction of the 5-FU due to HFS) 14) 06/25/2020: Cycle 8 Day 1 of Neoadjuvant FOLFOX (25% dose reduction of the 5-FU due to HFS) 15) 07/14/2020: start of chemoradiation with capecitabine 1500mg  PO BID (825mg /m2)  Interval History:  Cheyenne Scott 65 y.o. female with medical history significant for rectal cancer who presents for a follow up visit. The patient's last visit was on 06/25/2020. In the interim since the last visit Cheyenne Scott has completed FOLFOX and has started chemoradiation therapy.   On  exam today Cheyenne Scott reports she continues to have some dryness and peeling of her fingers bilaterally.  It appears to be mostly affecting the right hand.  She notes that she is not having any of this on her feet.  She is happy that she had a "nice break" in between chemotherapy and the start of radiation.  She notes that she has "perked up" and that she was able to take a trip to the mountains where she felt quite good.  She is also excited to be overdrink cool drinks again after discontinuing FOLFOX.  She notes the only other issue is she is having some urinary symptoms, but thinks this may be secondary to increased p.o. intake of water versus prolapsed bladder issues she has had in the past.   She denies any fevers, chills, sweats, vomiting or diarrhea.  Full 10 point ROS is listed below.   MEDICAL HISTORY:  Past Medical History:  Diagnosis Date  . Acid reflux   . Allergy   . Rectal cancer [facility])     SURGICAL HISTORY: Past Surgical History:  Procedure Laterality Date  . ANTERIOR AND POSTERIOR REPAIR N/A 01/20/2015   Procedure: ANTERIOR (CYSTOCELE) AND POSTERIOR REPAIR (RECTOCELE) WITH PERINEORRHAPHY AND UTEROSACRAL LIGAMENT  SUSPENSION ;  Surgeon: Brien Few, MD;  Location: [facility];  Service: Gynecology;  Laterality: N/A;  . IR IMAGING GUIDED PORT INSERTION  02/26/2020  . ROBOTIC ASSISTED TOTAL HYSTERECTOMY WITH SALPINGECTOMY Bilateral 01/20/2015   Procedure: ROBOTIC ASSISTED TOTAL HYSTERECTOMY WITH BILATERAL SALPINGECTOMY;  Surgeon: Brien Few, MD;  Location: [facility];  Service: Gynecology;  Laterality: Bilateral;  . TUBAL LIGATION  SOCIAL HISTORY: Social History   Socioeconomic History  . Marital status: Widowed    Spouse name: Not on file  . Number of children: Not on file  . Years of education: Not on file  . Highest education level: Not on file  Occupational History  . Not on file  Tobacco Use  . Smoking status: Never Smoker  . Smokeless tobacco: Never Used   Substance and Sexual Activity  . Alcohol use: No    Alcohol/week: 0.0 standard drinks  . Drug use: No  . Sexual activity: Not on file  Other Topics Concern  . Not on file  Social History Narrative  . Not on file   Social Determinants of Health   Financial Resource Strain: Not on file  Food Insecurity: Not on file  Transportation Needs: Not on file  Physical Activity: Not on file  Stress: Not on file  Social Connections: Not on file  Intimate Partner Violence: Not on file    FAMILY HISTORY: Family History  Problem Relation Age of Onset  . Diabetes Mother   . Heart disease Mother   . Cancer Brother   . Stroke Maternal Grandmother     ALLERGIES:  is allergic to penicillins.  MEDICATIONS:  Current Outpatient Medications  Medication Sig Dispense Refill  . acetaminophen (TYLENOL) 325 MG tablet Take 650 mg by mouth every 6 (six) hours as needed.    [name] ascorbic acid (VITAMIN C) 250 MG tablet Take by mouth.    . capecitabine (XELODA) 500 MG tablet Take 3 tablets (1,500 mg total) by mouth 2 (two) times daily after a meal. Take only on days of radiation Monday through Friday. 150 tablet 0  . Cholecalciferol (VITAMIN D3) 25 MCG (1000 UT) CAPS Take by mouth.    . estradiol (ESTRACE) 0.1 MG/GM vaginal cream Place vaginally.    [name] L-Lysine 500 MG TABS Take by mouth.    . lidocaine-prilocaine (EMLA) cream Apply 1 application topically as needed. 30 g 0  . Multiple Vitamin (MULTIVITAMIN WITH MINERALS) TABS tablet Take 1 tablet by mouth daily.    . ondansetron (ZOFRAN) 8 MG tablet Take 1 tablet (8 mg total) by mouth every 8 (eight) hours as needed for nausea or vomiting. 30 tablet 0  . potassium chloride SA (KLOR-CON) 20 MEQ tablet Take 1 tablet (20 mEq total) by mouth daily. 7 tablet 0  . prochlorperazine (COMPAZINE) 10 MG tablet Take 1 tablet (10 mg total) by mouth every 6 (six) hours as needed for nausea or vomiting. 30 tablet 0  . zinc gluconate 50 MG tablet Take by mouth.     No  current facility-administered medications for this visit.    REVIEW OF SYSTEMS:   Constitutional: ( - ) fevers, ( - )  chills , ( - ) night sweats Eyes: ( - ) blurriness of vision, ( - ) double vision, ( - ) watery eyes Ears, nose, mouth, throat, and face: ( - ) mucositis, ( - ) sore throat Respiratory: ( - ) cough, ( - ) dyspnea, ( - ) wheezes Cardiovascular: ( - ) palpitation, ( - ) chest discomfort, ( - ) lower extremity swelling Gastrointestinal:  ( - ) nausea, ( - ) heartburn, ( - ) change in bowel habits Skin: ( - ) abnormal skin rashes Lymphatics: ( - ) new lymphadenopathy, ( - ) easy bruising Neurological: ( - ) numbness, ( - ) tingling, ( - ) new weaknesses Behavioral/Psych: ( - ) mood change, ( - )  new changes  All other systems were reviewed with the patient and are negative.  PHYSICAL EXAMINATION: ECOG PERFORMANCE STATUS: 1 - Symptomatic but completely ambulatory  Vitals:   07/16/20 1416  BP: 135/74  Pulse: 84  Resp: 18  Temp: 98.2 F (36.8 C)  SpO2: 100%   Filed Weights   07/16/20 1416  Weight: 163 lb 6.4 oz (74.1 kg)    GENERAL: well appearing middle aged Caucasian female alert, no distress and comfortable SKIN: erythema and dry, peeling skin noted on hands bilaterally. No palmar involvement.  EYES: conjunctiva are pink and non-injected, sclera clear LUNGS: clear to auscultation and percussion with normal breathing effort HEART: regular rate & rhythm and no murmurs and no lower extremity edema Musculoskeletal: no cyanosis of digits and no clubbing  PSYCH: alert & oriented x 3, fluent speech NEURO: no focal motor/sensory deficits  LABORATORY DATA:  I have reviewed the data as listed CBC Latest Ref Rng & Units 07/16/2020 06/25/2020 06/12/2020  WBC 4.0 - 10.5 K/uL 3.5(L) 3.7(L) 3.1(L)  Hemoglobin 12.0 - 15.0 g/dL 12.4 11.2(L) 11.5(L)  Hematocrit 36.0 - 46.0 % 37.5 33.9(L) 35.0(L)  Platelets 150 - 400 K/uL 132(L) 79(L) 108(L)    CMP Latest Ref Rng & Units  07/16/2020 06/25/2020 06/12/2020  Glucose 70 - 99 mg/dL 113(H) 118(H) 114(H)  BUN 8 - 23 mg/dL 7(L) 10 10  Creatinine 0.44 - 1.00 mg/dL 0.76 0.80 0.82  Sodium 135 - 145 mmol/L 141 142 141  Potassium 3.5 - 5.1 mmol/L 3.2(L) 3.1(L) 3.6  Chloride 98 - 111 mmol/L 105 107 107  CO2 22 - 32 mmol/L 24 26 25   Calcium 8.9 - 10.3 mg/dL 9.6 9.1 9.4  Total Protein 6.5 - 8.1 g/dL 7.3 7.0 7.3  Total Bilirubin 0.3 - 1.2 mg/dL 1.0 0.8 1.0  Alkaline Phos 38 - 126 U/L 123 109 114  AST 15 - 41 U/L 35 32 33  ALT 0 - 44 U/L 15 18 17      RADIOGRAPHIC STUDIES: No results found.  ASSESSMENT & PLAN Cheyenne Scott 65 y.o. female with medical history significant for rectal cancer who presents for a follow up visit.  The current plan to treat the patient with FOLFOX chemotherapy every 2 weeks x 8 cycles, for total of 16 weeks for the treatment.  Then we will start chemoradiation with capecitabine 825 mg per metered squared twice daily on days of radiation, typically for about 28 fractions.  Cheyenne Scott established with Dr. Lisbeth Renshaw who is performing the radiation component of her treatment.  Once this is complete we will reevaluate with repeat imaging in proceed to surgery if feasible.  The patient and her daughter-in-law voiced their understanding of this plan moving forward.  Previously we discussed the FOLFOX chemotherapy treatment and the expected side effects including nausea, vomiting, diarrhea, neutropenia, nerve pain, and hand-foot syndrome.  Discussed that these are the most common and dangerous side effects, but is not limited to these alone.  The patient was agreeable to proceeding with FOLFOX chemotherapy at this time.  We will plan for chemotherapy education as well as port placement as soon as possible.  # Rectal Adenocarcainoma, Stage T3N1M0 -- Findings consistent with a stage III T3 N1 M0 rectal adenocarcinoma.  Treatment of choice would be total neoadjuvant chemotherapy/ chemoradiation followed by  surgical resection. -- Started Cycle 1 Day 1 of FOLFOX chemotherapy on 03/05/2020. She comopleted 8 cycles total.  -- initial pretreatment CEA found to be within normal limits, not likely to be  of value moving forward.  --plan to start Xeloda 1500 mg PO BID (825mg /m2) on 07/16/2020. Radiation therapy started on 07/14/2020. Xeloda to be taken on radiation days only.  --monitor CBC, CMP, and magnesium with each cycle. --once chemoradiation is complete will perform repeat scans (typically 8 weeks after completion of chemoradiation) and connect back with surgical service.  --RTC in 2 weeks for continued monitoring on Xeloda   #Hand Foot Syndrome, Grade II --developed after Cycle 3 of FOLFOX --encourage use of moisturizer -continue to monitor   #Supportive Care --chemotherapy education completed --port in place --zofran 8mg  q8H PRN and compazine 10mg  PO q6H for nausea -- EMLA cream for port -- no pain medication required at this time.   No orders of the defined types were placed in this encounter.  All questions were answered. The patient knows to call the clinic with any problems, questions or concerns.  A total of more than 30 minutes were spent on this encounter and over half of that time was spent on counseling and coordination of care as outlined above.   Ledell Peoples, MD Department of Hematology/Oncology Hall at [facility] Phone: [phone] Pager: [phone] Email: [email or web address]  07/16/2020 5:10 PM

## 2020-07-17 ENCOUNTER — Ambulatory Visit
Admission: RE | Admit: 2020-07-17 | Discharge: 2020-07-17 | Disposition: A | Discharge status: Home / Self Care | Payer: Medicare Other | Source: Ambulatory Visit | Attending: Radiation Oncology | Admitting: Radiation Oncology

## 2020-07-17 ENCOUNTER — Other Ambulatory Visit (HOSPITAL_COMMUNITY): Payer: Self-pay

## 2020-07-17 ENCOUNTER — Ambulatory Visit: Payer: Medicare Other

## 2020-07-17 DIAGNOSIS — Z510 Encounter for antineoplastic radiation therapy: Secondary | ICD-10-CM | POA: Diagnosis not present

## 2020-07-18 ENCOUNTER — Other Ambulatory Visit: Payer: Self-pay

## 2020-07-18 ENCOUNTER — Ambulatory Visit
Admission: RE | Admit: 2020-07-18 | Discharge: 2020-07-18 | Disposition: A | Discharge status: Home / Self Care | Payer: Medicare Other | Source: Ambulatory Visit | Attending: Radiation Oncology | Admitting: Radiation Oncology

## 2020-07-18 ENCOUNTER — Ambulatory Visit: Payer: Medicare Other

## 2020-07-18 ENCOUNTER — Telehealth: Payer: Self-pay | Admitting: Hematology and Oncology

## 2020-07-18 DIAGNOSIS — Z510 Encounter for antineoplastic radiation therapy: Secondary | ICD-10-CM | POA: Diagnosis not present

## 2020-07-18 NOTE — Telephone Encounter (Signed)
Scheduled per los. Called, not able to leave msg. Mailed printout  

## 2020-07-18 NOTE — Progress Notes (Signed)
Pt here for patient teaching.  Pt given Radiation and You booklet.  Reviewed areas of pertinence such as diarrhea, fatigue, hair loss, sexual and fertility changes, skin changes, and urinary and bladder changes . Pt able to give teach back of to pat skin, use unscented/gentle soap, use baby wipes, have Imodium on hand, drink plenty of water, and sitz bath,avoid applying anything to skin within 4 hours of treatment. Pt verbalizes understanding of information given and will contact nursing with any questions or concerns.     [email or web address]

## 2020-07-21 ENCOUNTER — Other Ambulatory Visit: Payer: Self-pay

## 2020-07-21 ENCOUNTER — Ambulatory Visit
Admission: RE | Admit: 2020-07-21 | Discharge: 2020-07-21 | Disposition: A | Discharge status: Home / Self Care | Payer: Medicare Other | Source: Ambulatory Visit | Attending: Radiation Oncology | Admitting: Radiation Oncology

## 2020-07-21 ENCOUNTER — Ambulatory Visit: Payer: Medicare Other

## 2020-07-21 DIAGNOSIS — Z510 Encounter for antineoplastic radiation therapy: Secondary | ICD-10-CM | POA: Diagnosis not present

## 2020-07-22 ENCOUNTER — Ambulatory Visit: Payer: Medicare Other | Admitting: Radiation Oncology

## 2020-07-22 ENCOUNTER — Ambulatory Visit: Payer: Medicare Other

## 2020-07-22 ENCOUNTER — Ambulatory Visit
Admission: RE | Admit: 2020-07-22 | Discharge: 2020-07-22 | Disposition: A | Discharge status: Home / Self Care | Payer: Medicare Other | Source: Ambulatory Visit | Attending: Radiation Oncology | Admitting: Radiation Oncology

## 2020-07-22 ENCOUNTER — Other Ambulatory Visit: Payer: Self-pay

## 2020-07-22 DIAGNOSIS — Z510 Encounter for antineoplastic radiation therapy: Secondary | ICD-10-CM | POA: Diagnosis not present

## 2020-07-23 ENCOUNTER — Ambulatory Visit: Payer: Medicare Other

## 2020-07-23 ENCOUNTER — Other Ambulatory Visit: Payer: Self-pay

## 2020-07-23 ENCOUNTER — Ambulatory Visit
Admission: RE | Admit: 2020-07-23 | Discharge: 2020-07-23 | Disposition: A | Discharge status: Home / Self Care | Payer: Medicare Other | Source: Ambulatory Visit | Attending: Radiation Oncology | Admitting: Radiation Oncology

## 2020-07-23 ENCOUNTER — Inpatient Hospital Stay: Payer: Medicare Other

## 2020-07-23 DIAGNOSIS — Z95828 Presence of other vascular implants and grafts: Secondary | ICD-10-CM

## 2020-07-23 DIAGNOSIS — C20 Malignant neoplasm of rectum: Secondary | ICD-10-CM

## 2020-07-23 DIAGNOSIS — Z510 Encounter for antineoplastic radiation therapy: Secondary | ICD-10-CM | POA: Diagnosis not present

## 2020-07-23 LAB — CMP (CANCER CENTER ONLY)
ALT: 14 U/L (ref 0–44)
AST: 34 U/L (ref 15–41)
Albumin: 3.7 g/dL (ref 3.5–5.0)
Alkaline Phosphatase: 115 U/L (ref 38–126)
Anion gap: 10 (ref 5–15)
BUN: 10 mg/dL (ref 8–23)
CO2: 25 mmol/L (ref 22–32)
Calcium: 9.4 mg/dL (ref 8.9–10.3)
Chloride: 106 mmol/L (ref 98–111)
Creatinine: 0.73 mg/dL (ref 0.44–1.00)
GFR, Estimated: >60 mL/min (ref >60)
Glucose, Bld: 98 mg/dL (ref 70–99)
Potassium: 3.4 mmol/L — ABNORMAL LOW (ref 3.5–5.1)
Sodium: 141 mmol/L (ref 135–145)
Total Bilirubin: 1 mg/dL (ref 0.3–1.2)
Total Protein: 7.3 g/dL (ref 6.5–8.1)

## 2020-07-23 LAB — CBC WITH DIFFERENTIAL (CANCER CENTER ONLY)
Abs Immature Granulocytes: 0.01 10*3/uL (ref 0.00–0.07)
Basophils Absolute: 0.1 10*3/uL (ref 0.0–0.1)
Basophils Relative: 2 %
Eosinophils Absolute: 0 10*3/uL (ref 0.0–0.5)
Eosinophils Relative: 1 %
HCT: 35.1 % — ABNORMAL LOW (ref 36.0–46.0)
Hemoglobin: 11.8 g/dL — ABNORMAL LOW (ref 12.0–15.0)
Immature Granulocytes: 0 %
Lymphocytes Relative: 31 %
Lymphs Abs: 1.1 10*3/uL (ref 0.7–4.0)
MCH: 32.1 pg (ref 26.0–34.0)
MCHC: 33.6 g/dL (ref 30.0–36.0)
MCV: 95.4 fL (ref 80.0–100.0)
Monocytes Absolute: 0.6 10*3/uL (ref 0.1–1.0)
Monocytes Relative: 18 %
Neutro Abs: 1.6 10*3/uL — ABNORMAL LOW (ref 1.7–7.7)
Neutrophils Relative %: 48 %
Platelet Count: 135 10*3/uL — ABNORMAL LOW (ref 150–400)
RBC: 3.68 MIL/uL — ABNORMAL LOW (ref 3.87–5.11)
RDW: 15.8 % — ABNORMAL HIGH (ref 11.5–15.5)
WBC Count: 3.4 10*3/uL — ABNORMAL LOW (ref 4.0–10.5)
nRBC: 0 % (ref 0.0–0.2)

## 2020-07-23 LAB — MAGNESIUM: Magnesium: 2 mg/dL (ref 1.7–2.4)

## 2020-07-23 LAB — LACTATE DEHYDROGENASE: LDH: 232 U/L — ABNORMAL HIGH (ref 98–192)

## 2020-07-23 MED ORDER — HEPARIN SOD (PORK) LOCK FLUSH 100 UNIT/ML IV SOLN
500.0000 [IU] | Freq: Once | INTRAVENOUS | Status: AC
  Administered 2020-07-23: 500 [IU]
  Filled 2020-07-23: qty 5

## 2020-07-23 MED ORDER — SODIUM CHLORIDE 0.9% FLUSH
10.0000 mL | Freq: Once | INTRAVENOUS | Status: AC
  Administered 2020-07-23: 10 mL
  Filled 2020-07-23: qty 10

## 2020-07-24 ENCOUNTER — Ambulatory Visit
Admission: RE | Admit: 2020-07-24 | Discharge: 2020-07-24 | Disposition: A | Discharge status: Home / Self Care | Payer: Medicare Other | Source: Ambulatory Visit | Attending: Radiation Oncology | Admitting: Radiation Oncology

## 2020-07-24 ENCOUNTER — Ambulatory Visit: Payer: Medicare Other

## 2020-07-24 DIAGNOSIS — Z510 Encounter for antineoplastic radiation therapy: Secondary | ICD-10-CM | POA: Diagnosis not present

## 2020-07-25 ENCOUNTER — Ambulatory Visit: Payer: Medicare Other

## 2020-07-25 ENCOUNTER — Ambulatory Visit
Admission: RE | Admit: 2020-07-25 | Discharge: 2020-07-25 | Disposition: A | Discharge status: Home / Self Care | Payer: Medicare Other | Source: Ambulatory Visit | Attending: Radiation Oncology | Admitting: Radiation Oncology

## 2020-07-25 ENCOUNTER — Other Ambulatory Visit: Payer: Self-pay

## 2020-07-25 DIAGNOSIS — Z510 Encounter for antineoplastic radiation therapy: Secondary | ICD-10-CM | POA: Diagnosis not present

## 2020-07-28 ENCOUNTER — Ambulatory Visit: Payer: Medicare Other

## 2020-07-28 ENCOUNTER — Other Ambulatory Visit: Payer: Self-pay

## 2020-07-28 ENCOUNTER — Ambulatory Visit
Admission: RE | Admit: 2020-07-28 | Discharge: 2020-07-28 | Disposition: A | Discharge status: Home / Self Care | Payer: Medicare Other | Source: Ambulatory Visit | Attending: Radiation Oncology | Admitting: Radiation Oncology

## 2020-07-28 DIAGNOSIS — Z510 Encounter for antineoplastic radiation therapy: Secondary | ICD-10-CM | POA: Diagnosis not present

## 2020-07-29 ENCOUNTER — Other Ambulatory Visit: Payer: Self-pay | Admitting: Hematology and Oncology

## 2020-07-29 ENCOUNTER — Other Ambulatory Visit (HOSPITAL_COMMUNITY): Payer: Self-pay

## 2020-07-29 ENCOUNTER — Ambulatory Visit: Payer: Medicare Other

## 2020-07-29 ENCOUNTER — Ambulatory Visit
Admission: RE | Admit: 2020-07-29 | Discharge: 2020-07-29 | Disposition: A | Discharge status: Home / Self Care | Payer: Medicare Other | Source: Ambulatory Visit | Attending: Radiation Oncology | Admitting: Radiation Oncology

## 2020-07-29 DIAGNOSIS — Z510 Encounter for antineoplastic radiation therapy: Secondary | ICD-10-CM | POA: Diagnosis not present

## 2020-07-29 DIAGNOSIS — C20 Malignant neoplasm of rectum: Secondary | ICD-10-CM

## 2020-07-30 ENCOUNTER — Ambulatory Visit: Payer: Medicare Other

## 2020-07-30 ENCOUNTER — Other Ambulatory Visit (HOSPITAL_COMMUNITY): Payer: Self-pay

## 2020-07-30 ENCOUNTER — Ambulatory Visit
Admission: RE | Admit: 2020-07-30 | Discharge: 2020-07-30 | Disposition: A | Discharge status: Home / Self Care | Payer: Medicare Other | Source: Ambulatory Visit | Attending: Radiation Oncology | Admitting: Radiation Oncology

## 2020-07-30 ENCOUNTER — Other Ambulatory Visit: Payer: Self-pay

## 2020-07-30 ENCOUNTER — Encounter: Payer: Self-pay | Admitting: Hematology and Oncology

## 2020-07-30 DIAGNOSIS — Z510 Encounter for antineoplastic radiation therapy: Secondary | ICD-10-CM | POA: Diagnosis not present

## 2020-07-30 MED ORDER — CAPECITABINE 500 MG PO TABS
1500.0000 mg | ORAL_TABLET | Freq: Two times a day (BID) | ORAL | 0 refills | Status: DC
  Filled 2020-07-30: qty 150, 25d supply, fill #0

## 2020-07-31 ENCOUNTER — Ambulatory Visit: Payer: Medicare Other

## 2020-07-31 ENCOUNTER — Inpatient Hospital Stay (HOSPITAL_BASED_OUTPATIENT_CLINIC_OR_DEPARTMENT_OTHER): Payer: Medicare Other | Admitting: Hematology and Oncology

## 2020-07-31 ENCOUNTER — Ambulatory Visit
Admission: RE | Admit: 2020-07-31 | Discharge: 2020-07-31 | Disposition: A | Discharge status: Home / Self Care | Payer: Medicare Other | Source: Ambulatory Visit | Attending: Radiation Oncology | Admitting: Radiation Oncology

## 2020-07-31 ENCOUNTER — Inpatient Hospital Stay: Payer: Medicare Other

## 2020-07-31 VITALS — BP 134/61 | HR 86 | Temp 96.5°F | Resp 18 | Ht 63.0 in | Wt 162.7 lb

## 2020-07-31 DIAGNOSIS — T451X5A Adverse effect of antineoplastic and immunosuppressive drugs, initial encounter: Secondary | ICD-10-CM

## 2020-07-31 DIAGNOSIS — D701 Agranulocytosis secondary to cancer chemotherapy: Secondary | ICD-10-CM | POA: Diagnosis not present

## 2020-07-31 DIAGNOSIS — Z510 Encounter for antineoplastic radiation therapy: Secondary | ICD-10-CM | POA: Diagnosis not present

## 2020-07-31 DIAGNOSIS — Z95828 Presence of other vascular implants and grafts: Secondary | ICD-10-CM | POA: Diagnosis not present

## 2020-07-31 DIAGNOSIS — L271 Localized skin eruption due to drugs and medicaments taken internally: Secondary | ICD-10-CM | POA: Diagnosis not present

## 2020-07-31 DIAGNOSIS — C20 Malignant neoplasm of rectum: Secondary | ICD-10-CM

## 2020-07-31 DIAGNOSIS — D696 Thrombocytopenia, unspecified: Secondary | ICD-10-CM

## 2020-07-31 LAB — CMP (CANCER CENTER ONLY)
ALT: 16 U/L (ref 0–44)
AST: 29 U/L (ref 15–41)
Albumin: 3.7 g/dL (ref 3.5–5.0)
Alkaline Phosphatase: 95 U/L (ref 38–126)
Anion gap: 10 (ref 5–15)
BUN: 12 mg/dL (ref 8–23)
CO2: 24 mmol/L (ref 22–32)
Calcium: 9.3 mg/dL (ref 8.9–10.3)
Chloride: 107 mmol/L (ref 98–111)
Creatinine: 0.78 mg/dL (ref 0.44–1.00)
GFR, Estimated: >60 mL/min (ref >60)
Glucose, Bld: 116 mg/dL — ABNORMAL HIGH (ref 70–99)
Potassium: 3.3 mmol/L — ABNORMAL LOW (ref 3.5–5.1)
Sodium: 141 mmol/L (ref 135–145)
Total Bilirubin: 1.1 mg/dL (ref 0.3–1.2)
Total Protein: 7.2 g/dL (ref 6.5–8.1)

## 2020-07-31 LAB — CBC WITH DIFFERENTIAL (CANCER CENTER ONLY)
Abs Immature Granulocytes: 0.01 10*3/uL (ref 0.00–0.07)
Basophils Absolute: 0 10*3/uL (ref 0.0–0.1)
Basophils Relative: 1 %
Eosinophils Absolute: 0.2 10*3/uL (ref 0.0–0.5)
Eosinophils Relative: 5 %
HCT: 33.9 % — ABNORMAL LOW (ref 36.0–46.0)
Hemoglobin: 11.4 g/dL — ABNORMAL LOW (ref 12.0–15.0)
Immature Granulocytes: 0 %
Lymphocytes Relative: 18 %
Lymphs Abs: 0.6 10*3/uL — ABNORMAL LOW (ref 0.7–4.0)
MCH: 32.2 pg (ref 26.0–34.0)
MCHC: 33.6 g/dL (ref 30.0–36.0)
MCV: 95.8 fL (ref 80.0–100.0)
Monocytes Absolute: 0.6 10*3/uL (ref 0.1–1.0)
Monocytes Relative: 17 %
Neutro Abs: 2 10*3/uL (ref 1.7–7.7)
Neutrophils Relative %: 59 %
Platelet Count: 137 10*3/uL — ABNORMAL LOW (ref 150–400)
RBC: 3.54 MIL/uL — ABNORMAL LOW (ref 3.87–5.11)
RDW: 16.1 % — ABNORMAL HIGH (ref 11.5–15.5)
WBC Count: 3.5 10*3/uL — ABNORMAL LOW (ref 4.0–10.5)
nRBC: 0 % (ref 0.0–0.2)

## 2020-07-31 LAB — LACTATE DEHYDROGENASE: LDH: 195 U/L — ABNORMAL HIGH (ref 98–192)

## 2020-07-31 LAB — MAGNESIUM: Magnesium: 1.9 mg/dL (ref 1.7–2.4)

## 2020-07-31 MED ORDER — HEPARIN SOD (PORK) LOCK FLUSH 100 UNIT/ML IV SOLN
500.0000 [IU] | Freq: Once | INTRAVENOUS | Status: AC
  Administered 2020-07-31: 500 [IU]
  Filled 2020-07-31: qty 5

## 2020-07-31 MED ORDER — SODIUM CHLORIDE 0.9% FLUSH
10.0000 mL | Freq: Once | INTRAVENOUS | Status: AC
  Administered 2020-07-31: 10 mL
  Filled 2020-07-31: qty 10

## 2020-07-31 NOTE — Progress Notes (Signed)
[name]   [phone]  PROGRESS NOTE  Patient Care Team: Patient, No Pcp Per (Inactive) as PCP - General (General Practice) [name], RN as Oncology Nurse Navigator [name], MD as Consulting Physician (Hematology and Oncology)  Hematological/Oncological History # Rectal Adenocarcainoma, Stage T3N1M0 1) 01/18/2020: colonoscopy due to bright red blood per rectum. A mass was found in the rectum 8 cm from the anal verge. Biopsy confirms moderately differentiated adenocarcinoma.  2) 01/29/2020: CT chest showed no evidence of metastatic disease. MRI showed a T2-T3 malignancy that appears to involve the internal sphincter and 2 mesorectal nodes 3) 02/07/2020: seen at [facility] by Oncologist Dr. Georgiann Cocker 4) 02/11/2020: seen at [facility] by Dr. [name] 5) 02/21/2020: establish care with Dr. [name]  6) 03/05/2020: Start of Neoadjuvant FOLFOX (anticipate 12-16 weeks of chemotherapy) 7) 03/19/2020: Cycle 2 Day 1 of Neoadjuvant FOLFOX  8) 04/02/2020: Cycle 3 Day 1 of Neoadjuvant FOLFOX  9) 04/16/2020: Cycle 4 Day 1 of Neoadjuvant FOLFOX  10) 04/30/2020: Cycle 5 HELD due to HFS 11) 05/14/2020: Cycle 5 Day 1 of Neoadjuvant FOLFOX (25% dose reduction of the 5-FU due to HFS) 12) 05/28/2020: Cycle 6 Day 1 of Neoadjuvant FOLFOX (25% dose reduction of the 5-FU due to HFS) 13) 06/12/2020: Cycle 7 Day 1 of Neoadjuvant FOLFOX (25% dose reduction of the 5-FU due to HFS) 14) 06/25/2020: Cycle 8 Day 1 of Neoadjuvant FOLFOX (25% dose reduction of the 5-FU due to HFS) 15) 07/14/2020: start of chemoradiation with capecitabine 1500mg  PO BID (825mg /m2)  Interval History:  Cheyenne Scott 65 y.o. female with medical history significant for rectal cancer who presents for a follow up visit. The patient's last visit was on 06/25/2020. In the interim since the last visit Cheyenne Scott has continued chemoradiation therapy.   On exam today Mrs.  Scott reports that she is having some tenderness in her fingertips.  There is still some dry skin in the joints of her fingers but overall no cracking or desquamation.  She notes that they feel "amazingly better".  She has been using the cream that we recommended previously.  She notes that she does have mild nausea but fortunately has not had any vomiting.  She reports that she is not having any diarrhea but that she "expect the diarrhea at any time".  She also notes that she has some burning and "yeast like symptoms" of her vagina.  Radiation oncology has made recommendations for ointment to help apply this.  She is also been recommended to try sits baths and baking soda.  Overall she reports that she feels quite well and is willing and able to proceed with treatment at this time. She denies any fevers, chills, sweats, vomiting or diarrhea.  Full 10 point ROS is listed below.   MEDICAL HISTORY:  Past Medical History:  Diagnosis Date   Acid reflux    Allergy    Rectal cancer ([place])     SURGICAL HISTORY: Past Surgical History:  Procedure Laterality Date   ANTERIOR AND POSTERIOR REPAIR N/A 01/20/2015   Procedure: ANTERIOR (CYSTOCELE) AND POSTERIOR REPAIR (RECTOCELE) WITH PERINEORRHAPHY AND UTEROSACRAL LIGAMENT  SUSPENSION ;  Surgeon: Brien Few, MD;  Location: [facility];  Service: Gynecology;  Laterality: N/A;   IR IMAGING GUIDED PORT INSERTION  02/26/2020   ROBOTIC ASSISTED TOTAL HYSTERECTOMY WITH SALPINGECTOMY Bilateral 01/20/2015   Procedure: ROBOTIC ASSISTED TOTAL HYSTERECTOMY WITH BILATERAL SALPINGECTOMY;  Surgeon: Brien Few, MD;  Location: [facility];  Service: Gynecology;  Laterality: Bilateral;   TUBAL LIGATION      SOCIAL HISTORY: Social History   Socioeconomic History   Marital status: Widowed    Spouse name: Not on file   Number of children: Not on file   Years of education: Not on file   Highest education level: Not on file  Occupational History   Not on file  Tobacco  Use   Smoking status: Never   Smokeless tobacco: Never  Substance and Sexual Activity   Alcohol use: No    Alcohol/week: 0.0 standard drinks   Drug use: No   Sexual activity: Not on file  Other Topics Concern   Not on file  Social History Narrative   Not on file   Social Determinants of Health   Financial Resource Strain: Not on file  Food Insecurity: Not on file  Transportation Needs: Not on file  Physical Activity: Not on file  Stress: Not on file  Social Connections: Not on file  Intimate Partner Violence: Not on file    FAMILY HISTORY: Family History  Problem Relation Age of Onset   Diabetes Mother    Heart disease Mother    Cancer Brother    Stroke Maternal Grandmother     ALLERGIES:  is allergic to penicillins.  MEDICATIONS:  Current Outpatient Medications  Medication Sig Dispense Refill   acetaminophen (TYLENOL) 325 MG tablet Take 650 mg by mouth every 6 (six) hours as needed.     ascorbic acid (VITAMIN C) 250 MG tablet Take by mouth.     capecitabine (XELODA) 500 MG tablet Take 3 tablets (1,500 mg total) by mouth 2 (two) times daily after a meal. Take only on days of radiation Monday through Friday. 150 tablet 0   Cholecalciferol (VITAMIN D3) 25 MCG (1000 UT) CAPS Take by mouth.     estradiol (ESTRACE) 0.1 MG/GM vaginal cream Place vaginally.     L-Lysine 500 MG TABS Take by mouth.     lidocaine-prilocaine (EMLA) cream Apply 1 application topically as needed. 30 g 0   Multiple Vitamin (MULTIVITAMIN WITH MINERALS) TABS tablet Take 1 tablet by mouth daily.     ondansetron (ZOFRAN) 8 MG tablet Take 1 tablet (8 mg total) by mouth every 8 (eight) hours as needed for nausea or vomiting. 30 tablet 0   potassium chloride SA (KLOR-CON) 20 MEQ tablet Take 1 tablet (20 mEq total) by mouth daily. 7 tablet 0   prochlorperazine (COMPAZINE) 10 MG tablet Take 1 tablet (10 mg total) by mouth every 6 (six) hours as needed for nausea or vomiting. 30 tablet 0   zinc gluconate 50  MG tablet Take by mouth.     No current facility-administered medications for this visit.    REVIEW OF SYSTEMS:   Constitutional: ( - ) fevers, ( - )  chills , ( - ) night sweats Eyes: ( - ) blurriness of vision, ( - ) double vision, ( - ) watery eyes Ears, nose, mouth, throat, and face: ( - ) mucositis, ( - ) sore throat Respiratory: ( - ) cough, ( - ) dyspnea, ( - ) wheezes Cardiovascular: ( - ) palpitation, ( - ) chest discomfort, ( - ) lower extremity swelling Gastrointestinal:  ( - ) nausea, ( - ) heartburn, ( - ) change in bowel habits Skin: ( - ) abnormal skin rashes Lymphatics: ( - ) new lymphadenopathy, ( - ) easy bruising Neurological: ( - ) numbness, ( - ) tingling, ( - )  new weaknesses Behavioral/Psych: ( - ) mood change, ( - ) new changes  All other systems were reviewed with the patient and are negative.  PHYSICAL EXAMINATION: ECOG PERFORMANCE STATUS: 1 - Symptomatic but completely ambulatory  Vitals:   07/31/20 1123  BP: 134/61  Pulse: 86  Resp: 18  Temp: (!) 96.5 F (35.8 C)  SpO2: 100%   Filed Weights   07/31/20 1123  Weight: 162 lb 11.2 oz (73.8 kg)    GENERAL: well appearing middle aged Caucasian female alert, no distress and comfortable SKIN: erythematous finger tips. Modest dry skin. No palmar involvement.  EYES: conjunctiva are pink and non-injected, sclera clear LUNGS: clear to auscultation and percussion with normal breathing effort HEART: regular rate & rhythm and no murmurs and no lower extremity edema Musculoskeletal: no cyanosis of digits and no clubbing  PSYCH: alert & oriented x 3, fluent speech NEURO: no focal motor/sensory deficits  LABORATORY DATA:  I have reviewed the data as listed CBC Latest Ref Rng & Units 07/31/2020 07/23/2020 07/16/2020  WBC 4.0 - 10.5 K/uL 3.5(L) 3.4(L) 3.5(L)  Hemoglobin 12.0 - 15.0 g/dL 11.4(L) 11.8(L) 12.4  Hematocrit 36.0 - 46.0 % 33.9(L) 35.1(L) 37.5  Platelets 150 - 400 K/uL 137(L) 135(L) 132(L)    CMP  Latest Ref Rng & Units 07/31/2020 07/23/2020 07/16/2020  Glucose 70 - 99 mg/dL 116(H) 98 113(H)  BUN 8 - 23 mg/dL 12 10 7(L)  Creatinine 0.44 - 1.00 mg/dL 0.78 0.73 0.76  Sodium 135 - 145 mmol/L 141 141 141  Potassium 3.5 - 5.1 mmol/L 3.3(L) 3.4(L) 3.2(L)  Chloride 98 - 111 mmol/L 107 106 105  CO2 22 - 32 mmol/L 24 25 24   Calcium 8.9 - 10.3 mg/dL 9.3 9.4 9.6  Total Protein 6.5 - 8.1 g/dL 7.2 7.3 7.3  Total Bilirubin 0.3 - 1.2 mg/dL 1.1 1.0 1.0  Alkaline Phos 38 - 126 U/L 95 115 123  AST 15 - 41 U/L 29 34 35  ALT 0 - 44 U/L 16 14 15      RADIOGRAPHIC STUDIES: No results found.  ASSESSMENT & PLAN Cheyenne Scott 65 y.o. female with medical history significant for rectal cancer who presents for a follow up visit.  The current plan to treat the patient with FOLFOX chemotherapy every 2 weeks x 8 cycles, for total of 16 weeks for the treatment.  Then we will start chemoradiation with capecitabine 825 mg per metered squared twice daily on days of radiation, typically for about 28 fractions.  Mrs. Liew established with Dr. Lisbeth Renshaw who is performing the radiation component of her treatment.  Once this is complete we will reevaluate with repeat imaging in proceed to surgery if feasible.  The patient and her daughter-in-law voiced their understanding of this plan moving forward.   Previously we discussed the FOLFOX chemotherapy treatment and the expected side effects including nausea, vomiting, diarrhea, neutropenia, nerve pain, and hand-foot syndrome.  Discussed that these are the most common and dangerous side effects, but is not limited to these alone.  The patient was agreeable to proceeding with FOLFOX chemotherapy at this time.  We will plan for chemotherapy education as well as port placement as soon as possible.   # Rectal Adenocarcainoma, Stage T3N1M0 -- Findings consistent with a stage III T3 N1 M0 rectal adenocarcinoma.  Treatment of choice would be total neoadjuvant chemotherapy/  chemoradiation followed by surgical resection. -- Started Cycle 1 Day 1 of FOLFOX chemotherapy on 03/05/2020. She comopleted 8 cycles total.  -- initial pretreatment  CEA found to be within normal limits, not likely to be of value moving forward.  --plan to start Xeloda 1500 mg PO BID (825mg /m2) on 07/16/2020. Radiation therapy started on 07/14/2020. Xeloda to be taken on radiation days only.  --monitor CBC, CMP, and magnesium with each cycle. --once chemoradiation is complete will perform repeat scans (typically 8 weeks after completion of chemoradiation) and connect back with surgical service.  --RTC in 2 weeks for continued monitoring on Xeloda   #Hand Foot Syndrome, Grade II --developed after Cycle 3 of FOLFOX --encourage use of moisturizer -continue to monitor    #Supportive Care --chemotherapy education completed --port in place --zofran 8mg  q8H PRN and compazine 10mg  PO q6H for nausea -- EMLA cream for port -- no pain medication required at this time.    No orders of the defined types were placed in this encounter.  All questions were answered. The patient knows to call the clinic with any problems, questions or concerns.  A total of more than 30 minutes were spent on this encounter and over half of that time was spent on counseling and coordination of care as outlined above.   Ledell Peoples, MD Department of Hematology/Oncology [place] at [facility] Phone: [phone] Pager: [phone] Email: [email or web address]  07/31/2020 2:19 PM

## 2020-08-01 ENCOUNTER — Ambulatory Visit
Admission: RE | Admit: 2020-08-01 | Discharge: 2020-08-01 | Disposition: A | Discharge status: Home / Self Care | Payer: Medicare Other | Source: Ambulatory Visit | Attending: Radiation Oncology | Admitting: Radiation Oncology

## 2020-08-01 ENCOUNTER — Ambulatory Visit: Payer: Medicare Other

## 2020-08-01 ENCOUNTER — Other Ambulatory Visit: Payer: Self-pay

## 2020-08-01 DIAGNOSIS — Z510 Encounter for antineoplastic radiation therapy: Secondary | ICD-10-CM | POA: Diagnosis not present

## 2020-08-04 ENCOUNTER — Other Ambulatory Visit: Payer: Self-pay

## 2020-08-04 ENCOUNTER — Ambulatory Visit
Admission: RE | Admit: 2020-08-04 | Discharge: 2020-08-04 | Disposition: A | Discharge status: Home / Self Care | Payer: Medicare Other | Source: Ambulatory Visit | Attending: Radiation Oncology | Admitting: Radiation Oncology

## 2020-08-04 ENCOUNTER — Ambulatory Visit: Payer: Medicare Other

## 2020-08-04 DIAGNOSIS — Z510 Encounter for antineoplastic radiation therapy: Secondary | ICD-10-CM | POA: Diagnosis not present

## 2020-08-05 ENCOUNTER — Other Ambulatory Visit: Payer: Self-pay | Admitting: Radiation Oncology

## 2020-08-05 ENCOUNTER — Ambulatory Visit: Payer: Medicare Other

## 2020-08-05 ENCOUNTER — Ambulatory Visit
Admission: RE | Admit: 2020-08-05 | Discharge: 2020-08-05 | Disposition: A | Discharge status: Home / Self Care | Payer: Medicare Other | Source: Ambulatory Visit | Attending: Radiation Oncology | Admitting: Radiation Oncology

## 2020-08-05 ENCOUNTER — Other Ambulatory Visit: Payer: Self-pay | Admitting: *Deleted

## 2020-08-05 ENCOUNTER — Other Ambulatory Visit: Payer: Self-pay

## 2020-08-05 ENCOUNTER — Encounter: Payer: Self-pay | Admitting: Radiation Oncology

## 2020-08-05 DIAGNOSIS — Z510 Encounter for antineoplastic radiation therapy: Secondary | ICD-10-CM | POA: Diagnosis not present

## 2020-08-05 DIAGNOSIS — C20 Malignant neoplasm of rectum: Secondary | ICD-10-CM

## 2020-08-05 MED ORDER — DIPHENOXYLATE-ATROPINE 2.5-0.025 MG PO TABS: 2.0000 | ORAL_TABLET | Freq: Four times a day (QID) | ORAL | 1 refills | Status: DC | PRN

## 2020-08-05 NOTE — Progress Notes (Signed)
The patient was seen for a work in assessment. She is currently receiving chemoRT for rectal carcinoma and has received 17/28 fractions planned. She's had trouble with diarrhea and has been using imodium up to 6-8 tabs/24 hours without relief. Even with this medication she has had loose stools that are somewhat liquidy but small amounts of formed stool. She denies rectal bleeding or visible mucous. She is drinking 64 oz of water or more a day. She has urinary frequency as a result as well. I reviewed her recent labs from last week and there does not appear to be any concern for dehydration which is confirmed by orthostatic vitals that are not concerning either. We discussed starting Lomotil and she will let us know how she's doing with this, but if she develops mucous per rectum, I would recommend considering anusol suppositories. She is in agreement with this plan.

## 2020-08-06 ENCOUNTER — Ambulatory Visit
Admission: RE | Admit: 2020-08-06 | Discharge: 2020-08-06 | Disposition: A | Discharge status: Home / Self Care | Payer: Medicare Other | Source: Ambulatory Visit | Attending: Radiation Oncology | Admitting: Radiation Oncology

## 2020-08-06 ENCOUNTER — Inpatient Hospital Stay: Payer: Medicare Other

## 2020-08-06 ENCOUNTER — Telehealth: Payer: Self-pay | Admitting: Hematology and Oncology

## 2020-08-06 ENCOUNTER — Ambulatory Visit: Payer: Medicare Other

## 2020-08-06 DIAGNOSIS — C20 Malignant neoplasm of rectum: Secondary | ICD-10-CM

## 2020-08-06 DIAGNOSIS — Z510 Encounter for antineoplastic radiation therapy: Secondary | ICD-10-CM | POA: Diagnosis not present

## 2020-08-06 DIAGNOSIS — Z95828 Presence of other vascular implants and grafts: Secondary | ICD-10-CM

## 2020-08-06 LAB — CMP (CANCER CENTER ONLY)
ALT: 15 U/L (ref 0–44)
AST: 27 U/L (ref 15–41)
Albumin: 3.4 g/dL — ABNORMAL LOW (ref 3.5–5.0)
Alkaline Phosphatase: 99 U/L (ref 38–126)
Anion gap: 10 (ref 5–15)
BUN: 8 mg/dL (ref 8–23)
CO2: 26 mmol/L (ref 22–32)
Calcium: 8.9 mg/dL (ref 8.9–10.3)
Chloride: 101 mmol/L (ref 98–111)
Creatinine: 0.74 mg/dL (ref 0.44–1.00)
GFR, Estimated: >60 mL/min (ref >60)
Glucose, Bld: 107 mg/dL — ABNORMAL HIGH (ref 70–99)
Potassium: 3 mmol/L — ABNORMAL LOW (ref 3.5–5.1)
Sodium: 137 mmol/L (ref 135–145)
Total Bilirubin: 1 mg/dL (ref 0.3–1.2)
Total Protein: 6.9 g/dL (ref 6.5–8.1)

## 2020-08-06 LAB — CBC WITH DIFFERENTIAL (CANCER CENTER ONLY)
Abs Immature Granulocytes: 0.01 10*3/uL (ref 0.00–0.07)
Basophils Absolute: 0 10*3/uL (ref 0.0–0.1)
Basophils Relative: 1 %
Eosinophils Absolute: 0.2 10*3/uL (ref 0.0–0.5)
Eosinophils Relative: 6 %
HCT: 30.6 % — ABNORMAL LOW (ref 36.0–46.0)
Hemoglobin: 10.5 g/dL — ABNORMAL LOW (ref 12.0–15.0)
Immature Granulocytes: 0 %
Lymphocytes Relative: 10 %
Lymphs Abs: 0.4 10*3/uL — ABNORMAL LOW (ref 0.7–4.0)
MCH: 32.5 pg (ref 26.0–34.0)
MCHC: 34.3 g/dL (ref 30.0–36.0)
MCV: 94.7 fL (ref 80.0–100.0)
Monocytes Absolute: 0.7 10*3/uL (ref 0.1–1.0)
Monocytes Relative: 17 %
Neutro Abs: 2.7 10*3/uL (ref 1.7–7.7)
Neutrophils Relative %: 66 %
Platelet Count: 142 10*3/uL — ABNORMAL LOW (ref 150–400)
RBC: 3.23 MIL/uL — ABNORMAL LOW (ref 3.87–5.11)
RDW: 16.1 % — ABNORMAL HIGH (ref 11.5–15.5)
WBC Count: 4 10*3/uL (ref 4.0–10.5)
nRBC: 0 % (ref 0.0–0.2)

## 2020-08-06 LAB — MAGNESIUM: Magnesium: 1.9 mg/dL (ref 1.7–2.4)

## 2020-08-06 LAB — LACTATE DEHYDROGENASE: LDH: 250 U/L — ABNORMAL HIGH (ref 98–192)

## 2020-08-06 MED ORDER — SODIUM CHLORIDE 0.9% FLUSH
10.0000 mL | Freq: Once | INTRAVENOUS | Status: AC
  Administered 2020-08-06: 10 mL
  Filled 2020-08-06: qty 10

## 2020-08-06 MED ORDER — HEPARIN SOD (PORK) LOCK FLUSH 100 UNIT/ML IV SOLN
500.0000 [IU] | Freq: Once | INTRAVENOUS | Status: AC
  Administered 2020-08-06: 500 [IU]
  Filled 2020-08-06: qty 5

## 2020-08-06 NOTE — Telephone Encounter (Signed)
R/s per 6/08 , pt aware.

## 2020-08-07 ENCOUNTER — Ambulatory Visit: Payer: Medicare Other

## 2020-08-07 ENCOUNTER — Other Ambulatory Visit: Payer: Self-pay

## 2020-08-07 ENCOUNTER — Other Ambulatory Visit (HOSPITAL_COMMUNITY): Payer: Self-pay

## 2020-08-07 ENCOUNTER — Ambulatory Visit
Admission: RE | Admit: 2020-08-07 | Discharge: 2020-08-07 | Disposition: A | Discharge status: Home / Self Care | Payer: Medicare Other | Source: Ambulatory Visit | Attending: Radiation Oncology | Admitting: Radiation Oncology

## 2020-08-07 ENCOUNTER — Telehealth: Payer: Self-pay | Admitting: Hematology and Oncology

## 2020-08-07 DIAGNOSIS — Z510 Encounter for antineoplastic radiation therapy: Secondary | ICD-10-CM | POA: Diagnosis not present

## 2020-08-07 NOTE — Telephone Encounter (Signed)
Sch per 6/29 los, pt aware

## 2020-08-08 ENCOUNTER — Ambulatory Visit
Admission: RE | Admit: 2020-08-08 | Discharge: 2020-08-08 | Disposition: A | Discharge status: Home / Self Care | Payer: Medicare Other | Source: Ambulatory Visit | Attending: Radiation Oncology | Admitting: Radiation Oncology

## 2020-08-08 ENCOUNTER — Ambulatory Visit: Payer: Medicare Other

## 2020-08-08 DIAGNOSIS — Z95828 Presence of other vascular implants and grafts: Secondary | ICD-10-CM | POA: Diagnosis present

## 2020-08-08 DIAGNOSIS — Z510 Encounter for antineoplastic radiation therapy: Secondary | ICD-10-CM | POA: Insufficient documentation

## 2020-08-08 DIAGNOSIS — C20 Malignant neoplasm of rectum: Secondary | ICD-10-CM | POA: Insufficient documentation

## 2020-08-12 ENCOUNTER — Other Ambulatory Visit: Payer: Self-pay

## 2020-08-12 ENCOUNTER — Ambulatory Visit: Payer: Medicare Other

## 2020-08-12 ENCOUNTER — Inpatient Hospital Stay: Payer: Medicare Other | Admitting: Hematology and Oncology

## 2020-08-12 ENCOUNTER — Ambulatory Visit
Admission: RE | Admit: 2020-08-12 | Discharge: 2020-08-12 | Disposition: A | Discharge status: Home / Self Care | Payer: Medicare Other | Source: Ambulatory Visit | Attending: Radiation Oncology | Admitting: Radiation Oncology

## 2020-08-12 ENCOUNTER — Inpatient Hospital Stay: Payer: Medicare Other

## 2020-08-12 DIAGNOSIS — Z510 Encounter for antineoplastic radiation therapy: Secondary | ICD-10-CM | POA: Diagnosis not present

## 2020-08-13 ENCOUNTER — Inpatient Hospital Stay (HOSPITAL_BASED_OUTPATIENT_CLINIC_OR_DEPARTMENT_OTHER): Payer: Medicare Other | Admitting: Hematology and Oncology

## 2020-08-13 ENCOUNTER — Ambulatory Visit: Payer: Medicare Other

## 2020-08-13 ENCOUNTER — Ambulatory Visit
Admission: RE | Admit: 2020-08-13 | Discharge: 2020-08-13 | Disposition: A | Discharge status: Home / Self Care | Payer: Medicare Other | Source: Ambulatory Visit | Attending: Radiation Oncology | Admitting: Radiation Oncology

## 2020-08-13 ENCOUNTER — Inpatient Hospital Stay: Payer: Medicare Other

## 2020-08-13 ENCOUNTER — Encounter: Payer: Self-pay | Admitting: Hematology and Oncology

## 2020-08-13 ENCOUNTER — Other Ambulatory Visit: Payer: Self-pay | Admitting: Hematology and Oncology

## 2020-08-13 DIAGNOSIS — C20 Malignant neoplasm of rectum: Secondary | ICD-10-CM | POA: Insufficient documentation

## 2020-08-13 DIAGNOSIS — Z95828 Presence of other vascular implants and grafts: Secondary | ICD-10-CM

## 2020-08-13 DIAGNOSIS — T451X5A Adverse effect of antineoplastic and immunosuppressive drugs, initial encounter: Secondary | ICD-10-CM

## 2020-08-13 DIAGNOSIS — Z79899 Other long term (current) drug therapy: Secondary | ICD-10-CM | POA: Insufficient documentation

## 2020-08-13 DIAGNOSIS — Z9221 Personal history of antineoplastic chemotherapy: Secondary | ICD-10-CM | POA: Insufficient documentation

## 2020-08-13 DIAGNOSIS — R197 Diarrhea, unspecified: Secondary | ICD-10-CM | POA: Insufficient documentation

## 2020-08-13 DIAGNOSIS — Z510 Encounter for antineoplastic radiation therapy: Secondary | ICD-10-CM | POA: Diagnosis not present

## 2020-08-13 DIAGNOSIS — L271 Localized skin eruption due to drugs and medicaments taken internally: Secondary | ICD-10-CM

## 2020-08-13 DIAGNOSIS — Z923 Personal history of irradiation: Secondary | ICD-10-CM | POA: Insufficient documentation

## 2020-08-13 DIAGNOSIS — D701 Agranulocytosis secondary to cancer chemotherapy: Secondary | ICD-10-CM | POA: Diagnosis not present

## 2020-08-13 LAB — CMP (CANCER CENTER ONLY)
ALT: 16 U/L (ref 0–44)
AST: 31 U/L (ref 15–41)
Albumin: 3.5 g/dL (ref 3.5–5.0)
Alkaline Phosphatase: 85 U/L (ref 38–126)
Anion gap: 9 (ref 5–15)
BUN: 7 mg/dL — ABNORMAL LOW (ref 8–23)
CO2: 28 mmol/L (ref 22–32)
Calcium: 9 mg/dL (ref 8.9–10.3)
Chloride: 106 mmol/L (ref 98–111)
Creatinine: 0.8 mg/dL (ref 0.44–1.00)
GFR, Estimated: >60 mL/min (ref >60)
Glucose, Bld: 128 mg/dL — ABNORMAL HIGH (ref 70–99)
Potassium: 3 mmol/L — ABNORMAL LOW (ref 3.5–5.1)
Sodium: 143 mmol/L (ref 135–145)
Total Bilirubin: 1 mg/dL (ref 0.3–1.2)
Total Protein: 6.9 g/dL (ref 6.5–8.1)

## 2020-08-13 LAB — CBC WITH DIFFERENTIAL (CANCER CENTER ONLY)
Abs Immature Granulocytes: 0.02 10*3/uL (ref 0.00–0.07)
Basophils Absolute: 0.1 10*3/uL (ref 0.0–0.1)
Basophils Relative: 1 %
Eosinophils Absolute: 0.4 10*3/uL (ref 0.0–0.5)
Eosinophils Relative: 8 %
HCT: 30.6 % — ABNORMAL LOW (ref 36.0–46.0)
Hemoglobin: 10.6 g/dL — ABNORMAL LOW (ref 12.0–15.0)
Immature Granulocytes: 1 %
Lymphocytes Relative: 10 %
Lymphs Abs: 0.4 10*3/uL — ABNORMAL LOW (ref 0.7–4.0)
MCH: 33.3 pg (ref 26.0–34.0)
MCHC: 34.6 g/dL (ref 30.0–36.0)
MCV: 96.2 fL (ref 80.0–100.0)
Monocytes Absolute: 0.7 10*3/uL (ref 0.1–1.0)
Monocytes Relative: 16 %
Neutro Abs: 2.7 10*3/uL (ref 1.7–7.7)
Neutrophils Relative %: 64 %
Platelet Count: 162 10*3/uL (ref 150–400)
RBC: 3.18 MIL/uL — ABNORMAL LOW (ref 3.87–5.11)
RDW: 17.1 % — ABNORMAL HIGH (ref 11.5–15.5)
WBC Count: 4.3 10*3/uL (ref 4.0–10.5)
nRBC: 0 % (ref 0.0–0.2)

## 2020-08-13 MED ORDER — SODIUM CHLORIDE 0.9% FLUSH
10.0000 mL | Freq: Once | INTRAVENOUS | Status: AC
  Administered 2020-08-13: 10 mL
  Filled 2020-08-13: qty 10

## 2020-08-13 MED ORDER — HEPARIN SOD (PORK) LOCK FLUSH 100 UNIT/ML IV SOLN
500.0000 [IU] | Freq: Once | INTRAVENOUS | Status: AC
  Administered 2020-08-13: 500 [IU]
  Filled 2020-08-13: qty 5

## 2020-08-13 NOTE — Progress Notes (Signed)
[place] [phone]   [phone]  PROGRESS NOTE  Patient Care Team: Patient, No Pcp Per (Inactive) as PCP - General (General Practice) [name], RN (Inactive) as Oncology Nurse Navigator [name], MD as Consulting Physician (Hematology and Oncology)  Hematological/Oncological History # Rectal Adenocarcainoma, Stage T3N1M0 1) 01/18/2020: colonoscopy due to bright red blood per rectum. A mass was found in the rectum 8 cm from the anal verge. Biopsy confirms moderately differentiated adenocarcinoma.  2) 01/29/2020: CT chest showed no evidence of metastatic disease. MRI showed a T2-T3 malignancy that appears to involve the internal sphincter and 2 mesorectal nodes 3) 02/07/2020: seen at [facility] by Oncologist Dr. Georgiann Cocker 4) 02/11/2020: seen at [facility] by Dr. [name] 5) 02/21/2020: establish care with Dr. [name]  6) 03/05/2020: Start of Neoadjuvant FOLFOX (anticipate 12-16 weeks of chemotherapy) 7) 03/19/2020: Cycle 2 Day 1 of Neoadjuvant FOLFOX  8) 04/02/2020: Cycle 3 Day 1 of Neoadjuvant FOLFOX  9) 04/16/2020: Cycle 4 Day 1 of Neoadjuvant FOLFOX  10) 04/30/2020: Cycle 5 HELD due to HFS 11) 05/14/2020: Cycle 5 Day 1 of Neoadjuvant FOLFOX (25% dose reduction of the 5-FU due to HFS) 12) 05/28/2020: Cycle 6 Day 1 of Neoadjuvant FOLFOX (25% dose reduction of the 5-FU due to HFS) 13) 06/12/2020: Cycle 7 Day 1 of Neoadjuvant FOLFOX (25% dose reduction of the 5-FU due to HFS) 14) 06/25/2020: Cycle 8 Day 1 of Neoadjuvant FOLFOX (25% dose reduction of the 5-FU due to HFS) 15) 07/14/2020: start of chemoradiation with capecitabine 1500mg  PO BID (825mg /m2). Last day 08/21/2020  Interval History:  Cheyenne Scott 65 y.o. female with medical history significant for rectal cancer who presents for a follow up visit. The patient's last visit was on 07/31/2020. In the interim since the last visit Cheyenne Scott has continued chemoradiation therapy.    On exam today Cheyenne Scott reports fatigue, "sitting and nodding like an 65 year old" but excited to be done with chemoradiation on 08/21/2020. BRAT diet to help with diarrhea from radiation therapy. Diarrhea not as bad with that diet. Lomotil has been helping with diarrhea as well. Diarrhea x 3-4 times per day.  She reports her hands have no peeling with some mild tenderness at the tip. Ridges with the nails. Cream on feet too, but no overt issues. Overall she reports that she feels quite well and is willing and able to proceed with treatment at this time. She denies any fevers, chills, sweats, vomiting.  Full 10 point ROS is listed below.   MEDICAL HISTORY:  Past Medical History:  Diagnosis Date   Acid reflux    Allergy    Rectal cancer ([place])     SURGICAL HISTORY: Past Surgical History:  Procedure Laterality Date   ANTERIOR AND POSTERIOR REPAIR N/A 01/20/2015   Procedure: ANTERIOR (CYSTOCELE) AND POSTERIOR REPAIR (RECTOCELE) WITH PERINEORRHAPHY AND UTEROSACRAL LIGAMENT  SUSPENSION ;  Surgeon: Brien Few, MD;  Location: [facility];  Service: Gynecology;  Laterality: N/A;   IR IMAGING GUIDED PORT INSERTION  02/26/2020   ROBOTIC ASSISTED TOTAL HYSTERECTOMY WITH SALPINGECTOMY Bilateral 01/20/2015   Procedure: ROBOTIC ASSISTED TOTAL HYSTERECTOMY WITH BILATERAL SALPINGECTOMY;  Surgeon: Brien Few, MD;  Location: [facility];  Service: Gynecology;  Laterality: Bilateral;   TUBAL LIGATION      SOCIAL HISTORY: Social History   Socioeconomic History   Marital status: Widowed    Spouse name: Not on file   Number of children: Not on file   Years of  education: Not on file   Highest education level: Not on file  Occupational History   Not on file  Tobacco Use   Smoking status: Never   Smokeless tobacco: Never  Substance and Sexual Activity   Alcohol use: No    Alcohol/week: 0.0 standard drinks   Drug use: No   Sexual activity: Not on file  Other Topics Concern   Not on file  Social  History Narrative   Not on file   Social Determinants of Health   Financial Resource Strain: Not on file  Food Insecurity: Not on file  Transportation Needs: Not on file  Physical Activity: Not on file  Stress: Not on file  Social Connections: Not on file  Intimate Partner Violence: Not on file    FAMILY HISTORY: Family History  Problem Relation Age of Onset   Diabetes Mother    Heart disease Mother    Cancer Brother    Stroke Maternal Grandmother     ALLERGIES:  is allergic to penicillins.  MEDICATIONS:  Current Outpatient Medications  Medication Sig Dispense Refill   acetaminophen (TYLENOL) 325 MG tablet Take 650 mg by mouth every 6 (six) hours as needed.     ascorbic acid (VITAMIN C) 250 MG tablet Take by mouth.     capecitabine (XELODA) 500 MG tablet Take 3 tablets (1,500 mg total) by mouth 2 (two) times daily after a meal. Take only on days of radiation Monday through Friday. 150 tablet 0   Cholecalciferol (VITAMIN D3) 25 MCG (1000 UT) CAPS Take by mouth.     diphenoxylate-atropine (LOMOTIL) 2.5-0.025 MG tablet Take 2 tablets by mouth 4 (four) times daily as needed for diarrhea or loose stools. 120 tablet 1   estradiol (ESTRACE) 0.1 MG/GM vaginal cream Place vaginally.     L-Lysine 500 MG TABS Take by mouth.     lidocaine-prilocaine (EMLA) cream Apply 1 application topically as needed. 30 g 0   Multiple Vitamin (MULTIVITAMIN WITH MINERALS) TABS tablet Take 1 tablet by mouth daily.     ondansetron (ZOFRAN) 8 MG tablet Take 1 tablet (8 mg total) by mouth every 8 (eight) hours as needed for nausea or vomiting. 30 tablet 0   potassium chloride SA (KLOR-CON) 20 MEQ tablet Take 1 tablet (20 mEq total) by mouth daily. 7 tablet 0   prochlorperazine (COMPAZINE) 10 MG tablet Take 1 tablet (10 mg total) by mouth every 6 (six) hours as needed for nausea or vomiting. 30 tablet 0   zinc gluconate 50 MG tablet Take by mouth.     No current facility-administered medications for this  visit.    REVIEW OF SYSTEMS:   Constitutional: ( - ) fevers, ( - )  chills , ( - ) night sweats Eyes: ( - ) blurriness of vision, ( - ) double vision, ( - ) watery eyes Ears, nose, mouth, throat, and face: ( - ) mucositis, ( - ) sore throat Respiratory: ( - ) cough, ( - ) dyspnea, ( - ) wheezes Cardiovascular: ( - ) palpitation, ( - ) chest discomfort, ( - ) lower extremity swelling Gastrointestinal:  ( - ) nausea, ( - ) heartburn, ( - ) change in bowel habits Skin: ( - ) abnormal skin rashes Lymphatics: ( - ) new lymphadenopathy, ( - ) easy bruising Neurological: ( - ) numbness, ( - ) tingling, ( - ) new weaknesses Behavioral/Psych: ( - ) mood change, ( - ) new changes  All other systems were reviewed with  the patient and are negative.  PHYSICAL EXAMINATION: ECOG PERFORMANCE STATUS: 1 - Symptomatic but completely ambulatory  There were no vitals filed for this visit.  There were no vitals filed for this visit.   Telephone Visit: no vitals or physical exam.   LABORATORY DATA:  I have reviewed the data as listed CBC Latest Ref Rng & Units 08/13/2020 08/06/2020 07/31/2020  WBC 4.0 - 10.5 K/uL 4.3 4.0 3.5(L)  Hemoglobin 12.0 - 15.0 g/dL 10.6(L) 10.5(L) 11.4(L)  Hematocrit 36.0 - 46.0 % 30.6(L) 30.6(L) 33.9(L)  Platelets 150 - 400 K/uL 162 142(L) 137(L)    CMP Latest Ref Rng & Units 08/13/2020 08/06/2020 07/31/2020  Glucose 70 - 99 mg/dL 128(H) 107(H) 116(H)  BUN 8 - 23 mg/dL 7(L) 8 12  Creatinine 0.44 - 1.00 mg/dL 0.80 0.74 0.78  Sodium 135 - 145 mmol/L 143 137 141  Potassium 3.5 - 5.1 mmol/L 3.0(L) 3.0(L) 3.3(L)  Chloride 98 - 111 mmol/L 106 101 107  CO2 22 - 32 mmol/L 28 26 24   Calcium 8.9 - 10.3 mg/dL 9.0 8.9 9.3  Total Protein 6.5 - 8.1 g/dL 6.9 6.9 7.2  Total Bilirubin 0.3 - 1.2 mg/dL 1.0 1.0 1.1  Alkaline Phos 38 - 126 U/L 85 99 95  AST 15 - 41 U/L 31 27 29   ALT 0 - 44 U/L 16 15 16      RADIOGRAPHIC STUDIES: No results found.  ASSESSMENT & PLAN Cheyenne Scott 65 y.o.  female with medical history significant for rectal cancer who presents for a follow up visit.  The current plan to treat the patient with FOLFOX chemotherapy every 2 weeks x 8 cycles, for total of 16 weeks for the treatment.  Then we will start chemoradiation with capecitabine 825 mg per metered squared twice daily on days of radiation, typically for about 28 fractions.  Cheyenne Scott established with Dr. Lisbeth Renshaw who is performing the radiation component of her treatment.  Once this is complete we will reevaluate with repeat imaging in proceed to surgery if feasible.  The patient and her daughter-in-law voiced their understanding of this plan moving forward.   Previously we discussed the FOLFOX chemotherapy treatment and the expected side effects including nausea, vomiting, diarrhea, neutropenia, nerve pain, and hand-foot syndrome.  Discussed that these are the most common and dangerous side effects, but is not limited to these alone.  The patient was agreeable to proceeding with FOLFOX chemotherapy at this time.  We will plan for chemotherapy education as well as port placement as soon as possible.   # Rectal Adenocarcainoma, Stage T3N1M0 -- Findings consistent with a stage III T3 N1 M0 rectal adenocarcinoma.  Treatment of choice would be total neoadjuvant chemotherapy/ chemoradiation followed by surgical resection. -- Started Cycle 1 Day 1 of FOLFOX chemotherapy on 03/05/2020. She completed 8 cycles total.  -- initial pretreatment CEA found to be within normal limits, not likely to be of value moving forward.  --plan to start Xeloda 1500 mg PO BID (825mg /m2) on 07/16/2020. Radiation therapy started on 07/14/2020. Xeloda to be taken on radiation days only.  End date of chemoradiation 08/21/2020.  --monitor CBC, CMP, and magnesium with each cycle. --once chemoradiation is complete will perform repeat scans (typically 8 weeks after completion of chemoradiation) and connect back with surgical service.  --RTC in 2  weeks for continued monitoring on Xeloda   #Hand Foot Syndrome, Grade II --developed after Cycle 3 of FOLFOX --encourage use of moisturizer -continue to monitor    #Supportive Care --chemotherapy  education completed --port in place --zofran 8mg  q8H PRN and compazine 10mg  PO q6H for nausea -- EMLA cream for port -- no pain medication required at this time.    No orders of the defined types were placed in this encounter.  All questions were answered. The patient knows to call the clinic with any problems, questions or concerns.  A total of more than 20 minutes were spent on this encounter and over half of that time was spent on counseling and coordination of care as outlined above.   Ledell Peoples, MD Department of Hematology/Oncology [place] at [facility] Phone: [phone] Pager: [phone] Email: [email or web address]  08/13/2020 3:06 PM

## 2020-08-14 ENCOUNTER — Ambulatory Visit: Payer: Medicare Other

## 2020-08-14 ENCOUNTER — Other Ambulatory Visit: Payer: Self-pay

## 2020-08-14 ENCOUNTER — Ambulatory Visit
Admission: RE | Admit: 2020-08-14 | Discharge: 2020-08-14 | Disposition: A | Discharge status: Home / Self Care | Payer: Medicare Other | Source: Ambulatory Visit | Attending: Radiation Oncology | Admitting: Radiation Oncology

## 2020-08-14 DIAGNOSIS — Z510 Encounter for antineoplastic radiation therapy: Secondary | ICD-10-CM | POA: Diagnosis not present

## 2020-08-15 ENCOUNTER — Ambulatory Visit: Payer: Medicare Other

## 2020-08-15 ENCOUNTER — Other Ambulatory Visit (HOSPITAL_COMMUNITY): Payer: Self-pay

## 2020-08-16 DIAGNOSIS — Z510 Encounter for antineoplastic radiation therapy: Secondary | ICD-10-CM | POA: Diagnosis not present

## 2020-08-18 ENCOUNTER — Other Ambulatory Visit: Payer: Self-pay

## 2020-08-18 ENCOUNTER — Ambulatory Visit: Payer: Medicare Other

## 2020-08-18 ENCOUNTER — Ambulatory Visit
Admission: RE | Admit: 2020-08-18 | Discharge: 2020-08-18 | Disposition: A | Discharge status: Home / Self Care | Payer: Medicare Other | Source: Ambulatory Visit | Attending: Radiation Oncology | Admitting: Radiation Oncology

## 2020-08-18 DIAGNOSIS — Z510 Encounter for antineoplastic radiation therapy: Secondary | ICD-10-CM | POA: Diagnosis not present

## 2020-08-19 ENCOUNTER — Ambulatory Visit: Payer: Medicare Other

## 2020-08-19 ENCOUNTER — Ambulatory Visit: Payer: Medicare Other | Admitting: Radiation Oncology

## 2020-08-19 ENCOUNTER — Ambulatory Visit
Admission: RE | Admit: 2020-08-19 | Discharge: 2020-08-19 | Disposition: A | Discharge status: Home / Self Care | Payer: Medicare Other | Source: Ambulatory Visit | Attending: Radiation Oncology | Admitting: Radiation Oncology

## 2020-08-19 DIAGNOSIS — Z510 Encounter for antineoplastic radiation therapy: Secondary | ICD-10-CM | POA: Diagnosis not present

## 2020-08-20 ENCOUNTER — Ambulatory Visit: Payer: Medicare Other

## 2020-08-20 ENCOUNTER — Other Ambulatory Visit: Payer: Self-pay

## 2020-08-20 ENCOUNTER — Inpatient Hospital Stay: Payer: Medicare Other

## 2020-08-20 ENCOUNTER — Ambulatory Visit: Admission: RE | Admit: 2020-08-20 | Payer: Medicare Other | Source: Ambulatory Visit | Admitting: Radiation Oncology

## 2020-08-20 DIAGNOSIS — Z95828 Presence of other vascular implants and grafts: Secondary | ICD-10-CM

## 2020-08-20 DIAGNOSIS — C20 Malignant neoplasm of rectum: Secondary | ICD-10-CM

## 2020-08-20 DIAGNOSIS — Z510 Encounter for antineoplastic radiation therapy: Secondary | ICD-10-CM | POA: Diagnosis not present

## 2020-08-20 LAB — CMP (CANCER CENTER ONLY)
ALT: 22 U/L (ref 0–44)
AST: 45 U/L — ABNORMAL HIGH (ref 15–41)
Albumin: 3.7 g/dL (ref 3.5–5.0)
Alkaline Phosphatase: 98 U/L (ref 38–126)
Anion gap: 7 (ref 5–15)
BUN: 6 mg/dL — ABNORMAL LOW (ref 8–23)
CO2: 28 mmol/L (ref 22–32)
Calcium: 9.5 mg/dL (ref 8.9–10.3)
Chloride: 103 mmol/L (ref 98–111)
Creatinine: 0.75 mg/dL (ref 0.44–1.00)
GFR, Estimated: >60 mL/min (ref >60)
Glucose, Bld: 102 mg/dL — ABNORMAL HIGH (ref 70–99)
Potassium: 3.3 mmol/L — ABNORMAL LOW (ref 3.5–5.1)
Sodium: 138 mmol/L (ref 135–145)
Total Bilirubin: 1.2 mg/dL (ref 0.3–1.2)
Total Protein: 7.3 g/dL (ref 6.5–8.1)

## 2020-08-20 LAB — CBC WITH DIFFERENTIAL (CANCER CENTER ONLY)
Abs Immature Granulocytes: 0.03 10*3/uL (ref 0.00–0.07)
Basophils Absolute: 0 10*3/uL (ref 0.0–0.1)
Basophils Relative: 1 %
Eosinophils Absolute: 0.3 10*3/uL (ref 0.0–0.5)
Eosinophils Relative: 4 %
HCT: 32.9 % — ABNORMAL LOW (ref 36.0–46.0)
Hemoglobin: 11.4 g/dL — ABNORMAL LOW (ref 12.0–15.0)
Immature Granulocytes: 1 %
Lymphocytes Relative: 7 %
Lymphs Abs: 0.4 10*3/uL — ABNORMAL LOW (ref 0.7–4.0)
MCH: 33.5 pg (ref 26.0–34.0)
MCHC: 34.7 g/dL (ref 30.0–36.0)
MCV: 96.8 fL (ref 80.0–100.0)
Monocytes Absolute: 0.7 10*3/uL (ref 0.1–1.0)
Monocytes Relative: 12 %
Neutro Abs: 4.4 10*3/uL (ref 1.7–7.7)
Neutrophils Relative %: 75 %
Platelet Count: 167 10*3/uL (ref 150–400)
RBC: 3.4 MIL/uL — ABNORMAL LOW (ref 3.87–5.11)
RDW: 17.5 % — ABNORMAL HIGH (ref 11.5–15.5)
WBC Count: 5.9 10*3/uL (ref 4.0–10.5)
nRBC: 0 % (ref 0.0–0.2)

## 2020-08-20 MED ORDER — SODIUM CHLORIDE 0.9% FLUSH
10.0000 mL | Freq: Once | INTRAVENOUS | Status: AC
  Administered 2020-08-20: 10 mL
  Filled 2020-08-20: qty 10

## 2020-08-20 MED ORDER — HEPARIN SOD (PORK) LOCK FLUSH 100 UNIT/ML IV SOLN
500.0000 [IU] | Freq: Once | INTRAVENOUS | Status: AC
  Administered 2020-08-20: 500 [IU]
  Filled 2020-08-20: qty 5

## 2020-08-20 NOTE — Patient Instructions (Signed)

## 2020-08-21 ENCOUNTER — Ambulatory Visit: Payer: Medicare Other

## 2020-08-21 ENCOUNTER — Ambulatory Visit
Admission: RE | Admit: 2020-08-21 | Discharge: 2020-08-21 | Disposition: A | Discharge status: Home / Self Care | Payer: Medicare Other | Source: Ambulatory Visit | Attending: Radiation Oncology | Admitting: Radiation Oncology

## 2020-08-21 DIAGNOSIS — Z510 Encounter for antineoplastic radiation therapy: Secondary | ICD-10-CM | POA: Diagnosis not present

## 2020-08-22 ENCOUNTER — Ambulatory Visit
Admission: RE | Admit: 2020-08-22 | Discharge: 2020-08-22 | Disposition: A | Discharge status: Home / Self Care | Payer: Medicare Other | Source: Ambulatory Visit | Attending: Radiation Oncology | Admitting: Radiation Oncology

## 2020-08-22 ENCOUNTER — Other Ambulatory Visit: Payer: Self-pay

## 2020-08-22 ENCOUNTER — Ambulatory Visit: Payer: Medicare Other

## 2020-08-22 DIAGNOSIS — Z510 Encounter for antineoplastic radiation therapy: Secondary | ICD-10-CM | POA: Diagnosis not present

## 2020-08-25 ENCOUNTER — Ambulatory Visit
Admission: RE | Admit: 2020-08-25 | Discharge: 2020-08-25 | Disposition: A | Discharge status: Home / Self Care | Payer: Medicare Other | Source: Ambulatory Visit | Attending: Radiation Oncology | Admitting: Radiation Oncology

## 2020-08-25 ENCOUNTER — Encounter: Payer: Self-pay | Admitting: Radiation Oncology

## 2020-08-25 ENCOUNTER — Other Ambulatory Visit: Payer: Self-pay

## 2020-08-25 DIAGNOSIS — Z510 Encounter for antineoplastic radiation therapy: Secondary | ICD-10-CM | POA: Diagnosis not present

## 2020-08-27 ENCOUNTER — Inpatient Hospital Stay (HOSPITAL_BASED_OUTPATIENT_CLINIC_OR_DEPARTMENT_OTHER): Payer: Medicare Other | Admitting: Hematology and Oncology

## 2020-08-27 ENCOUNTER — Inpatient Hospital Stay: Payer: Medicare Other

## 2020-08-27 ENCOUNTER — Other Ambulatory Visit: Payer: Self-pay

## 2020-08-27 VITALS — BP 154/70 | HR 83 | Temp 97.2°F | Resp 18 | Ht 63.0 in | Wt 153.8 lb

## 2020-08-27 DIAGNOSIS — Z95828 Presence of other vascular implants and grafts: Secondary | ICD-10-CM | POA: Diagnosis not present

## 2020-08-27 DIAGNOSIS — C20 Malignant neoplasm of rectum: Secondary | ICD-10-CM

## 2020-08-27 DIAGNOSIS — Z510 Encounter for antineoplastic radiation therapy: Secondary | ICD-10-CM | POA: Diagnosis not present

## 2020-08-27 LAB — CBC WITH DIFFERENTIAL (CANCER CENTER ONLY)
Abs Immature Granulocytes: 0.02 10*3/uL (ref 0.00–0.07)
Basophils Absolute: 0 10*3/uL (ref 0.0–0.1)
Basophils Relative: 1 %
Eosinophils Absolute: 0.3 10*3/uL (ref 0.0–0.5)
Eosinophils Relative: 6 %
HCT: 33.9 % — ABNORMAL LOW (ref 36.0–46.0)
Hemoglobin: 11.4 g/dL — ABNORMAL LOW (ref 12.0–15.0)
Immature Granulocytes: 0 %
Lymphocytes Relative: 10 %
Lymphs Abs: 0.4 10*3/uL — ABNORMAL LOW (ref 0.7–4.0)
MCH: 32.9 pg (ref 26.0–34.0)
MCHC: 33.6 g/dL (ref 30.0–36.0)
MCV: 97.7 fL (ref 80.0–100.0)
Monocytes Absolute: 0.7 10*3/uL (ref 0.1–1.0)
Monocytes Relative: 16 %
Neutro Abs: 3.1 10*3/uL (ref 1.7–7.7)
Neutrophils Relative %: 67 %
Platelet Count: 182 10*3/uL (ref 150–400)
RBC: 3.47 MIL/uL — ABNORMAL LOW (ref 3.87–5.11)
RDW: 17.7 % — ABNORMAL HIGH (ref 11.5–15.5)
WBC Count: 4.5 10*3/uL (ref 4.0–10.5)
nRBC: 0 % (ref 0.0–0.2)

## 2020-08-27 LAB — CMP (CANCER CENTER ONLY)
ALT: 24 U/L (ref 0–44)
AST: 44 U/L — ABNORMAL HIGH (ref 15–41)
Albumin: 4.2 g/dL (ref 3.5–5.0)
Alkaline Phosphatase: 93 U/L (ref 38–126)
Anion gap: 8 (ref 5–15)
BUN: 7 mg/dL — ABNORMAL LOW (ref 8–23)
CO2: 27 mmol/L (ref 22–32)
Calcium: 9.3 mg/dL (ref 8.9–10.3)
Chloride: 106 mmol/L (ref 98–111)
Creatinine: 0.79 mg/dL (ref 0.44–1.00)
GFR, Estimated: >60 mL/min (ref >60)
Glucose, Bld: 99 mg/dL (ref 70–99)
Potassium: 3.3 mmol/L — ABNORMAL LOW (ref 3.5–5.1)
Sodium: 141 mmol/L (ref 135–145)
Total Bilirubin: 1.1 mg/dL (ref 0.3–1.2)
Total Protein: 7.1 g/dL (ref 6.5–8.1)

## 2020-08-27 MED ORDER — SODIUM CHLORIDE 0.9% FLUSH
10.0000 mL | Freq: Once | INTRAVENOUS | Status: AC
  Administered 2020-08-27: 10 mL
  Filled 2020-08-27: qty 10

## 2020-08-27 MED ORDER — HEPARIN SOD (PORK) LOCK FLUSH 100 UNIT/ML IV SOLN
500.0000 [IU] | Freq: Once | INTRAVENOUS | Status: AC
  Administered 2020-08-27: 500 [IU]
  Filled 2020-08-27: qty 5

## 2020-08-29 ENCOUNTER — Telehealth: Payer: Self-pay | Admitting: Hematology and Oncology

## 2020-08-29 NOTE — Telephone Encounter (Signed)
Scheduled per los. Called and left msg. Mailed printout  °

## 2020-09-03 ENCOUNTER — Encounter: Payer: Self-pay | Admitting: Hematology and Oncology

## 2020-09-03 NOTE — Progress Notes (Signed)
                                                                                                                                                             Patient Name: Cheyenne Scott MRN: [id number] DOB: 22-Aug-1955 Referring Physician: Narda Rutherford Date of Service: 08/25/2020 [facility], [place]                                                        End Of Treatment Note  Diagnoses: C20-Malignant neoplasm of rectum  Cancer Staging:  Stage IIIB, cT3N1M0 adenocarcinoma of the rectum  Intent: Curative  Radiation Treatment Dates: 07/14/2020 through 08/25/2020 Site Technique Total Dose (Gy) Dose per Fx (Gy) Completed Fx Beam Energies  Rectum: Rectum 3D 45/45 1.8 25/25 6X, 15X  Rectum: Rectum_Bst 3D 5.4/5.4 1.8 3/3 6X, 15X   Narrative: The patient tolerated radiation therapy relatively well. She did have diarrhea requiring lomotil.   Plan: The patient will receive a call in about one month from the radiation oncology department. She will continue follow up with Dr. [name] . She needs to follow up with Dr. Marcello Moores in [place] as well. ________________________________________________    Carola Rhine, PAC

## 2020-09-08 ENCOUNTER — Encounter: Payer: Self-pay | Admitting: Hematology and Oncology

## 2020-09-08 NOTE — Progress Notes (Signed)
Cheyenne Scott [phone]   [phone]) [phone]  PROGRESS NOTE  Patient Care Team: Patient, No Pcp Per (Inactive) as PCP - General (General Practice) [name], RN (Inactive) as Oncology Nurse Navigator [name], MD as Consulting Physician (Hematology and Oncology)  Hematological/Oncological History # Rectal Adenocarcainoma, Stage T3N1M0 1) 01/18/2020: colonoscopy due to bright red blood per rectum. A mass was found in the rectum 8 cm from the anal verge. Biopsy confirms moderately differentiated adenocarcinoma.  2) 01/29/2020: CT chest showed no evidence of metastatic disease. MRI showed a T2-T3 malignancy that appears to involve the internal sphincter and 2 mesorectal nodes 3) 02/07/2020: seen at [facility] by Oncologist Dr. Georgiann Cocker 4) 02/11/2020: seen at [facility] by Dr. [name] 5) 02/21/2020: establish care with Dr. [name]  6) 03/05/2020: Start of Neoadjuvant FOLFOX (anticipate 12-16 weeks of chemotherapy) 7) 03/19/2020: Cycle 2 Day 1 of Neoadjuvant FOLFOX  8) 04/02/2020: Cycle 3 Day 1 of Neoadjuvant FOLFOX  9) 04/16/2020: Cycle 4 Day 1 of Neoadjuvant FOLFOX  10) 04/30/2020: Cycle 5 HELD due to HFS 11) 05/14/2020: Cycle 5 Day 1 of Neoadjuvant FOLFOX (25% dose reduction of the 5-FU due to HFS) 12) 05/28/2020: Cycle 6 Day 1 of Neoadjuvant FOLFOX (25% dose reduction of the 5-FU due to HFS) 13) 06/12/2020: Cycle 7 Day 1 of Neoadjuvant FOLFOX (25% dose reduction of the 5-FU due to HFS) 14) 06/25/2020: Cycle 8 Day 1 of Neoadjuvant FOLFOX (25% dose reduction of the 5-FU due to HFS) 15) 07/14/2020: start of chemoradiation with capecitabine '1500mg'$  PO BID ('825mg'$ /m2). Last day 08/21/2020  Interval History:  Cheyenne Scott 65 y.o. female with medical history significant for rectal cancer who presents for a follow up visit. The patient's last visit was on 08/13/2020. In the interim since the last visit Cheyenne Scott has completed chemoradiation therapy.    On exam today Cheyenne Scott reports she has a clinic visit coming up with Dr. Marcello Moores in early August.  Her weight has declined from 103 pounds down from 162 pounds around the time of her last visit.  She notes that she is eating the exact same amount but is having a lot of diarrhea.  She reports it is been off and on and fluctuating patterns.  She has been trying to take Lomotil in order to slow this down.  She notes also that she is sleeping now about 10 hours per night when previously she was sleeping about 6 to 7 hours per night.  She notes that she has no energy after trying to climb 2 flights of stairs.  Fortunately she is not in any pain at the moment but over the weekend he developed some lower pelvic pain.  She notes that his "tightness feeling".  She reports that she has been craving salt all of last week as well.  She denies any fevers, chills, sweats, vomiting.  Full 10 point ROS is listed below.   MEDICAL HISTORY:  Past Medical History:  Diagnosis Date   Acid reflux    Allergy    Rectal cancer (Cheyenne Scott)     SURGICAL HISTORY: Past Surgical History:  Procedure Laterality Date   ANTERIOR AND POSTERIOR REPAIR N/A 01/20/2015   Procedure: ANTERIOR (CYSTOCELE) AND POSTERIOR REPAIR (RECTOCELE) WITH PERINEORRHAPHY AND UTEROSACRAL LIGAMENT  SUSPENSION ;  Surgeon: Brien Few, MD;  Location: [facility];  Service: Gynecology;  Laterality: N/A;   IR IMAGING GUIDED PORT INSERTION  02/26/2020   ROBOTIC ASSISTED TOTAL HYSTERECTOMY WITH SALPINGECTOMY Bilateral 01/20/2015  Procedure: ROBOTIC ASSISTED TOTAL HYSTERECTOMY WITH BILATERAL SALPINGECTOMY;  Surgeon: Brien Few, MD;  Location: [facility];  Service: Gynecology;  Laterality: Bilateral;   TUBAL LIGATION      SOCIAL HISTORY: Social History   Socioeconomic History   Marital status: Widowed    Spouse name: Not on file   Number of children: Not on file   Years of education: Not on file   Highest education level: Not on file  Occupational  History   Not on file  Tobacco Use   Smoking status: Never   Smokeless tobacco: Never  Substance and Sexual Activity   Alcohol use: No    Alcohol/week: 0.0 standard drinks   Drug use: No   Sexual activity: Not on file  Other Topics Concern   Not on file  Social History Narrative   Not on file   Social Determinants of Health   Financial Resource Strain: Not on file  Food Insecurity: Not on file  Transportation Needs: Not on file  Physical Activity: Not on file  Stress: Not on file  Social Connections: Not on file  Intimate Partner Violence: Not on file    FAMILY HISTORY: Family History  Problem Relation Age of Onset   Diabetes Mother    Heart disease Mother    Cancer Brother    Stroke Maternal Grandmother     ALLERGIES:  is allergic to penicillins.  MEDICATIONS:  Current Outpatient Medications  Medication Sig Dispense Refill   acetaminophen (TYLENOL) 325 MG tablet Take 650 mg by mouth every 6 (six) hours as needed.     ascorbic acid (VITAMIN C) 250 MG tablet Take by mouth.     Cholecalciferol (VITAMIN D3) 25 MCG (1000 UT) CAPS Take by mouth.     diphenoxylate-atropine (LOMOTIL) 2.5-0.025 MG tablet Take 2 tablets by mouth 4 (four) times daily as needed for diarrhea or loose stools. 120 tablet 1   estradiol (ESTRACE) 0.1 MG/GM vaginal cream Place vaginally.     L-Lysine 500 MG TABS Take by mouth.     lidocaine-prilocaine (EMLA) cream Apply 1 application topically as needed. 30 g 0   Multiple Vitamin (MULTIVITAMIN WITH MINERALS) TABS tablet Take 1 tablet by mouth daily.     ondansetron (ZOFRAN) 8 MG tablet Take 1 tablet (8 mg total) by mouth every 8 (eight) hours as needed for nausea or vomiting. 30 tablet 0   potassium chloride SA (KLOR-CON) 20 MEQ tablet Take 1 tablet (20 mEq total) by mouth daily. 7 tablet 0   prochlorperazine (COMPAZINE) 10 MG tablet Take 1 tablet (10 mg total) by mouth every 6 (six) hours as needed for nausea or vomiting. 30 tablet 0   zinc  gluconate 50 MG tablet Take by mouth.     No current facility-administered medications for this visit.    REVIEW OF SYSTEMS:   Constitutional: ( - ) fevers, ( - )  chills , ( - ) night sweats Eyes: ( - ) blurriness of vision, ( - ) double vision, ( - ) watery eyes Ears, nose, mouth, throat, and face: ( - ) mucositis, ( - ) sore throat Respiratory: ( - ) cough, ( - ) dyspnea, ( - ) wheezes Cardiovascular: ( - ) palpitation, ( - ) chest discomfort, ( - ) lower extremity swelling Gastrointestinal:  ( - ) nausea, ( - ) heartburn, ( - ) change in bowel habits Skin: ( - ) abnormal skin rashes Lymphatics: ( - ) new lymphadenopathy, ( - ) easy bruising Neurological: ( - )  numbness, ( - ) tingling, ( - ) new weaknesses Behavioral/Psych: ( - ) mood change, ( - ) new changes  All other systems were reviewed with the patient and are negative.  PHYSICAL EXAMINATION: ECOG PERFORMANCE STATUS: 1 - Symptomatic but completely ambulatory  Vitals:   08/27/20 1423  BP: (!) 154/70  Pulse: 83  Resp: 18  Temp: (!) 97.2 F (36.2 C)  SpO2: 99%    Filed Weights   08/27/20 1423  Weight: 153 lb 12.8 oz (69.8 kg)     Telephone Visit: no vitals or physical exam.   LABORATORY DATA:  I have reviewed the data as listed CBC Latest Ref Rng & Units 08/27/2020 08/20/2020 08/13/2020  WBC 4.0 - 10.5 K/uL 4.5 5.9 4.3  Hemoglobin 12.0 - 15.0 g/dL 11.4(L) 11.4(L) 10.6(L)  Hematocrit 36.0 - 46.0 % 33.9(L) 32.9(L) 30.6(L)  Platelets 150 - 400 K/uL 182 167 162    CMP Latest Ref Rng & Units 08/27/2020 08/20/2020 08/13/2020  Glucose 70 - 99 mg/dL 99 102(H) 128(H)  BUN 8 - 23 mg/dL 7(L) 6(L) 7(L)  Creatinine 0.44 - 1.00 mg/dL 0.79 0.75 0.80  Sodium 135 - 145 mmol/L 141 138 143  Potassium 3.5 - 5.1 mmol/L 3.3(L) 3.3(L) 3.0(L)  Chloride 98 - 111 mmol/L 106 103 106  CO2 22 - 32 mmol/L '27 28 28  '$ Calcium 8.9 - 10.3 mg/dL 9.3 9.5 9.0  Total Protein 6.5 - 8.1 g/dL 7.1 7.3 6.9  Total Bilirubin 0.3 - 1.2 mg/dL 1.1 1.2 1.0   Alkaline Phos 38 - 126 U/L 93 98 85  AST 15 - 41 U/L 44(H) 45(H) 31  ALT 0 - 44 U/L '24 22 16     '$ RADIOGRAPHIC STUDIES: No results found.  ASSESSMENT & PLAN Shakeerah Pienkowski 65 y.o. female with medical history significant for rectal cancer who presents for a follow up visit.  The current plan to treat the patient with FOLFOX chemotherapy every 2 weeks x 8 cycles, for total of 16 weeks for the treatment.  Then we will start chemoradiation with capecitabine 825 mg per metered squared twice daily on days of radiation, typically for about 28 fractions.  Mrs. Breach established with Dr. Lisbeth Renshaw who is performing the radiation component of her treatment.  Once this is complete we will reevaluate with repeat imaging in proceed to surgery if feasible.  The patient and her daughter-in-law voiced their understanding of this plan moving forward.   Previously we discussed the FOLFOX chemotherapy treatment and the expected side effects including nausea, vomiting, diarrhea, neutropenia, nerve pain, and hand-foot syndrome.  Discussed that these are the most common and dangerous side effects, but is not limited to these alone.  She has completed both FOLFOX chemotherapy as well as capecitabine with chemoradiation well.  Her hand-foot syndrome is currently improving.  Other than fatigue and diarrhea she is not having any major residual symptoms at this time.  She is currently on track for surgical resection.  She will be meeting with surgery 09/11/2020.   # Rectal Adenocarcainoma, Stage T3N1M0 -- Findings consistent with a stage III T3 N1 M0 rectal adenocarcinoma.  Treatment of choice would be total neoadjuvant chemotherapy/ chemoradiation followed by surgical resection. -- Started Cycle 1 Day 1 of FOLFOX chemotherapy on 03/05/2020. She completed 8 cycles total.  -- initial pretreatment CEA found to be within normal limits, not likely to be of value moving forward.  --started Xeloda 1500 mg PO BID ('825mg'$ /m2) on  07/16/2020. Radiation therapy started on 07/14/2020. Xeloda  to be taken on radiation days only.  End date of chemoradiation was 08/21/2020.  --monitor CBC, CMP, and magnesium  --surgical visit on 09/11/2020. Anticipate repeat imaging at 8 weeks time.  --RTC in 4 weeks to reassess.   #Hand Foot Syndrome, Grade II-improved --developed after Cycle 3 of FOLFOX --encourage use of moisturizer -continue to monitor    #Supportive Care --chemotherapy education completed --port in place --zofran '8mg'$  q8H PRN and compazine '10mg'$  PO q6H for nausea --lomotil for diarrhea -- EMLA cream for port -- no pain medication required at this time.    No orders of the defined types were placed in this encounter.  All questions were answered. The patient knows to call the clinic with any problems, questions or concerns.  A total of more than 30 minutes were spent on this encounter and over half of that time was spent on counseling and coordination of care as outlined above.   Ledell Peoples, MD Department of Hematology/Oncology [place] [facility] Phone: [phone] Pager: [phone] Email: [email or web address]  09/08/2020 2:22 PM

## 2020-09-11 ENCOUNTER — Ambulatory Visit: Payer: Self-pay | Admitting: General Surgery

## 2020-09-11 NOTE — H&P (Signed)
REFERRING PHYSICIAN:  Hayden Pedro  PROVIDER:  Monico Blitz, MD  MRN: [id number] DOB: Jul 06, 1955 DATE OF ENCOUNTER: 09/11/2020  Subjective   Chief Complaint: recheck      History of Present Illness: Cheyenne Scott is a 65 y.o. female who presents with colorectal cancer.  She presented to the office last month for evaluation of a stage III T3N1 rectal cancer.  She had a colonoscopy in December due to bright red blood per rectum.  The mass was found to be ~ 2 cm from anal verge.  Biopsies confirmed well-differentiated adenocarcinoma.  CT scan showed no sign of metastatic disease.  MRI showed a T2-T3 malignancy that appeared to involve the internal sphincter with 2 positive mesorectal nodes.  In January she started neoadjuvant FOLFOX.  She completed this and started chemotherapy and radiation.  In early June.  She will finish this radiation on July 14.  She is seen several different healthcare systems including [facility] and [facility].  She has done well with treatment so far.  Past surgical history consist of an anterior and posterior repair of bladder prolapse and rectocele.  This was done in 2016.  She also had a robotic-assisted total hysterectomy at that time.  She has no major medical problems.  She finished RT on July 19.  On my exam in the office prior to radiation she was noted to have a posterior midline mass well above the coccyx approximately 6 cm from the sphincter complex.    Review of Systems: A complete review of systems was obtained from the patient.  I have reviewed this information and discussed as appropriate with the patient.  See HPI as well for other ROS.  Medical History: Past Medical History:  Diagnosis Date   GERD (gastroesophageal reflux disease)    Rectal cancer (CMS-HCC)     Patient Active Problem List  Diagnosis   Rectal cancer (CMS-HCC)   Pelvic relaxation disorder   Rectal cancer (CMS-HCC)   Encounter for antineoplastic chemotherapy    Hand foot syndrome   Hypokalemia   Neutropenia (CMS-HCC)   Port-A-Cath in place   Thrombocytopenia (CMS-HCC)   Rectal adenocarcinoma (CMS-HCC)    Past Surgical History:  Procedure Laterality Date   ESSURE TUBAL LIGATION     HYSTERECTOMY  01/2015   [place]      Allergies  Allergen Reactions   Penicillins Rash    Current Outpatient Medications on File Prior to Visit  Medication Sig Dispense Refill   ascorbic acid, vitamin C, (VITAMIN C) 250 MG tablet Take 500 mg by mouth every evening     BENEFIBER, WHEAT DEXTRIN, ORAL Take by mouth 2 (two) times daily     cholecalciferol, vitamin D3, (VITAMIN D3 ORAL) Take 1,000 Units by mouth once daily     ESTRADIOL VAGL Place 0.5 g vaginally twice a week     lysine 500 mg Tab Take by mouth every evening     multivitamin tablet Take 1 tablet by mouth every evening     omeprazole (PRILOSEC OTC) 20 MG EC tablet Take 20 mg by mouth every morning     zinc 50 mg Tab Take by mouth every evening     No current facility-administered medications on file prior to visit.    Family History  Problem Relation Age of Onset   Diabetes Mother    Parkinsonism Father    Multiple myeloma Brother      Social History   Tobacco Use  Smoking Status  Never Smoker  Smokeless Tobacco Never Used     Social History   Socioeconomic History   Marital status: Single   Number of children: 3  Occupational History   Occupation: retired    Comment: watches grandchildren  Tobacco Use   Smoking status: Never Smoker   Smokeless tobacco: Never Used  Scientific laboratory technician Use: Never used    Objective:    There were no vitals filed for this visit.   Exam Gen: NAD CV: RRR Lungs: CTA Abd: soft Rectal: Mobile posterior midline rectal mass approximately 6 to 8 cm from the anal verge well above the level of the coccyx.   Labs, Imaging and Diagnostic Testing: MRI: unable to visualize tumor well due to no rectal contrast  Assessment and Plan:   Diagnoses and all orders for this visit:  Malignant neoplasm of rectum (CMS-HCC)  Other orders -     polyethylene glycol (MIRALAX) powder; Take 233.75 g by mouth once for 1 dose Take according to your procedure prep instructions. -     bisacodyL (DULCOLAX) 5 mg EC tablet; Take 4 tablets (20 mg total) by mouth once daily as needed for Constipation for up to 1 dose -     metroNIDAZOLE (FLAGYL) 500 MG tablet; Take 1 tablet (500 mg total) by mouth 3 (three) times daily for 3 doses Take according to your procedure colon prep instructions -     neomycin 500 mg tablet; Take 2 tablets (1,000 mg total) by mouth 3 (three) times daily for 3 doses Take according to your procedure colon prep instructions    Patient appears to have a mid to distal posterior midline rectal tumor.  This appears to be approximately 6 to 8 cm from the anal verge on exam today.  I have recommended low anterior resection with diverting ileostomy.  This was discussed in detail with the patient and all questions were answered.   The surgery and anatomy were described to the patient as well as the risks of surgery and the possible complications.  These include: Bleeding, deep abdominal infections and possible wound complications such as hernia and infection, damage to adjacent structures, leak of surgical connections, which can lead to other surgeries and possibly an ostomy, possible need for other procedures, such as abscess drains in radiology, possible prolonged hospital stay, possible diarrhea from removal of part of the colon, possible constipation from narcotics, possible bowel, bladder or sexual dysfunction if having rectal surgery, prolonged fatigue/weakness or appetite loss, possible early recurrence of of disease, possible complications of their medical problems such as heart disease or arrhythmias or lung problems, death (less than 1%). I believe the patient understands and wishes to proceed with the surgery.

## 2020-09-29 ENCOUNTER — Inpatient Hospital Stay: Payer: Medicare Other | Attending: Hematology and Oncology | Admitting: Hematology and Oncology

## 2020-09-29 ENCOUNTER — Ambulatory Visit
Admission: RE | Admit: 2020-09-29 | Discharge: 2020-09-29 | Disposition: A | Discharge status: Home / Self Care | Payer: Medicare Other | Source: Ambulatory Visit | Attending: Hematology and Oncology | Admitting: Hematology and Oncology

## 2020-09-29 ENCOUNTER — Other Ambulatory Visit: Payer: Self-pay

## 2020-09-29 ENCOUNTER — Inpatient Hospital Stay: Payer: Medicare Other

## 2020-09-29 ENCOUNTER — Encounter: Payer: Self-pay | Admitting: Hematology and Oncology

## 2020-09-29 VITALS — BP 133/72 | HR 77 | Temp 97.6°F | Resp 18 | Ht 63.0 in | Wt 158.7 lb

## 2020-09-29 DIAGNOSIS — Z9221 Personal history of antineoplastic chemotherapy: Secondary | ICD-10-CM | POA: Diagnosis not present

## 2020-09-29 DIAGNOSIS — C20 Malignant neoplasm of rectum: Secondary | ICD-10-CM

## 2020-09-29 DIAGNOSIS — L271 Localized skin eruption due to drugs and medicaments taken internally: Secondary | ICD-10-CM | POA: Diagnosis not present

## 2020-09-29 DIAGNOSIS — R32 Unspecified urinary incontinence: Secondary | ICD-10-CM | POA: Insufficient documentation

## 2020-09-29 DIAGNOSIS — Z923 Personal history of irradiation: Secondary | ICD-10-CM | POA: Insufficient documentation

## 2020-09-29 DIAGNOSIS — Z95828 Presence of other vascular implants and grafts: Secondary | ICD-10-CM | POA: Diagnosis not present

## 2020-09-29 LAB — CMP (CANCER CENTER ONLY)
ALT: 31 U/L (ref 0–44)
AST: 37 U/L (ref 15–41)
Albumin: 3.9 g/dL (ref 3.5–5.0)
Alkaline Phosphatase: 127 U/L — ABNORMAL HIGH (ref 38–126)
Anion gap: 10 (ref 5–15)
BUN: 13 mg/dL (ref 8–23)
CO2: 25 mmol/L (ref 22–32)
Calcium: 9.4 mg/dL (ref 8.9–10.3)
Chloride: 106 mmol/L (ref 98–111)
Creatinine: 0.92 mg/dL (ref 0.44–1.00)
GFR, Estimated: >60 mL/min (ref >60)
Glucose, Bld: 89 mg/dL (ref 70–99)
Potassium: 3.8 mmol/L (ref 3.5–5.1)
Sodium: 141 mmol/L (ref 135–145)
Total Bilirubin: 0.9 mg/dL (ref 0.3–1.2)
Total Protein: 7.4 g/dL (ref 6.5–8.1)

## 2020-09-29 LAB — CBC WITH DIFFERENTIAL (CANCER CENTER ONLY)
Abs Immature Granulocytes: 0.03 10*3/uL (ref 0.00–0.07)
Basophils Absolute: 0.1 10*3/uL (ref 0.0–0.1)
Basophils Relative: 1 %
Eosinophils Absolute: 0.1 10*3/uL (ref 0.0–0.5)
Eosinophils Relative: 3 %
HCT: 34.5 % — ABNORMAL LOW (ref 36.0–46.0)
Hemoglobin: 11.7 g/dL — ABNORMAL LOW (ref 12.0–15.0)
Immature Granulocytes: 1 %
Lymphocytes Relative: 14 %
Lymphs Abs: 0.7 10*3/uL (ref 0.7–4.0)
MCH: 33.7 pg (ref 26.0–34.0)
MCHC: 33.9 g/dL (ref 30.0–36.0)
MCV: 99.4 fL (ref 80.0–100.0)
Monocytes Absolute: 0.7 10*3/uL (ref 0.1–1.0)
Monocytes Relative: 15 %
Neutro Abs: 3 10*3/uL (ref 1.7–7.7)
Neutrophils Relative %: 66 %
Platelet Count: 203 10*3/uL (ref 150–400)
RBC: 3.47 MIL/uL — ABNORMAL LOW (ref 3.87–5.11)
RDW: 13.4 % (ref 11.5–15.5)
WBC Count: 4.6 10*3/uL (ref 4.0–10.5)
nRBC: 0 % (ref 0.0–0.2)

## 2020-09-29 MED ORDER — SODIUM CHLORIDE 0.9% FLUSH
10.0000 mL | Freq: Once | INTRAVENOUS | Status: AC
  Administered 2020-09-29: 10 mL

## 2020-09-29 MED ORDER — HEPARIN SOD (PORK) LOCK FLUSH 100 UNIT/ML IV SOLN
500.0000 [IU] | Freq: Once | INTRAVENOUS | Status: AC
  Administered 2020-09-29: 500 [IU]

## 2020-09-29 NOTE — Progress Notes (Signed)
[place] [phone]   [phone]  PROGRESS NOTE  Patient Care Team: Patient, No Pcp Per (Inactive) as PCP - General (General Practice) [name], RN (Inactive) as Oncology Nurse Navigator [name], MD as Consulting Physician (Hematology and Oncology)  Hematological/Oncological History # Rectal Adenocarcainoma, Stage T3N1M0 1) 01/18/2020: colonoscopy due to bright red blood per rectum. A mass was found in the rectum 8 cm from the anal verge. Biopsy confirms moderately differentiated adenocarcinoma.  2) 01/29/2020: CT chest showed no evidence of metastatic disease. MRI showed a T2-T3 malignancy that appears to involve the internal sphincter and 2 mesorectal nodes 3) 02/07/2020: seen at [facility] by Oncologist Dr. Georgiann Cocker 4) 02/11/2020: seen at [facility] by Dr. [name] 5) 02/21/2020: establish care with Dr. [name]  6) 03/05/2020: Start of Neoadjuvant FOLFOX (anticipate 12-16 weeks of chemotherapy) 7) 03/19/2020: Cycle 2 Day 1 of Neoadjuvant FOLFOX  8) 04/02/2020: Cycle 3 Day 1 of Neoadjuvant FOLFOX  9) 04/16/2020: Cycle 4 Day 1 of Neoadjuvant FOLFOX  10) 04/30/2020: Cycle 5 HELD due to HFS 11) 05/14/2020: Cycle 5 Day 1 of Neoadjuvant FOLFOX (25% dose reduction of the 5-FU due to HFS) 12) 05/28/2020: Cycle 6 Day 1 of Neoadjuvant FOLFOX (25% dose reduction of the 5-FU due to HFS) 13) 06/12/2020: Cycle 7 Day 1 of Neoadjuvant FOLFOX (25% dose reduction of the 5-FU due to HFS) 14) 06/25/2020: Cycle 8 Day 1 of Neoadjuvant FOLFOX (25% dose reduction of the 5-FU due to HFS) 15) 07/14/2020: start of chemoradiation with capecitabine '1500mg'$  PO BID ('825mg'$ /m2). Last day 08/21/2020  Interval History:  Cheyenne Scott 65 y.o. female with medical history significant for rectal cancer who presents for a follow up visit. The patient's last visit was on 08/27/2020. In the interim since the last visit Cheyenne Scott has been in her normal state of  health.   On exam today Cheyenne Scott reports she has developed some unusual symptoms in the interim since her last visit.  She has developed pain in her feet as well as peeling.  This is unusual as she did not have any hand-foot syndrome affecting her feet during her treatments.  She reports that it was worse over the weekend but has slowly been recovering.  She notes that she is also having some urinary symptoms, particularly burning which has been associated with the radiation therapy.  She has otherwise her energy and appetite are both improving.  She is currently gaining weight and is up to 158 pounds up from 150 radiation completion.  She does still have episodes of fatigue but overall feels well.  She is currently scheduled for surgery in late September with Dr. Marcello Moores.  She denies any fevers, chills, sweats, vomiting.  Full 10 point ROS is listed below.   MEDICAL HISTORY:  Past Medical History:  Diagnosis Date   Acid reflux    Allergy    Rectal cancer ([place])     SURGICAL HISTORY: Past Surgical History:  Procedure Laterality Date   ANTERIOR AND POSTERIOR REPAIR N/A 01/20/2015   Procedure: ANTERIOR (CYSTOCELE) AND POSTERIOR REPAIR (RECTOCELE) WITH PERINEORRHAPHY AND UTEROSACRAL LIGAMENT  SUSPENSION ;  Surgeon: Brien Few, MD;  Location: [facility];  Service: Gynecology;  Laterality: N/A;   IR IMAGING GUIDED PORT INSERTION  02/26/2020   ROBOTIC ASSISTED TOTAL HYSTERECTOMY WITH SALPINGECTOMY Bilateral 01/20/2015   Procedure: ROBOTIC ASSISTED TOTAL HYSTERECTOMY WITH BILATERAL SALPINGECTOMY;  Surgeon: Brien Few, MD;  Location: [facility];  Service: Gynecology;  Laterality: Bilateral;  TUBAL LIGATION      SOCIAL HISTORY: Social History   Socioeconomic History   Marital status: Widowed    Spouse name: Not on file   Number of children: Not on file   Years of education: Not on file   Highest education level: Not on file  Occupational History   Not on file  Tobacco Use   Smoking  status: Never   Smokeless tobacco: Never  Substance and Sexual Activity   Alcohol use: No    Alcohol/week: 0.0 standard drinks   Drug use: No   Sexual activity: Not on file  Other Topics Concern   Not on file  Social History Narrative   Not on file   Social Determinants of Health   Financial Resource Strain: Not on file  Food Insecurity: Not on file  Transportation Needs: Not on file  Physical Activity: Not on file  Stress: Not on file  Social Connections: Not on file  Intimate Partner Violence: Not on file    FAMILY HISTORY: Family History  Problem Relation Age of Onset   Diabetes Mother    Heart disease Mother    Cancer Brother    Stroke Maternal Grandmother     ALLERGIES:  is allergic to penicillins.  MEDICATIONS:  Current Outpatient Medications  Medication Sig Dispense Refill   bisacodyl (DULCOLAX) 5 MG EC tablet Take by mouth.     acetaminophen (TYLENOL) 325 MG tablet Take 650 mg by mouth every 6 (six) hours as needed.     ascorbic acid (VITAMIN C) 250 MG tablet Take by mouth.     Cholecalciferol (VITAMIN D3) 25 MCG (1000 UT) CAPS Take by mouth.     diphenoxylate-atropine (LOMOTIL) 2.5-0.025 MG tablet Take 2 tablets by mouth 4 (four) times daily as needed for diarrhea or loose stools. 120 tablet 1   estradiol (ESTRACE) 0.1 MG/GM vaginal cream Place vaginally.     L-Lysine 500 MG TABS Take by mouth.     lidocaine-prilocaine (EMLA) cream Apply 1 application topically as needed. 30 g 0   Multiple Vitamin (MULTIVITAMIN WITH MINERALS) TABS tablet Take 1 tablet by mouth daily.     ondansetron (ZOFRAN) 8 MG tablet Take 1 tablet (8 mg total) by mouth every 8 (eight) hours as needed for nausea or vomiting. 30 tablet 0   potassium chloride SA (KLOR-CON) 20 MEQ tablet Take 1 tablet (20 mEq total) by mouth daily. 7 tablet 0   prochlorperazine (COMPAZINE) 10 MG tablet Take 1 tablet (10 mg total) by mouth every 6 (six) hours as needed for nausea or vomiting. 30 tablet 0   zinc  gluconate 50 MG tablet Take by mouth.     No current facility-administered medications for this visit.    REVIEW OF SYSTEMS:   Constitutional: ( - ) fevers, ( - )  chills , ( - ) night sweats Eyes: ( - ) blurriness of vision, ( - ) double vision, ( - ) watery eyes Ears, nose, mouth, throat, and face: ( - ) mucositis, ( - ) sore throat Respiratory: ( - ) cough, ( - ) dyspnea, ( - ) wheezes Cardiovascular: ( - ) palpitation, ( - ) chest discomfort, ( - ) lower extremity swelling Gastrointestinal:  ( - ) nausea, ( - ) heartburn, ( - ) change in bowel habits Skin: ( - ) abnormal skin rashes Lymphatics: ( - ) new lymphadenopathy, ( - ) easy bruising Neurological: ( - ) numbness, ( - ) tingling, ( - ) new  weaknesses Behavioral/Psych: ( - ) mood change, ( - ) new changes  All other systems were reviewed with the patient and are negative.  PHYSICAL EXAMINATION: ECOG PERFORMANCE STATUS: 1 - Symptomatic but completely ambulatory  Vitals:   09/29/20 1102  BP: 133/72  Pulse: 77  Resp: 18  Temp: 97.6 F (36.4 C)  SpO2: 100%    Filed Weights   09/29/20 1102  Weight: 158 lb 11.2 oz (72 kg)     Telephone Visit: no vitals or physical exam.   LABORATORY DATA:  I have reviewed the data as listed CBC Latest Ref Rng & Units 09/29/2020 08/27/2020 08/20/2020  WBC 4.0 - 10.5 K/uL 4.6 4.5 5.9  Hemoglobin 12.0 - 15.0 g/dL 11.7(L) 11.4(L) 11.4(L)  Hematocrit 36.0 - 46.0 % 34.5(L) 33.9(L) 32.9(L)  Platelets 150 - 400 K/uL 203 182 167    CMP Latest Ref Rng & Units 09/29/2020 08/27/2020 08/20/2020  Glucose 70 - 99 mg/dL 89 99 102(H)  BUN 8 - 23 mg/dL 13 7(L) 6(L)  Creatinine 0.44 - 1.00 mg/dL 0.92 0.79 0.75  Sodium 135 - 145 mmol/L 141 141 138  Potassium 3.5 - 5.1 mmol/L 3.8 3.3(L) 3.3(L)  Chloride 98 - 111 mmol/L 106 106 103  CO2 22 - 32 mmol/L '25 27 28  '$ Calcium 8.9 - 10.3 mg/dL 9.4 9.3 9.5  Total Protein 6.5 - 8.1 g/dL 7.4 7.1 7.3  Total Bilirubin 0.3 - 1.2 mg/dL 0.9 1.1 1.2  Alkaline Phos  38 - 126 U/L 127(H) 93 98  AST 15 - 41 U/L 37 44(H) 45(H)  ALT 0 - 44 U/L '31 24 22     '$ RADIOGRAPHIC STUDIES: No results found.  ASSESSMENT & PLAN Cheyenne Scott 65 y.o. female with medical history significant for rectal cancer who presents for a follow up visit.  The current plan to treat the patient with FOLFOX chemotherapy every 2 weeks x 8 cycles, for total of 16 weeks for the treatment.  Then we will start chemoradiation with capecitabine 825 mg per metered squared twice daily on days of radiation, typically for about 28 fractions.  Cheyenne Scott established with Dr. Lisbeth Renshaw who is performing the radiation component of her treatment.  Once this is complete we will reevaluate with repeat imaging in proceed to surgery if feasible.  The patient and her daughter-in-law voiced their understanding of this plan moving forward.   Previously we discussed the FOLFOX chemotherapy treatment and the expected side effects including nausea, vomiting, diarrhea, neutropenia, nerve pain, and hand-foot syndrome.  Discussed that these are the most common and dangerous side effects, but is not limited to these alone.  She has completed both FOLFOX chemotherapy as well as capecitabine with chemoradiation well.  Her hand-foot syndrome is currently improving.  Other than fatigue and diarrhea she is not having any major residual symptoms at this time.  She is currently on track for surgical resection.  She will be meeting with surgery 09/11/2020.   # Rectal Adenocarcainoma, Stage T3N1M0 -- Findings consistent with a stage III T3 N1 M0 rectal adenocarcinoma.  Treatment of choice would be total neoadjuvant chemotherapy/ chemoradiation followed by surgical resection. -- Started Cycle 1 Day 1 of FOLFOX chemotherapy on 03/05/2020. She completed 8 cycles total.  -- initial pretreatment CEA found to be within normal limits, not likely to be of value moving forward.  --started Xeloda 1500 mg PO BID ('825mg'$ /m2) on 07/16/2020.  Radiation therapy started on 07/14/2020. Xeloda to be taken on radiation days only.  End date of  chemoradiation was 08/21/2020.  --monitor CBC, CMP, and magnesium  --surgery scheduled for 11/05/2020. --RTC 4 weeks after surgery to reassess.   #Hand Foot Syndrome, Grade II-improved --developed after Cycle 3 of FOLFOX --encourage use of moisturizer -continue to monitor    #Supportive Care --chemotherapy education completed --port in place --zofran '8mg'$  q8H PRN and compazine '10mg'$  PO q6H for nausea --lomotil for diarrhea -- EMLA cream for port -- no pain medication required at this time.    No orders of the defined types were placed in this encounter.  All questions were answered. The patient knows to call the clinic with any problems, questions or concerns.  A total of more than 30 minutes were spent on this encounter and over half of that time was spent on counseling and coordination of care as outlined above.   Ledell Peoples, MD Department of Hematology/Oncology [place] at [facility] Phone: [phone] Pager: [phone] Email: [email or web address]  09/29/2020 11:42 AM

## 2020-09-29 NOTE — Progress Notes (Signed)
  Radiation Oncology         [phone]) [phone] ________________________________  Name: Cheyenne Scott MRN: [id number]  Date of Service: 09/29/2020  DOB: 12/24/1955  Post Treatment Telephone Note  Diagnosis:   Stage IIIB, cT3N1M0 adenocarcinoma of the rectum  Interval Since Last Radiation:  5 weeks   07/14/2020 through 08/25/2020 Site Technique Total Dose (Gy) Dose per Fx (Gy) Completed Fx Beam Energies  Rectum: Rectum 3D 45/45 1.8 25/25 6X, 15X  Rectum: Rectum_Bst 3D 5.4/5.4 1.8 3/3 6X, 15X    Narrative:  The patient was contacted today for routine follow-up. During treatment she did very well with radiotherapy and did not have significant desquamation. Her bowels have slowed down and soreness has improved. Her bladder has had more leaking situation. She reports she is doing well and has elected to proceed with surgery under the care of Dr. Marcello Moores. She is having robotic LAR on 11/05/20.  Impression/Plan: 1. Stage IIIB, cT3N1M0 adenocarcinoma of the rectum. The patient has been doing well since completion of radiotherapy. We discussed that we would be happy to continue to follow her as needed, but she will also continue to follow up with Dr. [name] in medical oncology and Dr. Marcello Moores. 2. Urinary incontinence. We discussed urgent PT referral as she's trying to proceed with strengthening her pelvic floor for her upcoming surgery.     Carola Rhine, PAC

## 2020-10-01 ENCOUNTER — Telehealth: Payer: Self-pay | Admitting: Hematology and Oncology

## 2020-10-01 NOTE — Telephone Encounter (Signed)
Scheduled per los. Called and left msg. Mailed printout  °

## 2020-10-21 HISTORY — PX: ROBOT ASSISTED, LAPAROSCOPIC, COLECTOMY, LAR: SHX5490

## 2020-10-29 NOTE — Patient Instructions (Addendum)
DUE TO COVID-19 ONLY ONE VISITOR IS ALLOWED TO COME WITH YOU AND STAY IN THE WAITING ROOM ONLY DURING PRE OP AND PROCEDURE DAY OF SURGERY IF YOU ARE GOING HOME AFTER SURGERY. IF YOU ARE SPENDING THE NIGHT 2 PEOPLE MAY VISIT WITH YOU IN YOUR PRIVATE ROOM AFTER SURGERY UNTIL VISITING  HOURS ARE OVER AT 8:00 PM AND THE 1 VISITOR CAN SPEND THE NIGHT.   YOU NEED TO HAVE A COVID 19 TEST ON__9/26___THIS TEST MUST BE DONE BEFORE SURGERY,  COVID TESTING SITE  IS LOCATED AT [place], [place]. REMAIN IN YOUR CAR THIS IS A DRIVE UP TEST. AFTER YOUR COVID TEST PLEASE WEAR A MASK OUT IN PUBLIC AND SOCIAL DISTANCE AND [place] YOUR HANDS FREQUENTLY, ALSO ASK ALL YOUR CLOSE CONTACT PERSONS TO WEAR A MASK AND SOCIAL DISTANCE AND [place] THEIR HANDS FREQUENTLY ALSO.               Cheyenne Scott     Your procedure is scheduled on: 11/05/20   Report to [facility] Main  Entrance   Report to admitting at  6:15 AM     Call this number if you have problems the morning of surgery [phone]  Follow all instructions for the bowel prep from the Dr's office.  Drink plenty of fluids on the Day of prep to prevent dehydration.  DRINK 2 PRESURGERY ENSURE DRINKS THE NIGHT BEFORE SURGERY AT 10:00 PM .   NO SOLIDS AFTER MIDNIGHT THE DAY PRIOR TO THE SURGERY.   NOTHING BY MOUTH EXCEPT CLEAR LIQUIDS UNTIL  5:30 am  DRINK THE THIRD ENSURE AT 5:00 AM THE DAY OF SURGERY  CLEAR LIQUID DIET   Foods Allowed                                                                                                 Foods Excluded  Coffee and tea, regular and decaf  NO MILK OR CREAMER                           liquids that you cannot  Plain Jell-O any favor except red or purple                                           see through such as: Fruit ices (not with fruit pulp)                                                               milk, soups, orange juice  Iced Popsicles  All solid food Carbonated beverages, regular and diet                                    Cranberry, grape and apple juices Sports drinks like Gatorade Lightly seasoned clear broth or consume(fat free) Sugar     BRUSH YOUR TEETH MORNING OF SURGERY AND RINSE YOUR MOUTH OUT, NO CHEWING GUM CANDY OR MINTS.     Take these medicines the morning of surgery with A SIP OF WATER: Omeprazole                                You may not have any metal on your body including hair pins and              piercings  Do not wear jewelry, make-up, lotions, powders or perfumes, deodorant             Do not wear nail polish on your finger or toenails  Do not shave  48 hours prior to surgery.           Do not bring valuables to the hospital. [place].  Contacts, dentures or bridgework may not be worn into surgery.  Leave suitcase in the car. After surgery it may be brought to your room.                 [place] - Preparing for Surgery Before surgery, you can play an important role.  Because skin is not sterile, your skin needs to be as free of germs as possible.  You can reduce the number of germs on your skin by washing with CHG (chlorahexidine gluconate) soap before surgery.  CHG is an antiseptic cleaner which kills germs and bonds with the skin to continue killing germs even after washing. Please DO NOT use if you have an allergy to CHG or antibacterial soaps.  If your skin becomes reddened/irritated stop using the CHG and inform your nurse when you arrive at Short Stay. Do not shave (including legs and underarms) for at least 48 hours prior to the first CHG shower.   Please follow these instructions carefully:  1.  Shower with CHG Soap the night before surgery and the  morning of Surgery.  2.  If you choose to wash your hair, wash your hair first as usual with your  normal  shampoo.  3.  After you shampoo, rinse your hair  and body thoroughly to remove the  shampoo.                            4.  Use CHG as you would any other liquid soap.  You can apply chg directly  to the skin and wash                       Gently with a scrungie or clean washcloth.  5.  Apply the CHG Soap to your body ONLY FROM THE NECK DOWN.   Do not use on face/ open                           Wound or open sores. Avoid  contact with eyes, ears mouth and genitals (private parts).                       Wash face,  Genitals (private parts) with your normal soap.             6.  Wash thoroughly, paying special attention to the area where your surgery  will be performed.  7.  Thoroughly rinse your body with warm water from the neck down.  8.  DO NOT shower/wash with your normal soap after using and rinsing off  the CHG Soap.             9.  Pat yourself dry with a clean towel.            10.  Wear clean pajamas.            11.  Place clean sheets on your bed the night of your first shower and do not  sleep with pets. Day of Surgery : Do not apply any lotions/deodorants the morning of surgery.  Please wear clean clothes to the hospital/surgery center.  FAILURE TO FOLLOW THESE INSTRUCTIONS MAY RESULT IN THE CANCELLATION OF YOUR SURGERY PATIENT SIGNATURE_________________________________  NURSE SIGNATURE__________________________________  ________________________________________________________________________   [name]  An incentive spirometer is a tool that can help keep your lungs clear and active. This tool measures how well you are filling your lungs with each breath. Taking long deep breaths may help reverse or decrease the chance of developing breathing (pulmonary) problems (especially infection) following: A long period of time when you are unable to move or be active. BEFORE THE PROCEDURE  If the spirometer includes an indicator to show your best effort, your nurse or respiratory therapist will set it to a desired goal. If  possible, sit up straight or lean slightly forward. Try not to slouch. Hold the incentive spirometer in an upright position. INSTRUCTIONS FOR USE  Sit on the edge of your bed if possible, or sit up as far as you can in bed or on a chair. Hold the incentive spirometer in an upright position. Breathe out normally. Place the mouthpiece in your mouth and seal your lips tightly around it. Breathe in slowly and as deeply as possible, raising the piston or the ball toward the top of the column. Hold your breath for 3-5 seconds or for as long as possible. Allow the piston or ball to fall to the bottom of the column. Remove the mouthpiece from your mouth and breathe out normally. Rest for a few seconds and repeat Steps 1 through 7 at least 10 times every 1-2 hours when you are awake. Take your time and take a few normal breaths between deep breaths. The spirometer may include an indicator to show your best effort. Use the indicator as a goal to work toward during each repetition. After each set of 10 deep breaths, practice coughing to be sure your lungs are clear. If you have an incision (the cut made at the time of surgery), support your incision when coughing by placing a pillow or rolled up towels firmly against it. Once you are able to get out of bed, walk around indoors and cough well. You may stop using the incentive spirometer when instructed by your caregiver.  RISKS AND COMPLICATIONS Take your time so you do not get dizzy or light-headed. If you are in pain, you may need to take or ask for pain medication before doing incentive spirometry. It  is harder to take a deep breath if you are having pain. AFTER USE Rest and breathe slowly and easily. It can be helpful to keep track of a log of your progress. Your caregiver can provide you with a simple table to help with this. If you are using the spirometer at home, follow these instructions: [place] IF:  You are having difficultly using the  spirometer. You have trouble using the spirometer as often as instructed. Your pain medication is not giving enough relief while using the spirometer. You develop fever of 100.5 F (38.1 C) or higher. SEEK IMMEDIATE MEDICAL CARE IF:  You cough up bloody sputum that had not been present before. You develop fever of 102 F (38.9 C) or greater. You develop worsening pain at or near the incision site. MAKE SURE YOU:  Understand these instructions. Will watch your condition. Will get help right away if you are not doing well or get worse. Document Released: 06/07/2006 Document Revised: 04/19/2011 Document Reviewed: 08/08/2006 [facility] Patient Information 2014 [place], [place].   ________________________________________________________________________

## 2020-10-30 ENCOUNTER — Encounter (HOSPITAL_COMMUNITY): Payer: Self-pay

## 2020-10-30 ENCOUNTER — Encounter (HOSPITAL_COMMUNITY)
Admission: RE | Admit: 2020-10-30 | Discharge: 2020-10-30 | Disposition: A | Discharge status: Home / Self Care | Payer: Medicare Other | Source: Ambulatory Visit | Attending: General Surgery | Admitting: General Surgery

## 2020-10-30 ENCOUNTER — Other Ambulatory Visit: Payer: Self-pay

## 2020-10-30 DIAGNOSIS — Z01812 Encounter for preprocedural laboratory examination: Secondary | ICD-10-CM | POA: Insufficient documentation

## 2020-10-30 LAB — CBC
HCT: 36.2 % (ref 36.0–46.0)
Hemoglobin: 12 g/dL (ref 12.0–15.0)
MCH: 33 pg (ref 26.0–34.0)
MCHC: 33.1 g/dL (ref 30.0–36.0)
MCV: 99.5 fL (ref 80.0–100.0)
Platelets: 197 10*3/uL (ref 150–400)
RBC: 3.64 MIL/uL — ABNORMAL LOW (ref 3.87–5.11)
RDW: 11.9 % (ref 11.5–15.5)
WBC: 4.6 10*3/uL (ref 4.0–10.5)
nRBC: 0 % (ref 0.0–0.2)

## 2020-10-30 LAB — BASIC METABOLIC PANEL
Anion gap: 6 (ref 5–15)
BUN: 12 mg/dL (ref 8–23)
CO2: 26 mmol/L (ref 22–32)
Calcium: 9.1 mg/dL (ref 8.9–10.3)
Chloride: 107 mmol/L (ref 98–111)
Creatinine, Ser: 0.79 mg/dL (ref 0.44–1.00)
GFR, Estimated: >60 mL/min (ref >60)
Glucose, Bld: 112 mg/dL — ABNORMAL HIGH (ref 70–99)
Potassium: 3.2 mmol/L — ABNORMAL LOW (ref 3.5–5.1)
Sodium: 139 mmol/L (ref 135–145)

## 2020-10-30 NOTE — Consult Note (Addendum)
[place] Nurse requested for preoperative stoma site marking  Discussed surgical procedure and stoma creation with patient and daughter.  Explained role of the [place] nurse team.  Provided the patient with educational booklet and provided samples of pouching options.  Answered patient's and daughter's questions.   Examined patient sitting, and standing in order to place the marking in the patient's visual field, away from any creases or abdominal contour issues and within the rectus muscle.  Attempted to mark below the patient's belt line, but this was not possible since a significant crease occurs lower on the abd when the patient leans forward and should be avoided if possible.    Marked for colostomy in the LLQ  ___8_ cm to the left of the umbilicus and __7_CK above the umbilicus.  Marked for ileostomy in the RLQ  __8__cm to the right of the umbilicus and  __5__ cm above the umbilicus.  Patient's abdomen cleansed with CHG wipes at site markings, allowed to air dry prior to marking. Covered mark with thin film transparent dressing to preserve mark until date of surgery. Provided patient with marking pen and instructed to re-color in the marks if they begin to fade before surgery occurs.   [place] Nurse team will follow up with patient after surgery for continued ostomy care and teaching.   Julien Girt MSN, RN, [place], [place], [place]

## 2020-10-30 NOTE — Progress Notes (Signed)
COVID test Completed: 11/03/20   PCP -Lucky Cowboy FNP Oncologist- Dr. Loma Messing Cardiologist - none  Chest x-ray - no EKG - no Stress Test - no ECHO - no Cardiac Cath - no Pacemaker/ICD device last checked:NA  Sleep Study - no CPAP -   Fasting Blood Sugar - NA Checks Blood Sugar _____ times a day  Blood Thinner Instructions:NA Aspirin Instructions: Last Dose:  Anesthesia review: yes  Patient denies shortness of breath, fever, cough and chest pain at PAT appointment Pt has no SOB with activities but she has no energy from Chemo and radiation.  She has a port-a-cath in the upper Rt chest  Patient verbalized understanding of instructions that were given to them at the PAT appointment. Patient was also instructed that they will need to review over the PAT instructions again at home before surgery. yes

## 2020-10-31 LAB — HEMOGLOBIN A1C
Hgb A1c MFr Bld: 5.4 % (ref 4.8–5.6)
Mean Plasma Glucose: 108 mg/dL

## 2020-11-03 ENCOUNTER — Other Ambulatory Visit: Payer: Self-pay | Admitting: General Surgery

## 2020-11-04 LAB — SARS CORONAVIRUS 2 (TAT 6-24 HRS): SARS Coronavirus 2: NEGATIVE

## 2020-11-04 NOTE — Anesthesia Preprocedure Evaluation (Addendum)
Anesthesia Evaluation  Patient identified by MRN, date of birth, ID band Patient awake    Reviewed: Allergy & Precautions, NPO status , Patient's Chart, lab work & pertinent test results  History of Anesthesia Complications Negative for: history of anesthetic complications  Airway Mallampati: II  TM Distance: >3 FB Neck ROM: Full    Dental no notable dental hx. (+) Dental Advisory Given   Pulmonary neg pulmonary ROS,    Pulmonary exam normal        Cardiovascular negative cardio ROS Normal cardiovascular exam     Neuro/Psych negative neurological ROS     GI/Hepatic Neg liver ROS, GERD  ,  Endo/Other  negative endocrine ROS  Renal/GU negative Renal ROS     Musculoskeletal negative musculoskeletal ROS (+)   Abdominal   Peds  Hematology negative hematology ROS (+)   Anesthesia Other Findings   Reproductive/Obstetrics                            Anesthesia Physical Anesthesia Plan  ASA: 2  Anesthesia Plan: General   Post-op Pain Management:    Induction: Intravenous  PONV Risk Score and Plan: 4 or greater and Ondansetron, Dexamethasone, Midazolam and Aprepitant  Airway Management Planned: Oral ETT  Additional Equipment:   Intra-op Plan:   Post-operative Plan: Extubation in OR  Informed Consent: I have reviewed the patients History and Physical, chart, labs and discussed the procedure including the risks, benefits and alternatives for the proposed anesthesia with the patient or authorized representative who has indicated his/her understanding and acceptance.       Plan Discussed with: Anesthesiologist and CRNA  Anesthesia Plan Comments:        Anesthesia Quick Evaluation

## 2020-11-05 ENCOUNTER — Inpatient Hospital Stay (HOSPITAL_COMMUNITY): Payer: Medicare Other | Admitting: Anesthesiology

## 2020-11-05 ENCOUNTER — Inpatient Hospital Stay (HOSPITAL_COMMUNITY)
Admission: RE | Admit: 2020-11-05 | Discharge: 2020-11-10 | DRG: 331 | Disposition: A | Discharge status: Home Health | Payer: Medicare Other | Attending: General Surgery | Admitting: General Surgery

## 2020-11-05 ENCOUNTER — Encounter (HOSPITAL_COMMUNITY)
Admission: RE | Disposition: A | Discharge status: Home Health | Payer: Self-pay | Source: Home / Self Care | Attending: General Surgery

## 2020-11-05 ENCOUNTER — Encounter (HOSPITAL_COMMUNITY): Payer: Self-pay | Admitting: General Surgery

## 2020-11-05 ENCOUNTER — Other Ambulatory Visit: Payer: Self-pay

## 2020-11-05 DIAGNOSIS — Z20822 Contact with and (suspected) exposure to covid-19: Secondary | ICD-10-CM | POA: Diagnosis present

## 2020-11-05 DIAGNOSIS — Z9221 Personal history of antineoplastic chemotherapy: Secondary | ICD-10-CM

## 2020-11-05 DIAGNOSIS — K219 Gastro-esophageal reflux disease without esophagitis: Secondary | ICD-10-CM | POA: Diagnosis present

## 2020-11-05 DIAGNOSIS — C19 Malignant neoplasm of rectosigmoid junction: Principal | ICD-10-CM | POA: Diagnosis present

## 2020-11-05 DIAGNOSIS — C20 Malignant neoplasm of rectum: Secondary | ICD-10-CM | POA: Diagnosis present

## 2020-11-05 HISTORY — PX: DIVERTING ILEOSTOMY: SHX5799

## 2020-11-05 HISTORY — PX: XI ROBOTIC ASSISTED LOWER ANTERIOR RESECTION: SHX6558

## 2020-11-05 LAB — TYPE AND SCREEN
ABO/RH(D): B POS
Antibody Screen: NEGATIVE

## 2020-11-05 SURGERY — RESECTION, RECTUM, LOW ANTERIOR, ROBOT-ASSISTED
Anesthesia: General | Site: Abdomen

## 2020-11-05 MED ORDER — ENSURE PRE-SURGERY PO LIQD: 296.0000 mL | Freq: Once | ORAL | Status: DC

## 2020-11-05 MED ORDER — PROPOFOL 10 MG/ML IV BOLUS
INTRAVENOUS | Status: AC
  Filled 2020-11-05: qty 20

## 2020-11-05 MED ORDER — CELECOXIB 200 MG PO CAPS
200.0000 mg | ORAL_CAPSULE | Freq: Once | ORAL | Status: AC
  Administered 2020-11-05: 200 mg via ORAL
  Filled 2020-11-05: qty 1

## 2020-11-05 MED ORDER — MIDAZOLAM HCL 2 MG/2ML IJ SOLN
INTRAMUSCULAR | Status: AC
  Filled 2020-11-05: qty 2

## 2020-11-05 MED ORDER — HEPARIN SODIUM (PORCINE) 5000 UNIT/ML IJ SOLN
5000.0000 [IU] | Freq: Once | INTRAMUSCULAR | Status: AC
  Administered 2020-11-05: 5000 [IU] via SUBCUTANEOUS
  Filled 2020-11-05: qty 1

## 2020-11-05 MED ORDER — FENTANYL CITRATE PF 50 MCG/ML IJ SOSY
25.0000 ug | PREFILLED_SYRINGE | INTRAMUSCULAR | Status: AC | PRN
  Administered 2020-11-05 (×2): 25 ug via INTRAVENOUS
  Administered 2020-11-05: 50 ug via INTRAVENOUS
  Administered 2020-11-05: 25 ug via INTRAVENOUS

## 2020-11-05 MED ORDER — GLYCOPYRROLATE 0.2 MG/ML IJ SOLN
INTRAMUSCULAR | Status: AC
  Filled 2020-11-05: qty 1

## 2020-11-05 MED ORDER — BUPIVACAINE LIPOSOME 1.3 % IJ SUSP
INTRAMUSCULAR | Status: AC
  Filled 2020-11-05: qty 20

## 2020-11-05 MED ORDER — FENTANYL CITRATE PF 50 MCG/ML IJ SOSY
PREFILLED_SYRINGE | INTRAMUSCULAR | Status: AC
  Administered 2020-11-05: 25 ug via INTRAVENOUS
  Filled 2020-11-05: qty 1

## 2020-11-05 MED ORDER — ACETAMINOPHEN 500 MG PO TABS
1000.0000 mg | ORAL_TABLET | ORAL | Status: AC
  Administered 2020-11-05: 1000 mg via ORAL
  Filled 2020-11-05: qty 2

## 2020-11-05 MED ORDER — ORAL CARE MOUTH RINSE
15.0000 mL | Freq: Once | OROMUCOSAL | Status: AC
  Administered 2020-11-05: 15 mL via OROMUCOSAL

## 2020-11-05 MED ORDER — SODIUM CHLORIDE 0.9 % IV SOLN
2.0000 g | INTRAVENOUS | Status: AC
  Administered 2020-11-05: 2 g via INTRAVENOUS
  Filled 2020-11-05: qty 2

## 2020-11-05 MED ORDER — ENSURE SURGERY PO LIQD
237.0000 mL | Freq: Two times a day (BID) | ORAL | Status: DC
  Administered 2020-11-06 – 2020-11-10 (×9): 237 mL via ORAL

## 2020-11-05 MED ORDER — APREPITANT 40 MG PO CAPS
40.0000 mg | ORAL_CAPSULE | Freq: Once | ORAL | Status: AC
  Administered 2020-11-05: 40 mg via ORAL
  Filled 2020-11-05: qty 1

## 2020-11-05 MED ORDER — PHENYLEPHRINE 40 MCG/ML (10ML) SYRINGE FOR IV PUSH (FOR BLOOD PRESSURE SUPPORT)
PREFILLED_SYRINGE | INTRAVENOUS | Status: DC | PRN
  Administered 2020-11-05: 120 ug via INTRAVENOUS

## 2020-11-05 MED ORDER — ENOXAPARIN SODIUM 40 MG/0.4ML IJ SOSY
40.0000 mg | PREFILLED_SYRINGE | INTRAMUSCULAR | Status: DC
  Administered 2020-11-06 – 2020-11-10 (×5): 40 mg via SUBCUTANEOUS
  Filled 2020-11-05 (×5): qty 0.4

## 2020-11-05 MED ORDER — ONDANSETRON HCL 4 MG/2ML IJ SOLN
INTRAMUSCULAR | Status: DC | PRN
  Administered 2020-11-05: 4 mg via INTRAVENOUS

## 2020-11-05 MED ORDER — POLYVINYL ALCOHOL 1.4 % OP SOLN: 1.0000 [drp] | OPHTHALMIC | Status: DC | PRN

## 2020-11-05 MED ORDER — ONDANSETRON HCL 4 MG/2ML IJ SOLN
INTRAMUSCULAR | Status: AC
  Filled 2020-11-05: qty 2

## 2020-11-05 MED ORDER — ONDANSETRON HCL 4 MG PO TABS: 4.0000 mg | ORAL_TABLET | Freq: Four times a day (QID) | ORAL | Status: DC | PRN

## 2020-11-05 MED ORDER — PHENYLEPHRINE HCL-NACL 20-0.9 MG/250ML-% IV SOLN
INTRAVENOUS | Status: DC | PRN
  Administered 2020-11-05: 25 ug/min via INTRAVENOUS

## 2020-11-05 MED ORDER — PROPOFOL 10 MG/ML IV BOLUS
INTRAVENOUS | Status: DC | PRN
  Administered 2020-11-05: 150 mg via INTRAVENOUS

## 2020-11-05 MED ORDER — GLYCOPYRROLATE 0.2 MG/ML IJ SOLN
INTRAMUSCULAR | Status: DC | PRN
  Administered 2020-11-05: .2 mg via INTRAVENOUS

## 2020-11-05 MED ORDER — EPHEDRINE SULFATE-NACL 50-0.9 MG/10ML-% IV SOSY
PREFILLED_SYRINGE | INTRAVENOUS | Status: DC | PRN
  Administered 2020-11-05: 10 mg via INTRAVENOUS

## 2020-11-05 MED ORDER — FENTANYL CITRATE (PF) 100 MCG/2ML IJ SOLN
INTRAMUSCULAR | Status: DC | PRN
  Administered 2020-11-05 (×2): 100 ug via INTRAVENOUS

## 2020-11-05 MED ORDER — FENTANYL CITRATE PF 50 MCG/ML IJ SOSY
PREFILLED_SYRINGE | INTRAMUSCULAR | Status: AC
  Administered 2020-11-05: 25 ug
  Filled 2020-11-05: qty 1

## 2020-11-05 MED ORDER — 0.9 % SODIUM CHLORIDE (POUR BTL) OPTIME
TOPICAL | Status: DC | PRN
  Administered 2020-11-05: 2000 mL

## 2020-11-05 MED ORDER — HYDROMORPHONE HCL 1 MG/ML IJ SOLN
0.5000 mg | INTRAMUSCULAR | Status: DC | PRN
  Administered 2020-11-05: 0.5 mg via INTRAVENOUS
  Filled 2020-11-05: qty 0.5

## 2020-11-05 MED ORDER — MIDAZOLAM HCL 5 MG/5ML IJ SOLN
INTRAMUSCULAR | Status: DC | PRN
  Administered 2020-11-05: 2 mg via INTRAVENOUS

## 2020-11-05 MED ORDER — ALUM & MAG HYDROXIDE-SIMETH 200-200-20 MG/5ML PO SUSP: 30.0000 mL | Freq: Four times a day (QID) | ORAL | Status: DC | PRN

## 2020-11-05 MED ORDER — ALVIMOPAN 12 MG PO CAPS
12.0000 mg | ORAL_CAPSULE | Freq: Two times a day (BID) | ORAL | Status: DC
  Administered 2020-11-06 (×2): 12 mg via ORAL
  Filled 2020-11-05 (×3): qty 1

## 2020-11-05 MED ORDER — GABAPENTIN 300 MG PO CAPS
300.0000 mg | ORAL_CAPSULE | ORAL | Status: AC
  Administered 2020-11-05: 300 mg via ORAL
  Filled 2020-11-05: qty 1

## 2020-11-05 MED ORDER — ALVIMOPAN 12 MG PO CAPS
12.0000 mg | ORAL_CAPSULE | ORAL | Status: AC
  Administered 2020-11-05: 12 mg via ORAL
  Filled 2020-11-05: qty 1

## 2020-11-05 MED ORDER — LIDOCAINE 2% (20 MG/ML) 5 ML SYRINGE
INTRAMUSCULAR | Status: DC | PRN
  Administered 2020-11-05: 50 mg via INTRAVENOUS

## 2020-11-05 MED ORDER — KCL IN DEXTROSE-NACL 20-5-0.45 MEQ/L-%-% IV SOLN
INTRAVENOUS | Status: DC
  Filled 2020-11-05: qty 1000

## 2020-11-05 MED ORDER — GABAPENTIN 300 MG PO CAPS
300.0000 mg | ORAL_CAPSULE | Freq: Two times a day (BID) | ORAL | Status: DC
  Administered 2020-11-05 – 2020-11-10 (×10): 300 mg via ORAL
  Filled 2020-11-05 (×10): qty 1

## 2020-11-05 MED ORDER — PHENYLEPHRINE HCL (PRESSORS) 10 MG/ML IV SOLN
INTRAVENOUS | Status: AC
  Filled 2020-11-05: qty 2

## 2020-11-05 MED ORDER — BUPIVACAINE-EPINEPHRINE 0.25% -1:200000 IJ SOLN
INTRAMUSCULAR | Status: DC | PRN
  Administered 2020-11-05: 30 mL

## 2020-11-05 MED ORDER — CHLORHEXIDINE GLUCONATE 0.12 % MT SOLN: 15.0000 mL | Freq: Once | OROMUCOSAL | Status: AC

## 2020-11-05 MED ORDER — ROCURONIUM BROMIDE 10 MG/ML (PF) SYRINGE
PREFILLED_SYRINGE | INTRAVENOUS | Status: AC
  Filled 2020-11-05: qty 10

## 2020-11-05 MED ORDER — FENTANYL CITRATE PF 50 MCG/ML IJ SOSY
PREFILLED_SYRINGE | INTRAMUSCULAR | Status: AC
  Filled 2020-11-05: qty 1

## 2020-11-05 MED ORDER — METHYLENE BLUE 0.5 % INJ SOLN
INTRAVENOUS | Status: AC
  Filled 2020-11-05: qty 10

## 2020-11-05 MED ORDER — LACTATED RINGERS IV SOLN: INTRAVENOUS | Status: DC

## 2020-11-05 MED ORDER — PROMETHAZINE HCL 25 MG/ML IJ SOLN: 6.2500 mg | INTRAMUSCULAR | Status: DC | PRN

## 2020-11-05 MED ORDER — SUGAMMADEX SODIUM 200 MG/2ML IV SOLN
INTRAVENOUS | Status: DC | PRN
  Administered 2020-11-05: 200 mg via INTRAVENOUS

## 2020-11-05 MED ORDER — AMISULPRIDE (ANTIEMETIC) 5 MG/2ML IV SOLN: 10.0000 mg | Freq: Once | INTRAVENOUS | Status: DC | PRN

## 2020-11-05 MED ORDER — BSS IO SOLN
15.0000 mL | Freq: Once | INTRAOCULAR | Status: DC
  Filled 2020-11-05: qty 15

## 2020-11-05 MED ORDER — ROCURONIUM BROMIDE 10 MG/ML (PF) SYRINGE
PREFILLED_SYRINGE | INTRAVENOUS | Status: DC | PRN
  Administered 2020-11-05: 100 mg via INTRAVENOUS

## 2020-11-05 MED ORDER — DEXAMETHASONE SODIUM PHOSPHATE 10 MG/ML IJ SOLN
INTRAMUSCULAR | Status: AC
  Filled 2020-11-05: qty 1

## 2020-11-05 MED ORDER — BUPIVACAINE LIPOSOME 1.3 % IJ SUSP
20.0000 mL | Freq: Once | INTRAMUSCULAR | Status: AC
Start: 2020-11-05 — End: 2020-11-05
  Administered 2020-11-05: 20 mL

## 2020-11-05 MED ORDER — ONDANSETRON HCL 4 MG/2ML IJ SOLN
4.0000 mg | Freq: Four times a day (QID) | INTRAMUSCULAR | Status: DC | PRN
  Administered 2020-11-05: 4 mg via INTRAVENOUS
  Filled 2020-11-05: qty 2

## 2020-11-05 MED ORDER — DEXAMETHASONE SODIUM PHOSPHATE 10 MG/ML IJ SOLN
INTRAMUSCULAR | Status: DC | PRN
  Administered 2020-11-05: 10 mg via INTRAVENOUS

## 2020-11-05 MED ORDER — ACETAMINOPHEN 500 MG PO TABS
1000.0000 mg | ORAL_TABLET | Freq: Four times a day (QID) | ORAL | Status: DC
  Administered 2020-11-05 – 2020-11-10 (×14): 1000 mg via ORAL
  Filled 2020-11-05 (×15): qty 2

## 2020-11-05 MED ORDER — ENSURE PRE-SURGERY PO LIQD: 592.0000 mL | Freq: Once | ORAL | Status: DC

## 2020-11-05 MED ORDER — FENTANYL CITRATE (PF) 250 MCG/5ML IJ SOLN
INTRAMUSCULAR | Status: AC
  Filled 2020-11-05: qty 5

## 2020-11-05 MED ORDER — TRAMADOL HCL 50 MG PO TABS
50.0000 mg | ORAL_TABLET | Freq: Four times a day (QID) | ORAL | Status: DC | PRN
  Administered 2020-11-06 – 2020-11-07 (×2): 50 mg via ORAL
  Filled 2020-11-05 (×2): qty 1

## 2020-11-05 MED ORDER — BUPIVACAINE-EPINEPHRINE (PF) 0.25% -1:200000 IJ SOLN
INTRAMUSCULAR | Status: AC
  Filled 2020-11-05: qty 30

## 2020-11-05 MED ORDER — RINGERS IRRIGATION IR SOLN
Status: DC | PRN
  Administered 2020-11-05: 1

## 2020-11-05 MED ORDER — PROMETHAZINE HCL 25 MG/ML IJ SOLN
INTRAMUSCULAR | Status: AC
  Filled 2020-11-05: qty 1

## 2020-11-05 MED ORDER — SACCHAROMYCES BOULARDII 250 MG PO CAPS
250.0000 mg | ORAL_CAPSULE | Freq: Two times a day (BID) | ORAL | Status: DC
  Administered 2020-11-05 – 2020-11-10 (×10): 250 mg via ORAL
  Filled 2020-11-05 (×10): qty 1

## 2020-11-05 SURGICAL SUPPLY — 96 items
BAG COUNTER SPONGE SURGICOUNT (BAG) ×2 IMPLANT
BLADE EXTENDED COATED 6.5IN (ELECTRODE) ×2 IMPLANT
CANNULA REDUC XI 12-8 STAPL (CANNULA) ×1
CANNULA REDUCER 12-8 DVNC XI (CANNULA) ×1 IMPLANT
CATH ROBINSON RED A/P 16FR (CATHETERS) ×2 IMPLANT
CELLS DAT CNTRL 66122 CELL SVR (MISCELLANEOUS) IMPLANT
COVER SURGICAL LIGHT HANDLE (MISCELLANEOUS) ×4 IMPLANT
COVER TIP SHEARS 8 DVNC (MISCELLANEOUS) ×2 IMPLANT
COVER TIP SHEARS 8MM DA VINCI (MISCELLANEOUS) ×2
DECANTER SPIKE VIAL GLASS SM (MISCELLANEOUS) IMPLANT
DERMABOND ADVANCED (GAUZE/BANDAGES/DRESSINGS) ×2
DERMABOND ADVANCED .7 DNX12 (GAUZE/BANDAGES/DRESSINGS) ×2 IMPLANT
DRAIN CHANNEL 19F RND (DRAIN) IMPLANT
DRAPE ARM DVNC X/XI (DISPOSABLE) ×4 IMPLANT
DRAPE COLUMN DVNC XI (DISPOSABLE) ×1 IMPLANT
DRAPE DA VINCI XI ARM (DISPOSABLE) ×4
DRAPE DA VINCI XI COLUMN (DISPOSABLE) ×1
DRAPE SURG IRRIG POUCH 19X23 (DRAPES) ×2 IMPLANT
DRSG OPSITE POSTOP 4X10 (GAUZE/BANDAGES/DRESSINGS) IMPLANT
DRSG OPSITE POSTOP 4X6 (GAUZE/BANDAGES/DRESSINGS) IMPLANT
DRSG OPSITE POSTOP 4X8 (GAUZE/BANDAGES/DRESSINGS) IMPLANT
ELECT PENCIL ROCKER SW 15FT (MISCELLANEOUS) ×2 IMPLANT
ELECT REM PT RETURN 15FT ADLT (MISCELLANEOUS) ×2 IMPLANT
ENDOLOOP SUT PDS II  0 18 (SUTURE)
ENDOLOOP SUT PDS II 0 18 (SUTURE) IMPLANT
EVACUATOR SILICONE 100CC (DRAIN) IMPLANT
GLOVE SURG ENC MOIS LTX SZ6.5 (GLOVE) ×6 IMPLANT
GLOVE SURG UNDER POLY LF SZ7 (GLOVE) ×4 IMPLANT
GOWN STRL REUS W/TWL XL LVL3 (GOWN DISPOSABLE) ×6 IMPLANT
GRASPER SUT TROCAR 14GX15 (MISCELLANEOUS) IMPLANT
HOLDER FOLEY CATH W/STRAP (MISCELLANEOUS) ×2 IMPLANT
IRRIG SUCT STRYKERFLOW 2 WTIP (MISCELLANEOUS) ×2
IRRIGATION SUCT STRKRFLW 2 WTP (MISCELLANEOUS) ×1 IMPLANT
KIT PROCEDURE DA VINCI SI (MISCELLANEOUS)
KIT PROCEDURE DVNC SI (MISCELLANEOUS) IMPLANT
KIT TURNOVER KIT A (KITS) ×2 IMPLANT
NEEDLE INSUFFLATION 14GA 120MM (NEEDLE) ×2 IMPLANT
PACK CARDIOVASCULAR III (CUSTOM PROCEDURE TRAY) ×2 IMPLANT
PACK COLON (CUSTOM PROCEDURE TRAY) ×2 IMPLANT
PAD POSITIONING PINK XL (MISCELLANEOUS) ×2 IMPLANT
RELOAD STAPLER 3.5X60 BLU DVNC (STAPLE) IMPLANT
RELOAD STAPLER 4.3X45 GRN DVNC (STAPLE) ×2 IMPLANT
RELOAD STAPLER 4.3X60 GRN DVNC (STAPLE) IMPLANT
RTRCTR WOUND ALEXIS 18CM MED (MISCELLANEOUS)
RTRCTR WOUND ALEXIS 18CM SML (INSTRUMENTS) ×2
SAVER CELL AAL HAEMONETICS (INSTRUMENTS) ×1 IMPLANT
SCISSORS LAP 5X35 DISP (ENDOMECHANICALS) ×2 IMPLANT
SEAL CANN UNIV 5-8 DVNC XI (MISCELLANEOUS) ×4 IMPLANT
SEAL XI 5MM-8MM UNIVERSAL (MISCELLANEOUS) ×4
SEALER VESSEL DA VINCI XI (MISCELLANEOUS) ×1
SEALER VESSEL EXT DVNC XI (MISCELLANEOUS) ×1 IMPLANT
SOLUTION ELECTROLUBE (MISCELLANEOUS) ×2 IMPLANT
STAPLER 45 DA VINCI SURE FORM (STAPLE) ×1
STAPLER 45 SUREFORM DVNC (STAPLE) ×1 IMPLANT
STAPLER 60 DA VINCI SURE FORM (STAPLE)
STAPLER 60 SUREFORM DVNC (STAPLE) IMPLANT
STAPLER CANNULA SEAL DVNC XI (STAPLE) IMPLANT
STAPLER CANNULA SEAL XI (STAPLE)
STAPLER ECHELON POWER CIR 29 (STAPLE) ×2 IMPLANT
STAPLER ECHELON POWER CIR 31 (STAPLE) IMPLANT
STAPLER RELOAD 3.5X60 BLU DVNC (STAPLE)
STAPLER RELOAD 3.5X60 BLUE (STAPLE)
STAPLER RELOAD 4.3X45 GREEN (STAPLE) ×2
STAPLER RELOAD 4.3X45 GRN DVNC (STAPLE) ×2
STAPLER RELOAD 4.3X60 GREEN (STAPLE)
STAPLER RELOAD 4.3X60 GRN DVNC (STAPLE)
STOPCOCK 4 WAY LG BORE MALE ST (IV SETS) ×4 IMPLANT
SUT ETHILON 2 0 PS N (SUTURE) ×2 IMPLANT
SUT NOVA NAB DX-16 0-1 5-0 T12 (SUTURE) ×4 IMPLANT
SUT PROLENE 2 0 KS (SUTURE) IMPLANT
SUT SILK 2 0 (SUTURE) ×1
SUT SILK 2 0 SH (SUTURE) ×2 IMPLANT
SUT SILK 2 0 SH CR/8 (SUTURE) IMPLANT
SUT SILK 2-0 18XBRD TIE 12 (SUTURE) ×1 IMPLANT
SUT SILK 3 0 (SUTURE)
SUT SILK 3 0 SH CR/8 (SUTURE) ×2 IMPLANT
SUT SILK 3-0 18XBRD TIE 12 (SUTURE) IMPLANT
SUT V-LOC BARB 180 2/0GR6 GS22 (SUTURE) ×4
SUT VIC AB 2-0 SH 18 (SUTURE) ×4 IMPLANT
SUT VIC AB 2-0 SH 27 (SUTURE)
SUT VIC AB 2-0 SH 27X BRD (SUTURE) IMPLANT
SUT VIC AB 3-0 SH 18 (SUTURE) IMPLANT
SUT VIC AB 4-0 PS2 27 (SUTURE) ×4 IMPLANT
SUT VICRYL 0 UR6 27IN ABS (SUTURE) ×2 IMPLANT
SUTURE V-LC BRB 180 2/0GR6GS22 (SUTURE) ×2 IMPLANT
SYR 10ML ECCENTRIC (SYRINGE) ×2 IMPLANT
SYS LAPSCP GELPORT 120MM (MISCELLANEOUS)
SYS WOUND ALEXIS 18CM MED (MISCELLANEOUS) ×2
SYSTEM LAPSCP GELPORT 120MM (MISCELLANEOUS) IMPLANT
SYSTEM WOUND ALEXIS 18CM MED (MISCELLANEOUS) ×1 IMPLANT
TOWEL OR 17X26 10 PK STRL BLUE (TOWEL DISPOSABLE) IMPLANT
TOWEL OR NON WOVEN STRL DISP B (DISPOSABLE) ×2 IMPLANT
TRAY FOLEY MTR SLVR 16FR STAT (SET/KITS/TRAYS/PACK) ×2 IMPLANT
TROCAR ADV FIXATION 5X100MM (TROCAR) ×2 IMPLANT
TUBING CONNECTING 10 (TUBING) ×4 IMPLANT
TUBING INSUFFLATION 10FT LAP (TUBING) ×2 IMPLANT

## 2020-11-05 NOTE — Transfer of Care (Signed)
Immediate Anesthesia Transfer of Care Note  Patient: Cheyenne Scott  Procedure(s) Performed: Procedure(s): XI ROBOTIC ASSISTED LOWER ANTERIOR RESECTION (N/A) DIVERTING ILEOSTOMY (N/A)  Patient Location: PACU  Anesthesia Type:General  Level of Consciousness: Alert, Awake, Oriented  Airway & Oxygen Therapy: Patient Spontanous Breathing  Post-op Assessment: Report given to RN  Post vital signs: Reviewed and stable  Last Vitals:  Vitals:   11/05/20 0649  BP: 134/78  Pulse: 91  Resp: 16  Temp: 36.8 C  SpO2: 51%    Complications: No apparent anesthesia complications

## 2020-11-05 NOTE — Consult Note (Signed)
[place] Nurse requested for preoperative stoma site marking Patient was marked by D. Engels on 9/22 for today's ostomy surgery. Val Riles, RN, MSN, CWOCN, CNS-BC, pager [phone]

## 2020-11-05 NOTE — H&P (Signed)
History of Present Illness: Cheyenne Scott is a 65 y.o. female who presents with colorectal cancer.  She presented to the office last month for evaluation of a stage III T3N1 rectal cancer.  She had a colonoscopy in December due to bright red blood per rectum.  The mass was found to be ~ 2 cm from anal verge.  Biopsies confirmed well-differentiated adenocarcinoma.  CT scan showed no sign of metastatic disease.  MRI showed a T2-T3 malignancy that appeared to involve the internal sphincter with 2 positive mesorectal nodes.  In January she started neoadjuvant FOLFOX.  She completed this and started chemotherapy and radiation.  In early June.  She will finish this radiation on July 14.  She is seen several different healthcare systems including [facility] and [facility].  She has done well with treatment so far.  Past surgical history consist of an anterior and posterior repair of bladder prolapse and rectocele.  This was done in 2016.  She also had a robotic-assisted total hysterectomy at that time.  She has no major medical problems.  She finished RT on July 19.  On my exam in the office prior to radiation she was noted to have a posterior midline mass well above the coccyx approximately 6 cm from the sphincter complex.       Review of Systems: A complete review of systems was obtained from the patient.  I have reviewed this information and discussed as appropriate with the patient.  See HPI as well for other ROS.   Medical History:     Past Medical History:  Diagnosis Date   GERD (gastroesophageal reflux disease)     Rectal cancer (CMS-HCC)           Patient Active Problem List  Diagnosis   Rectal cancer (CMS-HCC)   Pelvic relaxation disorder   Rectal cancer (CMS-HCC)   Encounter for antineoplastic chemotherapy   Hand foot syndrome   Hypokalemia   Neutropenia (CMS-HCC)   Port-A-Cath in place   Thrombocytopenia (CMS-HCC)   Rectal adenocarcinoma (CMS-HCC)           Past Surgical History:   Procedure Laterality Date   ESSURE TUBAL LIGATION       HYSTERECTOMY   01/2015    [place]           Allergies  Allergen Reactions   Penicillins Rash            Current Outpatient Medications on File Prior to Visit  Medication Sig Dispense Refill   ascorbic acid, vitamin C, (VITAMIN C) 250 MG tablet Take 500 mg by mouth every evening       BENEFIBER, WHEAT DEXTRIN, ORAL Take by mouth 2 (two) times daily       cholecalciferol, vitamin D3, (VITAMIN D3 ORAL) Take 1,000 Units by mouth once daily       ESTRADIOL VAGL Place 0.5 g vaginally twice a week       lysine 500 mg Tab Take by mouth every evening       multivitamin tablet Take 1 tablet by mouth every evening       omeprazole (PRILOSEC OTC) 20 MG EC tablet Take 20 mg by mouth every morning       zinc 50 mg Tab Take by mouth every evening        No current facility-administered medications on file prior to visit.           Family History  Problem Relation Age of Onset   Diabetes  Mother     Parkinsonism Father     Multiple myeloma Brother        Social History       Tobacco Use  Smoking Status Never Smoker  Smokeless Tobacco Never Used      Social History         Socioeconomic History   Marital status: Single   Number of children: 3  Occupational History   Occupation: retired      Comment: watches grandchildren  Tobacco Use   Smoking status: Never Smoker   Smokeless tobacco: Never Used  Scientific laboratory technician Use: Never used      Objective:      BP 134/78   Pulse 91   Temp 98.2 F (36.8 C) (Oral)   Resp 16   SpO2 98%     Exam Gen: NAD CV: RRR Lungs: CTA Abd: soft Rectal: Mobile posterior midline rectal mass approximately 6 to 8 cm from the anal verge well above the level of the coccyx.     Labs, Imaging and Diagnostic Testing: MRI: unable to visualize tumor well due to no rectal contrast   Assessment and Plan:  Diagnoses and all orders for this visit:   Malignant neoplasm of rectum  (CMS-HCC)   Other orders -     polyethylene glycol (MIRALAX) powder; Take 233.75 g by mouth once for 1 dose Take according to your procedure prep instructions. -     bisacodyL (DULCOLAX) 5 mg EC tablet; Take 4 tablets (20 mg total) by mouth once daily as needed for Constipation for up to 1 dose -     metroNIDAZOLE (FLAGYL) 500 MG tablet; Take 1 tablet (500 mg total) by mouth 3 (three) times daily for 3 doses Take according to your procedure colon prep instructions -     neomycin 500 mg tablet; Take 2 tablets (1,000 mg total) by mouth 3 (three) times daily for 3 doses Take according to your procedure colon prep instructions     Patient appears to have a mid to distal posterior midline rectal tumor.  This appears to be approximately 6 to 8 cm from the anal verge on exam today.  I have recommended low anterior resection with diverting ileostomy.  This was discussed in detail with the patient and all questions were answered.       The surgery and anatomy were described to the patient as well as the risks of surgery and the possible complications.  These include: Bleeding, deep abdominal infections and possible wound complications such as hernia and infection, damage to adjacent structures, leak of surgical connections, which can lead to other surgeries and possibly an ostomy, possible need for other procedures, such as abscess drains in radiology, possible prolonged hospital stay, possible diarrhea from removal of part of the colon, possible constipation from narcotics, possible bowel, bladder or sexual dysfunction if having rectal surgery, prolonged fatigue/weakness or appetite loss, possible early recurrence of of disease, possible complications of their medical problems such as heart disease or arrhythmias or lung problems, death (less than 1%). I believe the patient understands and wishes to proceed with the surgery.

## 2020-11-05 NOTE — Op Note (Signed)
11/05/2020  12:00 PM  PATIENT:  Cheyenne Scott  65 y.o. female  Patient Care Team: Irwin Brakeman Higinio Roger, [place] as PCP - General (Endocrinology) [name], RN (Inactive) as Oncology Nurse Navigator [name], MD as Consulting Physician (Hematology and Oncology)  PRE-OPERATIVE DIAGNOSIS:  RECTAL CANCER  POST-OPERATIVE DIAGNOSIS:  RECTAL CANCER  PROCEDURE:  Procedure(s): XI ROBOTIC ASSISTED LOWER ANTERIOR RESECTION DIVERTING ILEOSTOMY  SURGEON:  Surgeon(s): Leighton Ruff, MD Michael Boston, MD  ASSISTANT: Dr Johney Maine   ANESTHESIA:   local and general  EBL: 22ml  Total I/O In: 2100 [I.V.:2000; IV Piggyback:100] Out: 15 [Blood:15]  Delay start of Pharmacological VTE agent (>24hrs) due to surgical blood loss or risk of bleeding:  no  DRAINS: (80F) Jackson-Pratt drain(s) with closed bulb suction in the pelvis    SPECIMEN:  Source of Specimen:  rectum, final distal margin  DISPOSITION OF SPECIMEN:  PATHOLOGY  COUNTS:  YES  PLAN OF CARE: Admit to inpatient   PATIENT DISPOSITION:  PACU - hemodynamically stable.  INDICATION:    65 y.o. F with distal rectal cancer.   I recommended segmental resection:  The anatomy & physiology of the digestive tract was discussed.  The pathophysiology was discussed.  Natural history risks without surgery was discussed.   I worked to give an overview of the disease and the frequent need to have multispecialty involvement.  I feel the risks of no intervention will lead to serious problems that outweigh the operative risks; therefore, I recommended a partial colectomy to remove the pathology.  Laparoscopic & open techniques were discussed.   Risks such as bleeding, infection, abscess, leak, reoperation, possible ostomy, hernia, heart attack, death, and other risks were discussed.  I noted a good likelihood this will help address the problem.   Goals of post-operative recovery were discussed as well.    The patient expressed  understanding & wished to proceed with surgery.  OR FINDINGS:   Patient had no obvious scar or tattoo.  Some thickening of the anterior distal rectum noted on DRE  No obvious metastatic disease on visceral parietal peritoneum or liver.  The anastomosis rests 1 cm from the anal sphincters by rigid DRE.  DESCRIPTION:   Informed consent was confirmed.  The patient underwent general anaesthesia without difficulty.  The patient was positioned appropriately.  VTE prevention in place.  The patient's abdomen was clipped, prepped, & draped in a sterile fashion.  Surgical timeout confirmed our plan.  The patient was positioned in reverse Trendelenburg.  Abdominal entry was gained using a Varies needle in the LUQ.  Entry was clean.  I induced carbon dioxide insufflation.  An 7mm robotic port was placed in the RUQ.  Camera inspection revealed no injury.  Extra ports were carefully placed under direct laparoscopic visualization.  I laparoscopically reflected the greater omentum and the upper abdomen the small bowel in the upper abdomen. The patient was appropriately positioned and the robot was docked to the patient's left side.  Instruments were placed under direct visualization.    I mobilized the sigmoid colon off of the pelvic sidewall.  I scored the base of peritoneum of the right side of the mesentery of the left colon from the ligament of Treitz to the peritoneal reflection of the mid rectum.  The patient had some anterior scar noted at the peritoneal reflection.  I elevated the sigmoid mesentery and enetered into the retro-mesenteric plane. We were able to identify the left ureter and gonadal vessels. We kept  those posterior within the retroperitoneum and elevated the left colon mesentery off that. I did isolated IMA pedicle but did not ligate it yet.  I continued distally and got into the avascular plane posterior to the mesorectum. This allowed me to help mobilize the rectum as well by freeing the  mesorectum off the sacrum.  I mobilized the peritoneal coverings towards the peritoneal reflection on both the right and left sides of the rectum.  I could see the right and left ureters and stayed away from them.     I skeletonized the inferior mesenteric artery pedicle.  I went down to its takeoff from the aorta.   I isolated the inferior mesenteric vein off of the ligament of Treitz just cephalad to that as well.  After confirming the left ureter was out of the way, I went ahead and ligated the inferior mesenteric artery pedicle with bipolar robotic vessel sealer ~2cm above its takeoff from the aorta.  I did ligate the inferior mesenteric vein in a similar fashion.  We ensured hemostasis. I mobilized the left colon in a lateral to medial fashion off the line of Toldt up towards the splenic flexure to ensure good mobilization of the left colon to reach into the pelvis.  I then divided the posterior mesorectal plane using the robotic vessel sealer and blunt dissection.  I continued this down to the pelvic floor.  The peritoneal reflection was opened on either side and divided in the middle using the robotic vessel sealer.  I then entered into the anterior mesorectal plane.  There were significant adhesions in this area due to her previous surgery.  I continued down into the radiated field anteriorly.  Blunt dissection was used to divide the vaginal wall posteriorly from the anterior rectum.  There were dense adhesions noted in the distal anterior plane.  These were taken down using robotic scissors.  A small cuff of vaginal tissue was taken to ensure good margins.  This was closed using a running 2 OV lock suture and interrupted 2-0 silk sutures to oversew the area.  Once I was down to the sphincter complex on all sides I visualized the entire rectum using a colonoscope.  No tattoo was noted.  No scar was noted.  The entire rectum was without any defect which could note a previous mass.  On digital rectal exam  there was some scarring noted approximately 3 cm from the anal verge anteriorly.  This was felt to be the best option for her tumor location.  We divided the rectum distal to this using 2 45 mm green load robotic staplers.  Once this was complete, the mesentery was divided with the robotic vessel sealer up to the level of the descending/sigmoid colon junction.  This easily reached into the pelvis.  We did confirm vascular perfusion with firefly and injected intravenously.  There was good perfusion of the entire descending colon.  There was good perfusion of the rectal stump and vaginal wall.  At this point we enlarged the 12 mm port that was previously placed through the ostomy marking and placed a wound protector and in this area.  The specimen was brought out and divided over a pursestring device.  A 2-0 Prolene pursestring was placed.  A 29 mm anvil was placed into the colon and the pursestring was tied tightly around this.  This was then placed back into the abdomen and the specimen was sent to pathology for further examination.  Under laparoscopic visualization's a coloanal  anastomosis was created using a EEA stapler.  Visualization using the colonoscope showed no leak or bleeding.  A rectal block was placed using Marcaine mixed with Exparel.  The vaginal wall was evaluated externally.  It appeared well closed.  It sits just distal to the rectal anastomosis.  At this point a 11 [place] Blake drain was placed into the pelvis and brought out through the right lower quadrant port site.  This was secured with a Prolene suture.  The terminal ileum was brought out through the previously dissected ostomy site.  It was oriented and appropriate position and the wound protector and ports were removed.  The ostomy was matured in standard [place] fashion using interrupted 2-0 Vicryl sutures.  Dermabond and 4-0 Vicryl subcuticular sutures were used to close the port sites.  Appropriate dressings were placed and the patient  was awakened from anesthesia and sent to the postanesthesia care unit in stable condition.  All counts were correct per operating room staff.   An MD assistant was necessary for tissue manipulation, retraction and positioning due to the complexity of the case and hospital policies

## 2020-11-05 NOTE — Anesthesia Postprocedure Evaluation (Signed)
Anesthesia Post Note  Patient: Cheyenne Scott  Procedure(s) Performed: XI ROBOTIC ASSISTED LOWER ANTERIOR RESECTION (Abdomen) DIVERTING ILEOSTOMY (Abdomen)     Patient location during evaluation: PACU Anesthesia Type: General Level of consciousness: sedated Pain management: pain level controlled Vital Signs Assessment: post-procedure vital signs reviewed and stable Respiratory status: spontaneous breathing and respiratory function stable Cardiovascular status: stable Postop Assessment: no apparent nausea or vomiting Anesthetic complications: no   No notable events documented.  Last Vitals:  Vitals:   11/05/20 1400 11/05/20 1415  BP: (!) 118/57 (!) 108/57  Pulse: 61 73  Resp: (!) 8 15  Temp:    SpO2: 100% 100%    Last Pain:  Vitals:   11/05/20 1415  TempSrc:   PainSc: Asleep                 Redell Bhandari DANIEL

## 2020-11-05 NOTE — Anesthesia Procedure Notes (Signed)
Procedure Name: Intubation Date/Time: 11/05/2020 8:44 AM Performed by: Gerald Leitz, CRNA Pre-anesthesia Checklist: Patient identified, Patient being monitored, Timeout performed, Emergency Drugs available and Suction available Patient Re-evaluated:Patient Re-evaluated prior to induction Oxygen Delivery Method: Circle system utilized Preoxygenation: Pre-oxygenation with 100% oxygen Induction Type: IV induction Ventilation: Mask ventilation without difficulty Laryngoscope Size: Mac and 3 Grade View: Grade I Tube type: Oral Tube size: 7.0 mm Number of attempts: 1 Placement Confirmation: ETT inserted through vocal cords under direct vision, positive ETCO2 and breath sounds checked- equal and bilateral Secured at: 21 cm Tube secured with: Tape Dental Injury: Teeth and Oropharynx as per pre-operative assessment

## 2020-11-06 ENCOUNTER — Encounter (HOSPITAL_COMMUNITY): Payer: Self-pay | Admitting: General Surgery

## 2020-11-06 LAB — BASIC METABOLIC PANEL
Anion gap: 6 (ref 5–15)
BUN: 9 mg/dL (ref 8–23)
CO2: 25 mmol/L (ref 22–32)
Calcium: 8.6 mg/dL — ABNORMAL LOW (ref 8.9–10.3)
Chloride: 106 mmol/L (ref 98–111)
Creatinine, Ser: 0.75 mg/dL (ref 0.44–1.00)
GFR, Estimated: >60 mL/min (ref >60)
Glucose, Bld: 196 mg/dL — ABNORMAL HIGH (ref 70–99)
Potassium: 3.6 mmol/L (ref 3.5–5.1)
Sodium: 137 mmol/L (ref 135–145)

## 2020-11-06 LAB — CBC
HCT: 29.9 % — ABNORMAL LOW (ref 36.0–46.0)
Hemoglobin: 10 g/dL — ABNORMAL LOW (ref 12.0–15.0)
MCH: 32.2 pg (ref 26.0–34.0)
MCHC: 33.4 g/dL (ref 30.0–36.0)
MCV: 96.1 fL (ref 80.0–100.0)
Platelets: 163 10*3/uL (ref 150–400)
RBC: 3.11 MIL/uL — ABNORMAL LOW (ref 3.87–5.11)
RDW: 11.8 % (ref 11.5–15.5)
WBC: 8.9 10*3/uL (ref 4.0–10.5)
nRBC: 0 % (ref 0.0–0.2)

## 2020-11-06 MED ORDER — CHLORHEXIDINE GLUCONATE CLOTH 2 % EX PADS
6.0000 | MEDICATED_PAD | Freq: Every day | CUTANEOUS | Status: DC
Start: 2020-11-06 — End: 2020-11-09
  Administered 2020-11-06 – 2020-11-07 (×2): 6 via TOPICAL

## 2020-11-06 NOTE — Progress Notes (Signed)
1 Day Post-Op Robotic LAR, diverting ileostomy Subjective: Doing well, out of be in a chair.  Pain controlled.  Tolerating liquids with good ostomy function  Objective: Vital signs in last 24 hours: Temp:  [97.4 F (36.3 C)-98 F (36.7 C)] 98 F (36.7 C) (09/29 0931) Pulse Rate:  [54-75] 68 (09/29 0931) Resp:  [8-19] 18 (09/29 0931) BP: (94-131)/(49-76) 121/53 (09/29 0931) SpO2:  [97 %-100 %] 99 % (09/29 0931) Weight:  [72.6 kg-75.8 kg] 75.8 kg (09/29 0500)   Intake/Output from previous day: 09/28 0701 - 09/29 0700 In: 3905.4 [P.O.:420; I.V.:3385.4; IV Piggyback:100] Out: 2229 [Urine:2500; Drains:270; Blood:365] Intake/Output this shift: No intake/output data recorded.   General appearance: alert and cooperative GI: soft, appropriately tender, non-distended Ostomy pink and viable JP: SS fluid Incision: no significant drainage  Lab Results:  Recent Labs    11/06/20 0406  WBC 8.9  HGB 10.0*  HCT 29.9*  PLT 163   BMET Recent Labs    11/06/20 0406  NA 137  K 3.6  CL 106  CO2 25  GLUCOSE 196*  BUN 9  CREATININE 0.75  CALCIUM 8.6*   PT/INR No results for input(s): LABPROT, INR in the last 72 hours. ABG No results for input(s): PHART, HCO3 in the last 72 hours.  Invalid input(s): PCO2, PO2  MEDS, Scheduled  acetaminophen  1,000 mg Oral Q6H   alvimopan  12 mg Oral BID   Chlorhexidine Gluconate Cloth  6 each Topical Daily   enoxaparin (LOVENOX) injection  40 mg Subcutaneous Q24H   feeding supplement  237 mL Oral BID BM   gabapentin  300 mg Oral BID   saccharomyces boulardii  250 mg Oral BID    Studies/Results: No results found.  Assessment: s/p Procedure(s): XI ROBOTIC ASSISTED LOWER ANTERIOR RESECTION DIVERTING ILEOSTOMY Patient Active Problem List   Diagnosis Date Noted   Rectal cancer ([place]) 11/05/2020   Encounter for antineoplastic chemotherapy 06/25/2020   Thrombocytopenia ([place]) 04/30/2020   Neutropenia ([place]) 04/30/2020   Hypokalemia  04/30/2020   Hand foot syndrome 04/30/2020   Port-A-Cath in place 04/02/2020   Rectal adenocarcinoma ([place]) 02/21/2020   Pelvic relaxation disorder 01/20/2015    Expected post op course  Plan: Advance diet SL IVF's for now Monitor ostomy output Ambulate in hall Cont ostomy teaching    LOS: 1 day     .Rosario Adie, MD [facility], [place]    11/06/2020 10:45 AM

## 2020-11-06 NOTE — Consult Note (Signed)
[place] Nurse ostomy follow up Patient receiving care in [place] [facility]. Patient sitting in room recliner at time of my visit. Stoma type/location: RUQ loop ileostomy with red rubber catheter support loop Stomal assessment/size: 1 3/8 inches, round, moist, edematous, sutures intact Peristomal assessment: deferred until first pouch change tomorrow morning. Scheduled for [phone] with daughter-in-law, who is a nurse Treatment options for stomal/peristomal skin: barrier ring. Plan is to place patient in a 2 piece flat pouching system with barrier ring tomorrow.  I will also show her the one piece convex pouch (which she currently has on) to demonstrate the difference. Output: The patient emptied 75 ml of thin green effluent from the existing pouch without difficulty.  Ostomy pouching: 1pc./2pc.  Education provided: We briefly reviewed the materials in the [place] education folder, discussed sample pouch and barrier ring, and the concept of crusting. Enrolled patient in [place] Discharge program: Yes, today.  Val Riles, RN, MSN, CWOCN, CNS-BC, pager [phone]

## 2020-11-07 LAB — CBC
HCT: 29 % — ABNORMAL LOW (ref 36.0–46.0)
Hemoglobin: 9.7 g/dL — ABNORMAL LOW (ref 12.0–15.0)
MCH: 32.8 pg (ref 26.0–34.0)
MCHC: 33.4 g/dL (ref 30.0–36.0)
MCV: 98 fL (ref 80.0–100.0)
Platelets: 156 10*3/uL (ref 150–400)
RBC: 2.96 MIL/uL — ABNORMAL LOW (ref 3.87–5.11)
RDW: 12.1 % (ref 11.5–15.5)
WBC: 5.8 10*3/uL (ref 4.0–10.5)
nRBC: 0 % (ref 0.0–0.2)

## 2020-11-07 LAB — BASIC METABOLIC PANEL
Anion gap: 6 (ref 5–15)
BUN: 15 mg/dL (ref 8–23)
CO2: 26 mmol/L (ref 22–32)
Calcium: 9.1 mg/dL (ref 8.9–10.3)
Chloride: 109 mmol/L (ref 98–111)
Creatinine, Ser: 0.8 mg/dL (ref 0.44–1.00)
GFR, Estimated: >60 mL/min (ref >60)
Glucose, Bld: 97 mg/dL (ref 70–99)
Potassium: 3.5 mmol/L (ref 3.5–5.1)
Sodium: 141 mmol/L (ref 135–145)

## 2020-11-07 NOTE — Consult Note (Signed)
[place] Nurse ostomy follow up Patient receiving care in [place] [address]. Daughter-in-law Magda Paganini, present for ostomy care session. Stoma type/location: RUQ loop ileostomy Stomal assessment/size: 1 3/8 inches, round, moist, support loop in place. Patient tells me the surgeon explained the loop would be removed before she goes home. Peristomal assessment: intact Treatment options for stomal/peristomal skin: barrier ring.  Crusting demonstrated to patient and family using stoma powder and barrier film wipe. Output: thin brown  Ostomy pouching: 2pc. Flat, 2 3/4 inch skin barrier, pouch, and barrier ring used Education provided: The patient emptied the existing pouch. With my guidance she removed the pouch, cleaned around the stoma, and participated in all aspects of the pouching process.  She cut the new skin barrier opening, applied the pouching components. She asked many important and thoughtful questions during the session.  I answered them to her expressed satisfaction. Enrolled patient in [place] Discharge program: Yes  I have asked for the [place] to order additional pouches, skin barriers, and barrier rings to go in her room, and placed an order to send all ostomy care supplies home with her when she is discharged.  Val Riles, RN, MSN, CWOCN, CNS-BC, pager [phone]

## 2020-11-07 NOTE — Progress Notes (Signed)
2 Days Post-Op Robotic LAR, diverting ileostomy Subjective: Doing well, out of be in a chair.  Had some vaginal drainage when walking in the hall yesterday.  Pain controlled.  Tolerating soft foods with good ostomy function  Objective: Vital signs in last 24 hours: Temp:  [98 F (36.7 C)-98.3 F (36.8 C)] 98.3 F (36.8 C) (09/30 0604) Pulse Rate:  [66-78] 66 (09/30 0604) Resp:  [18] 18 (09/30 0604) BP: (99-121)/(45-53) 99/53 (09/30 0604) SpO2:  [98 %-99 %] 98 % (09/30 0604) Weight:  [75.2 kg] 75.2 kg (09/30 0500)   Intake/Output from previous day: 09/29 0701 - 09/30 0700 In: 73 [P.O.:420] Out: 2645 [Urine:2150; Drains:170; Stool:325] Intake/Output this shift: No intake/output data recorded.   General appearance: alert and cooperative GI: soft, appropriately tender, non-distended Ostomy pink and viable JP: SS fluid Incision: no significant drainage  Lab Results:  Recent Labs    11/06/20 0406 11/07/20 0437  WBC 8.9 5.8  HGB 10.0* 9.7*  HCT 29.9* 29.0*  PLT 163 156    BMET Recent Labs    11/06/20 0406 11/07/20 0437  NA 137 141  K 3.6 3.5  CL 106 109  CO2 25 26  GLUCOSE 196* 97  BUN 9 15  CREATININE 0.75 0.80  CALCIUM 8.6* 9.1    PT/INR No results for input(s): LABPROT, INR in the last 72 hours. ABG No results for input(s): PHART, HCO3 in the last 72 hours.  Invalid input(s): PCO2, PO2  MEDS, Scheduled  acetaminophen  1,000 mg Oral Q6H   alvimopan  12 mg Oral BID   Chlorhexidine Gluconate Cloth  6 each Topical Daily   enoxaparin (LOVENOX) injection  40 mg Subcutaneous Q24H   feeding supplement  237 mL Oral BID BM   gabapentin  300 mg Oral BID   saccharomyces boulardii  250 mg Oral BID    Studies/Results: No results found.  Assessment: s/p Procedure(s): XI ROBOTIC ASSISTED LOWER ANTERIOR RESECTION DIVERTING ILEOSTOMY Patient Active Problem List   Diagnosis Date Noted   Rectal cancer ([place]) 11/05/2020   Encounter for antineoplastic  chemotherapy 06/25/2020   Thrombocytopenia ([place]) 04/30/2020   Neutropenia ([place]) 04/30/2020   Hypokalemia 04/30/2020   Hand foot syndrome 04/30/2020   Port-A-Cath in place 04/02/2020   Rectal adenocarcinoma ([place]) 02/21/2020   Pelvic relaxation disorder 01/20/2015    Expected post op course  Plan: Advance diet SL IVF's for now Monitor ostomy output Cont to ambulate in hall Cont ostomy teaching    LOS: 2 days     .Rosario Adie, MD [facility], [place]    11/07/2020 8:34 AM

## 2020-11-07 NOTE — Progress Notes (Signed)
   11/07/20 1500  Mobility  Activity Ambulated in hall  Level of Assistance Standby assist, set-up cues, supervision of patient - no hands on  Assistive Device None  Distance Ambulated (ft) 500 ft  Mobility Ambulated with assistance in hallway  Mobility Response Tolerated well  Mobility performed by Mobility specialist  $Mobility charge 1 Mobility   Pt eager to mobilize. Ambulated in hall about 549ft with no device, tolerated well. Pt noted that she does "leak" from her wound sometimes, but did not occur during this session. Pt left in chair with son present and call bell at side. RN notified of session.    [place] Specialist Acute Rehab Services Office: [phone]

## 2020-11-07 NOTE — TOC Initial Note (Signed)
Transition of Care [facility]) - Initial/Assessment Note    Patient Details  Name: Cheyenne Scott MRN: [id number] Date of Birth: [birth year]  [facility] CM/SW Contact:    Lennart Pall, LCSW Phone Number: 11/07/2020, 2:52 PM  Clinical Narrative:                 Met briefly with pt to introduce TOC role with dc planning.  Order received for [facility] to assist with DME/ or follow needs.  Pt reports she anticipates probable dc on Monday.  She is working with Gila RN for ostomy teaching and is agreeable to arrangement of HHRN to follow up at d/c.  Will request orders be placed specifically for [facility] and will refer out. No other needs identified.  Expected Discharge Plan: [place] Barriers to Discharge: Continued Medical Work up   Patient Goals and CMS Choice Patient states their goals for this hospitalization and ongoing recovery are:: return home      Expected Discharge Plan and Services Expected Discharge Plan: [place]       Living arrangements for the past 2 months: Apartment                 DME Arranged: Ostomy supplies DME Agency: Other - Comment ([place] RN referred pt to [employer])                  Prior Living Arrangements/Services Living arrangements for the past 2 months: Apartment Lives with:: Self Patient language and need for interpreter reviewed:: Yes Do you feel safe going back to the place where you live?: Yes      Need for Family Participation in Patient Care: No (Comment) Care giver support system in place?: No (comment)   Criminal Activity/Legal Involvement Pertinent to Current Situation/Hospitalization: No - Comment as needed  Activities of Daily Living Home Assistive Devices/Equipment: None ADL Screening (condition at time of admission) Patient's cognitive ability adequate to safely complete daily activities?: Yes Is the patient deaf or have difficulty hearing?: No Does the patient have difficulty  seeing, even when wearing glasses/contacts?: No Does the patient have difficulty concentrating, remembering, or making decisions?: No Patient able to express need for assistance with ADLs?: Yes Does the patient have difficulty dressing or bathing?: No Independently performs ADLs?: Yes (appropriate for developmental age) Does the patient have difficulty walking or climbing stairs?: No Weakness of Legs: None Weakness of Arms/Hands: None  Permission Sought/Granted                  Emotional Assessment Appearance:: Appears stated age Attitude/Demeanor/Rapport: Gracious Affect (typically observed): Accepting Orientation: : Oriented to Self, Oriented to Place, Oriented to  Time, Oriented to Situation Alcohol / Substance Use: Not Applicable Psych Involvement: No (comment)  Admission diagnosis:  Rectal cancer ([place]) [C20] Patient Active Problem List   Diagnosis Date Noted   Rectal cancer ([place]) 11/05/2020   Encounter for antineoplastic chemotherapy 06/25/2020   Thrombocytopenia ([place]) 04/30/2020   Neutropenia ([place]) 04/30/2020   Hypokalemia 04/30/2020   Hand foot syndrome 04/30/2020   Port-A-Cath in place 04/02/2020   Rectal adenocarcinoma ([place]) 02/21/2020   Pelvic relaxation disorder 01/20/2015   PCP:  Beverley Fiedler, FNP Pharmacy:   [id number] [place], [place] - [address] AT [facility] [address] [place] [address] Phone: [phone] Fax: [phone]  [place] [address]  Phone: [phone] Fax: [phone]     Social Determinants of Health (SDOH) Interventions    Readmission Risk Interventions No flowsheet data found.

## 2020-11-07 NOTE — Care Management Important Message (Signed)
Medicare IM printed for Social Work at [facility] to be given to the patient 

## 2020-11-07 NOTE — Progress Notes (Signed)
Pharmacy Brief Note - Alvimopan (Entereg)  The standing order set for alvimopan (Entereg) now includes an automatic order to discontinue the drug after the patient has had a bowel movement. The change was approved by the [facility] and the Medical Executive Committee.   Pt's RN documented that she's having ostomy ouput. Therefore, alvimopan has been discontinued. If there are questions, please contact the pharmacy at [phone].   Thank you-   Dia Sitter, PharmD, BCPS 11/07/2020 9:54 AM

## 2020-11-08 LAB — BASIC METABOLIC PANEL
Anion gap: 5 (ref 5–15)
BUN: 13 mg/dL (ref 8–23)
CO2: 28 mmol/L (ref 22–32)
Calcium: 8.7 mg/dL — ABNORMAL LOW (ref 8.9–10.3)
Chloride: 108 mmol/L (ref 98–111)
Creatinine, Ser: 0.75 mg/dL (ref 0.44–1.00)
GFR, Estimated: >60 mL/min (ref >60)
Glucose, Bld: 93 mg/dL (ref 70–99)
Potassium: 3.4 mmol/L — ABNORMAL LOW (ref 3.5–5.1)
Sodium: 141 mmol/L (ref 135–145)

## 2020-11-08 NOTE — Progress Notes (Signed)
Pt c/o's burning w urination. Stated that she was using barrier cream on her perineal area, instructed to not do this to her fresh incisions. MD notified of above and will cont to monitor.

## 2020-11-08 NOTE — Progress Notes (Signed)
3 Days Post-Op   Subjective/Chief Complaint: No complaints this am, up and moving around, tol diet, sitting in chair when I see her   Objective: Vital signs in last 24 hours: Temp:  [97.7 F (36.5 C)-98 F (36.7 C)] 98 F (36.7 C) (10/01 0505) Pulse Rate:  [70-74] 71 (10/01 0505) Resp:  [15-16] 16 (10/01 0505) BP: (98-124)/(52-61) 116/54 (10/01 0505) SpO2:  [97 %-100 %] 97 % (10/01 0505)    Intake/Output from previous day: 09/30 0701 - 10/01 0700 In: 960 [P.O.:960] Out: 4050 [Urine:3500; Drains:100; Stool:450] Intake/Output this shift: Total I/O In: 0  Out: 450 [Urine:400; Stool:50]  General appearance: no distress Resp: clear to auscultation bilaterally Cardio: regular rate and rhythm GI: soft approp tender jp serosan, ostomy pink and functional  Lab Results:  Recent Labs    11/06/20 0406 11/07/20 0437  WBC 8.9 5.8  HGB 10.0* 9.7*  HCT 29.9* 29.0*  PLT 163 156   BMET Recent Labs    11/07/20 0437 11/08/20 0447  NA 141 141  K 3.5 3.4*  CL 109 108  CO2 26 28  GLUCOSE 97 93  BUN 15 13  CREATININE 0.80 0.75  CALCIUM 9.1 8.7*   PT/INR No results for input(s): LABPROT, INR in the last 72 hours. ABG No results for input(s): PHART, HCO3 in the last 72 hours.  Invalid input(s): PCO2, PO2  Studies/Results: No results found.  Anti-infectives: Anti-infectives (From admission, onward)    Start     Dose/Rate Route Frequency Ordered Stop   11/05/20 0645  cefoTEtan (CEFOTAN) 2 g in sodium chloride 0.9 % 100 mL IVPB        2 g 200 mL/hr over 30 Minutes Intravenous On call to O.R. 11/05/20 0635 11/05/20 1622       Assessment/Plan: POD 3 LAR, diverting ileostomy -replace k today -regular diet -pulm toilet -home Monday after ostomy teaching -lovenox   Rolm Bookbinder 11/08/2020

## 2020-11-09 ENCOUNTER — Encounter (HOSPITAL_COMMUNITY): Payer: Self-pay | Admitting: General Surgery

## 2020-11-09 LAB — URINALYSIS, ROUTINE W REFLEX MICROSCOPIC
Bilirubin Urine: NEGATIVE
Glucose, UA: NEGATIVE mg/dL
Ketones, ur: NEGATIVE mg/dL
Nitrite: NEGATIVE
Protein, ur: NEGATIVE mg/dL
Specific Gravity, Urine: 1.001 — ABNORMAL LOW (ref 1.005–1.030)
pH: 6 (ref 5.0–8.0)

## 2020-11-09 NOTE — Progress Notes (Signed)
4 Days Post-Op   Subjective/Chief Complaint: Some burning after foley out persists, tol diet, no other issues   Objective: Vital signs in last 24 hours: Temp:  [98.2 F (36.8 C)-98.5 F (36.9 C)] 98.5 F (36.9 C) (10/01 2215) Pulse Rate:  [69-72] 72 (10/01 2215) Resp:  [15] 15 (10/01 1356) BP: (120-122)/(54-60) 122/60 (10/01 2215) SpO2:  [98 %-99 %] 98 % (10/01 2215)    Intake/Output from previous day: 10/01 0701 - 10/02 0700 In: 1320 [P.O.:1320] Out: 2050 [Urine:1400; Drains:100; Stool:550] Intake/Output this shift: Total I/O In: -  Out: 290 [Urine:150; Drains:40; Stool:100]  General appearance: no distress Resp: clear to auscultation bilaterally Cardio: regular rate and rhythm GI: soft approp tender jp serosan, ostomy pink and functional    Lab Results:  Recent Labs    11/07/20 0437  WBC 5.8  HGB 9.7*  HCT 29.0*  PLT 156   BMET Recent Labs    11/07/20 0437 11/08/20 0447  NA 141 141  K 3.5 3.4*  CL 109 108  CO2 26 28  GLUCOSE 97 93  BUN 15 13  CREATININE 0.80 0.75  CALCIUM 9.1 8.7*   PT/INR No results for input(s): LABPROT, INR in the last 72 hours. ABG No results for input(s): PHART, HCO3 in the last 72 hours.  Invalid input(s): PCO2, PO2  Studies/Results: No results found.  Anti-infectives: Anti-infectives (From admission, onward)    Start     Dose/Rate Route Frequency Ordered Stop   11/05/20 0645  cefoTEtan (CEFOTAN) 2 g in sodium chloride 0.9 % 100 mL IVPB        2 g 200 mL/hr over 30 Minutes Intravenous On call to O.R. 11/05/20 0635 11/05/20 1622       Assessment/Plan: POD 4 LAR, diverting ileostomy -will check ua due to persistent issues but likely no infection -regular diet -pulm toilet -home Monday after ostomy teaching -lovenox  Rolm Bookbinder 11/09/2020

## 2020-11-10 LAB — SURGICAL PATHOLOGY

## 2020-11-10 MED ORDER — TRAMADOL HCL 50 MG PO TABS: 50.0000 mg | ORAL_TABLET | Freq: Four times a day (QID) | ORAL | 0 refills | Status: AC | PRN

## 2020-11-10 NOTE — Discharge Instructions (Signed)
SURGERY: POST OP INSTRUCTIONS (Surgery for small bowel obstruction, colon resection, etc)   ######################################################################  EAT Gradually transition to a high fiber diet with a fiber supplement over the next few days after discharge  WALK Walk an hour a day.  Control your pain to do that.    CONTROL PAIN Control pain so that you can walk, sleep, tolerate sneezing/coughing, go up/down stairs.  HAVE A BOWEL MOVEMENT DAILY Keep your bowels regular to avoid problems.  OK to try a laxative to override constipation.  OK to use an antidairrheal to slow down diarrhea.  Call if not better after 2 tries  CALL IF YOU HAVE PROBLEMS/CONCERNS Call if you are still struggling despite following these instructions. Call if you have concerns not answered by these instructions  ######################################################################   DIET Follow a light diet the first few days at home.  Start with a bland diet such as soups, liquids, starchy foods, low fat foods, etc.  If you feel full, bloated, or constipated, stay on a ful liquid or pureed/blenderized diet for a few days until you feel better and no longer constipated. Be sure to drink plenty of fluids every day to avoid getting dehydrated (feeling dizzy, not urinating, etc.). Gradually add a fiber supplement to your diet over the next week.  Gradually get back to a regular solid diet.  Avoid fast food or heavy meals the first week as you are more likely to get nauseated. It is expected for your digestive tract to need a few months to get back to normal.  It is common for your bowel movements and stools to be irregular.  You will have occasional bloating and cramping that should eventually fade away.  Until you are eating solid food normally, off all pain medications, and back to regular activities; your bowels will not be normal. Focus on eating a low-fat, high fiber diet the rest of your life  (See Getting to Good Bowel Health, below).  CARE of your INCISION or WOUND  It is good for closed incisions and even open wounds to be washed every day.  Shower every day.  Short baths are fine.  Wash the incisions and wounds clean with soap & water.    You may leave closed incisions open to air if it is dry.   You may cover the incision with clean gauze & replace it after your daily shower for comfort.  STAPLES: You have skin staples.  Leave them in place & set up an appointment for them to be removed by a surgery office nurse ~10 days after surgery. = 1st week of January 2024    ACTIVITIES as tolerated Start light daily activities --- self-care, walking, climbing stairs-- beginning the day after surgery.  Gradually increase activities as tolerated.  Control your pain to be active.  Stop when you are tired.  Ideally, walk several times a day, eventually an hour a day.   Most people are back to most day-to-day activities in a few weeks.  It takes 4-8 weeks to get back to unrestricted, intense activity. If you can walk 30 minutes without difficulty, it is safe to try more intense activity such as jogging, treadmill, bicycling, low-impact aerobics, swimming, etc. Save the most intensive and strenuous activity for last (Usually 4-8 weeks after surgery) such as sit-ups, heavy lifting, contact sports, etc.  Refrain from any intense heavy lifting or straining until you are off narcotics for pain control.  You will have off days, but things should improve   week-by-week. DO NOT PUSH THROUGH PAIN.  Let pain be your guide: If it hurts to do something, don't do it.  Pain is your body warning you to avoid that activity for another week until the pain goes down. You may drive when you are no longer taking narcotic prescription pain medication, you can comfortably wear a seatbelt, and you can safely make sudden turns/stops to protect yourself without hesitating due to pain. You may have sexual intercourse when it  is comfortable. If it hurts to do something, stop.  MEDICATIONS Take your usually prescribed home medications unless otherwise directed.   Blood thinners:  Usually you can restart any strong blood thinners after the second postoperative day.  It is OK to take aspirin right away.     If you are on strong blood thinners (warfarin/Coumadin, Plavix, Xerelto, Eliquis, Pradaxa, etc), discuss with your surgeon, medicine PCP, and/or cardiologist for instructions on when to restart the blood thinner & if blood monitoring is needed (PT/INR blood check, etc).     PAIN CONTROL Pain after surgery or related to activity is often due to strain/injury to muscle, tendon, nerves and/or incisions.  This pain is usually short-term and will improve in a few months.  To help speed the process of healing and to get back to regular activity more quickly, DO THE FOLLOWING THINGS TOGETHER: Increase activity gradually.  DO NOT PUSH THROUGH PAIN Use Ice and/or Heat Try Gentle Massage and/or Stretching Take over the counter pain medication Take Narcotic prescription pain medication for more severe pain  Good pain control = faster recovery.  It is better to take more medicine to be more active than to stay in bed all day to avoid medications.  Increase activity gradually Avoid heavy lifting at first, then increase to lifting as tolerated over the next 6 weeks. Do not "push through" the pain.  Listen to your body and avoid positions and maneuvers than reproduce the pain.  Wait a few days before trying something more intense Walking an hour a day is encouraged to help your body recover faster and more safely.  Start slowly and stop when getting sore.  If you can walk 30 minutes without stopping or pain, you can try more intense activity (running, jogging, aerobics, cycling, swimming, treadmill, sex, sports, weightlifting, etc.) Remember: If it hurts to do it, then don't do it! Use Ice and/or Heat You will have swelling and  bruising around the incisions.  This will take several weeks to resolve. Ice packs or heating pads (6-8 times a day, 30-60 minutes at a time) will help sooth soreness & bruising. Some people prefer to use ice alone, heat alone, or alternate between ice & heat.  Experiment and see what works best for you.  Consider trying ice for the first few days to help decrease swelling and bruising; then, switch to heat to help relax sore spots and speed recovery. Shower every day.  Short baths are fine.  It feels good!  Keep the incisions and wounds clean with soap & water.   Try Gentle Massage and/or Stretching Massage at the area of pain many times a day Stop if you feel pain - do not overdo it Take over the counter pain medication This helps the muscle and nerve tissues become less irritable and calm down faster Choose ONE of the following over-the-counter anti-inflammatory medications: Acetaminophen 500mg tabs (Tylenol) 1-2 pills with every meal and just before bedtime (avoid if you have liver problems or if you have   acetaminophen in you narcotic prescription) Naproxen 220mg tabs (ex. Aleve, Naprosyn) 1-2 pills twice a day (avoid if you have kidney, stomach, IBD, or bleeding problems) Ibuprofen 200mg tabs (ex. Advil, Motrin) 3-4 pills with every meal and just before bedtime (avoid if you have kidney, stomach, IBD, or bleeding problems) Take with food/snack several times a day as directed for at least 2 weeks to help keep pain / soreness down & more manageable. Take Narcotic prescription pain medication for more severe pain A prescription for strong pain control is often given to you upon discharge (for example: oxycodone/Percocet, hydrocodone/Norco/Vicodin, or tramadol/Ultram) Take your pain medication as prescribed. Be mindful that most narcotic prescriptions contain Tylenol (acetaminophen) as well - avoid taking too much Tylenol. If you are having problems/concerns with the prescription medicine (does  not control pain, nausea, vomiting, rash, itching, etc.), please call us [phone] to see if we need to switch you to a different pain medicine that will work better for you and/or control your side effects better. If you need a refill on your pain medication, you must call the office before 4 pm and on weekdays only.  By federal law, prescriptions for narcotics cannot be called into a pharmacy.  They must be filled out on paper & picked up from our office by the patient or authorized caretaker.  Prescriptions cannot be filled after 4 pm nor on weekends.    WHEN TO CALL US [phone] Severe uncontrolled or worsening pain  Fever over 101 F (38.5 C) Concerns with the incision: Worsening pain, redness, rash/hives, swelling, bleeding, or drainage Reactions / problems with new medications (itching, rash, hives, nausea, etc.) Nausea and/or vomiting Difficulty urinating Difficulty breathing Worsening fatigue, dizziness, lightheadedness, blurred vision Other concerns If you are not getting better after two weeks or are noticing you are getting worse, contact our office [phone] for further advice.  We may need to adjust your medications, re-evaluate you in the office, send you to the emergency room, or see what other things we can do to help. The clinic staff is available to answer your questions during regular business hours (8:30am-5pm).  Please don't hesitate to call and ask to speak to one of our nurses for clinical concerns.    A surgeon from [facility] is always on call at the hospitals 24 hours/day If you have a medical emergency, go to the nearest emergency room or call 911.  FOLLOW UP in our office One the day of your discharge from the hospital (or the next business weekday), please call [facility] to set up or confirm an appointment to see your surgeon in the office for a follow-up appointment.  Usually it is 2-3 weeks after your surgery.   If you  have skin staples at your incision(s), let the office know so we can set up a time in the office for the nurse to remove them (usually around 10 days after surgery). Make sure that you call for appointments the day of discharge (or the next business weekday) from the hospital to ensure a convenient appointment time. IF YOU HAVE DISABILITY OR FAMILY LEAVE FORMS, BRING THEM TO THE OFFICE FOR PROCESSING.  DO NOT GIVE THEM TO YOUR DOCTOR.  [facility] [address], [address], [place], [place]  [address] ? [phone] - Main [phone] - Toll Free,  [phone] - Fax [email or web address]    GETTING TO GOOD BOWEL HEALTH. It is expected for your digestive tract to   need a few months to get back to normal.  It is common for your bowel movements and stools to be irregular.  You will have occasional bloating and cramping that should eventually fade away.  Until you are eating solid food normally, off all pain medications, and back to regular activities; your bowels will not be normal.   Avoiding constipation The goal: ONE SOFT BOWEL MOVEMENT A DAY!    Drink plenty of fluids.  Choose water first. TAKE A FIBER SUPPLEMENT EVERY DAY THE REST OF YOUR LIFE During your first week back home, gradually add back a fiber supplement every day Experiment which form you can tolerate.   There are many forms such as powders, tablets, wafers, gummies, etc Psyllium bran (Metamucil), methylcellulose (Citrucel), Miralax or Glycolax, Benefiber, Flax Seed.  Adjust the dose week-by-week (1/2 dose/day to 6 doses a day) until you are moving your bowels 1-2 times a day.  Cut back the dose or try a different fiber product if it is giving you problems such as diarrhea or bloating. Sometimes a laxative is needed to help jump-start bowels if constipated until the fiber supplement can help regulate your bowels.  If you are tolerating eating & you are farting, it is okay to try a gentle  laxative such as double dose MiraLax, prune juice, or Milk of Magnesia.  Avoid using laxatives too often. Stool softeners can sometimes help counteract the constipating effects of narcotic pain medicines.  It can also cause diarrhea, so avoid using for too long. If you are still constipated despite taking fiber daily, eating solids, and a few doses of laxatives, call our office. Controlling diarrhea Try drinking liquids and eating bland foods for a few days to avoid stressing your intestines further. Avoid dairy products (especially milk & ice cream) for a short time.  The intestines often can lose the ability to digest lactose when stressed. Avoid foods that cause gassiness or bloating.  Typical foods include beans and other legumes, cabbage, broccoli, and dairy foods.  Avoid greasy, spicy, fast foods.  Every person has some sensitivity to other foods, so listen to your body and avoid those foods that trigger problems for you. Probiotics (such as active yogurt, Align, etc) may help repopulate the intestines and colon with normal bacteria and calm down a sensitive digestive tract Adding a fiber supplement gradually can help thicken stools by absorbing excess fluid and retrain the intestines to act more normally.  Slowly increase the dose over a few weeks.  Too much fiber too soon can backfire and cause cramping & bloating. It is okay to try and slow down diarrhea with a few doses of antidiarrheal medicines.   Bismuth subsalicylate (ex. Kayopectate, Pepto Bismol) for a few doses can help control diarrhea.  Avoid if pregnant.   Loperamide (Imodium) can slow down diarrhea.  Start with one tablet (2mg) first.  Avoid if you are having fevers or severe pain.  ILEOSTOMY PATIENTS WILL HAVE CHRONIC DIARRHEA since their colon is not in use.    Drink plenty of liquids.  You will need to drink even more glasses of water/liquid a day to avoid getting dehydrated. Record output from your ileostomy.  Expect to empty  the bag every 3-4 hours at first.  Most people with a permanent ileostomy empty their bag 4-6 times at the least.   Use antidiarrheal medicine (especially Imodium) several times a day to avoid getting dehydrated.  Start with a dose at bedtime & breakfast.  Adjust up or   down as needed.  Increase antidiarrheal medications as directed to avoid emptying the bag more than 8 times a day (every 3 hours). Work with your wound ostomy nurse to learn care for your ostomy.  See ostomy care instructions. TROUBLESHOOTING IRREGULAR BOWELS 1) Start with a soft & bland diet. No spicy, greasy, or fried foods.  2) Avoid gluten/wheat or dairy products from diet to see if symptoms improve. 3) Miralax 17gm or flax seed mixed in 8oz. water or juice-daily. May use 2-4 times a day as needed. 4) Gas-X, Phazyme, etc. as needed for gas & bloating.  5) Prilosec (omeprazole) over-the-counter as needed 6)  Consider probiotics (Align, Activa, etc) to help calm the bowels down  Call your doctor if you are getting worse or not getting better.  Sometimes further testing (cultures, endoscopy, X-ray studies, CT scans, bloodwork, etc.) may be needed to help diagnose and treat the cause of the diarrhea. [facility] [address], [address], [place], [place]  [address] [phone] - Main.    [phone]  - Toll Free.   [phone] - Fax [email or web address]   ###############################   #######################################################  Ostomy Support Information  You've heard that people get along just fine with only one of their eyes, or one of their lungs, or one of their kidneys. But you also know that you have only one intestine and only one bladder, and that leaves you feeling awfully empty, both physically and emotionally: You think no other people go around without part of their intestine with the ends of their intestines sticking out through their abdominal walls.    YOU ARE NOT ALONE.  There are nearly three quarters of a million people in the [place] who have an ostomy; people who have had surgery to remove all or part of their colons or bladders.   There is even a national association, the [employer] with over 350 local affiliated support groups that are organized by volunteers who provide peer support and counseling. [employer] has a toll free telephone num-ber, 800-826-0826 and an educational, interactive website, www.ostomy.org   An ostomy is an opening in the belly (abdominal wall) made by surgery. Ostomates are people who have had this procedure. The opening (stoma) allows the kidney or bowel to grdischarge waste. An external pouch covers the stoma to collect waste. Pouches are are a simple bag and are odor free. Different companies have disposable or reusable pouches to fit one's lifestyle. An ostomy can either be temporary or permanent.   THERE ARE THREE MAIN TYPES OF OSTOMIES Colostomy. A colostomy is a surgically created opening in the large intestine (colon). Ileostomy. An ileostomy is a surgically created opening in the small intestine. Urostomy. A urostomy is a surgically created opening to divert urine away from the bladder.  OSTOMY Care  The following guidelines will make care of your colostomy easier. Keep this information close by for quick reference.  Helpful DIET hints Eat a well-balanced diet including vegetables and fresh fruits. Eat on a regular schedule.  Drink at least 6 to 8 glasses of fluids daily. Eat slowly in a relaxed atmosphere. Chew your food thoroughly. Avoid chewing gum, smoking, and drinking from a straw. This will help decrease the amount of air you swallow, which may help reduce gas. Eating yogurt or drinking buttermilk may help reduce gas.  To control gas at night, do not eat after 8 p.m. This will give your bowel time to quiet down before you go   to bed.  If gas is a problem, you can purchase  Beano. Sprinkle Beano on the first bite of food before eating to reduce gas. It has no flavor and should not change the taste of your food. You can buy Beano over the counter at your local drugstore.  Foods like fish, onions, garlic, broccoli, asparagus, and cabbage produce odor. Although your pouch is odor-proof, if you eat these foods you may notice a stronger odor when emptying your pouch. If this is a concern, you may want to limit these foods in your diet.  If you have an ileostomy, you will have chronic diarrhea & need to drink more liquids to avoid getting dehydrated.  Consider antidiarrheal medicine like imodium (loperamide) or Lomotil to help slow down bowel movements / diarrhea into your ileostomy bag.  GETTING TO GOOD BOWEL HEALTH WITH AN ILEOSTOMY    With the colon bypassed & not in use, you will have small bowel diarrhea.   It is important to thicken & slow your bowel movements down.   The goal: 4-6 small BOWEL MOVEMENTS A DAY It is important to drink plenty of liquids to avoid getting dehydrated  CONTROLLING ILEOSTOMY DIARRHEA  TAKE A FIBER SUPPLEMENT (FiberCon or Benefiner soluble fiber) twice a day - to thicken stools by absorbing excess fluid and retrain the intestines to act more normally.  Slowly increase the dose over a few weeks.  Too much fiber too soon can backfire and cause cramping & bloating.  TAKE AN IRON SUPPLEMENT twice a day to naturally constipate your bowels.  Usually ferrous sulfate 325mg twice a day)  TAKE ANTI-DIARRHEAL MEDICINES: Loperamide (Imodium) can slow down diarrhea.  Start with two tablets (= 4mg) first and then try one tablet every 6 hours.  Can go up to 2 pills four times day (8 pills of 2mg max) Avoid if you are having fevers or severe pain.  If you are not better or start feeling worse, stop all medicines and call your doctor for advice LoMotil (Diphenoxylate / Atropine) is another medicine that can constipate & slow down bowel moevements Pepto  Bismol (bismuth) can gently thicken bowels as well  If diarrhea is worse,: drink plenty of liquids and try simpler foods for a few days to avoid stressing your intestines further. Avoid dairy products (especially milk & ice cream) for a short time.  The intestines often can lose the ability to digest lactose when stressed. Avoid foods that cause gassiness or bloating.  Typical foods include beans and other legumes, cabbage, broccoli, and dairy foods.  Every person has some sensitivity to other foods, so listen to our body and avoid those foods that trigger problems for you.Call your doctor if you are getting worse or not better.  Sometimes further testing (cultures, endoscopy, X-ray studies, bloodwork, etc) may be needed to help diagnose and treat the cause of the diarrhea. Take extra anti-diarrheal medicines (maximum is 8 pills of 2mg loperamide a day)   Tips for POUCHING an OSTOMY   Changing Your Pouch The best time to change your pouch is in the morning, before eating or drinking anything. Your stoma can function at any time, but it will function more after eating or drinking.   Applying the pouching system  Place all your equipment close at hand before removing your pouch.  Wash your hands.  Stand or sit in front of a mirror. Use the position that works best for you. Remember that you must keep the skin around the stoma   wrinkle-free for a good seal.  Gently remove the used pouch (1-piece system) or the pouch and old wafer (2-piece system). Empty the pouch into the toilet. Save the closure clip to use again.  Wash the stoma itself and the skin around the stoma. Your stoma may bleed a little when being washed. This is normal. Rinse and pat dry. You may use a wash cloth or soft paper towels (like Bounty), mild soap (like Dial, Safeguard, or Ivory), and water. Avoid soaps that contain perfumes or lotions.  For a new pouch (1-piece system) or a new wafer (2-piece system), measure your  stoma using the stoma guide in each box of supplies.  Trace the shape of your stoma onto the back of the new pouch or the back of the new wafer. Cut out the opening. Remove the paper backing and set it aside.  Optional: Apply a skin barrier powder to surrounding skin if it is irritated (bare or weeping), and dust off the excess. Optional: Apply a skin-prep wipe (such as Skin Prep or All-Kare) to the skin around the stoma, and let it dry. Do not apply this solution if the skin is irritated (red, tender, or broken) or if you have shaved around the stoma. Optional: Apply a skin barrier paste (such as Stomahesive, Coloplast, or Premium) around the opening cut in the back of the pouch or wafer. Allow it to dry for 30 to 60 seconds.  Hold the pouch (1-piece system) or wafer (2-piece system) with the sticky side toward your body. Make sure the skin around the stoma is wrinkle-free. Center the opening on the stoma, then press firmly to your abdomen (Fig. 4). Look in the mirror to check if you are placing the pouch, or wafer, in the right position. For a 2-piece system, snap the pouch onto the wafer. Make sure it snaps into place securely.  Place your hand over the stoma and the pouch or wafer for about 30 seconds. The heat from your hand can help the pouch or wafer stick to your skin.  Add deodorant (such as Super Banish or Nullo) to your pouch. Other options include food extracts such as vanilla oil and peppermint extract. Add about 10 drops of the deodorant to the pouch. Then apply the closure clamp. Note: Do not use toxic  chemicals or commercial cleaning agents in your pouch. These substances may harm the stoma.  Optional: For extra seal, apply tape to all 4 sides around the pouch or wafer, as if you were framing a picture. You may use any brand of medical adhesive tape. Change your pouch every 5 to 7 days. Change it immediately if a leak occurs.  Wash your hands afterwards.  If you are wearing a  2-piece system, you may use 2 new pouches per week and alternate them. Rinse the pouch with mild soap and warm water and hang it to dry for the next day. Apply the fresh pouch. Alternate the 2 pouches like this for a week. After a week, change the wafer and begin with 2 new pouches. Place the old pouches in a plastic bag, and put them in the trash.   LIVING WITH AN OSTOMY  Emptying Your Pouch Empty your pouch when it is one-third full (of urine, stool, and/or gas). If you wait until your pouch is fuller than this, it will be more difficult to empty and more noticeable. When you empty your pouch, either put toilet paper in the toilet bowl first, or flush the   toilet while you empty the pouch. This will reduce splashing. You can empty the pouch between your legs or to one side while sitting, or while standing or stooping. If you have a 2-piece system, you can snap off the pouch to empty it. Remember that your stoma may function during this time. If you wish to rinse your pouch after you empty it, a turkey baster can be helpful. When using a baster, squirt water up into the pouch through the opening at the bottom. With a 2-piece system, you can snap off the pouch to rinse it. After rinsing  your pouch, empty it into the toilet. When rinsing your pouch at home, put a few granules of Dreft soap in the rinse water. This helps lubricate and freshen your pouch. The inside of your pouch can be sprayed with non-stick cooking oil (Pam spray). This may help reduce stool sticking to the inside of the pouch.  Bathing You may shower or bathe with your pouch on or off. Remember that your stoma may function during this time.  The materials you use to wash your stoma and the skin around it should be clean, but they do not need to be sterile.  Wearing Your Pouch During hot weather, or if you perspire a lot in general, wear a cover over your pouch. This may prevent a rash on your skin under the pouch. Pouch covers are  sold at ostomy supply stores. Wear the pouch inside your underwear for better support. Watch your weight. Any gain or loss of 10 to 15 pounds or more can change the way your pouch fits.  Going Away From Home A collapsible cup (like those that come in travel kits) or a soft plastic squirt bottle with a pull-up top (like a travel bottle for shampoo) can be used for rinsing your pouch when you are away from home. Tilt the opening of the pouch at an upward angle when using a cup to rinse.  Carry wet wipes or extra tissues to use in public bathrooms.  Carry an extra pouching system with you at all times.  Never keep ostomy supplies in the glove compartment of your car. Extreme heat or cold can damage the skin barriers and adhesive wafers on the pouch.  When you travel, carry your ostomy supplies with you at all times. Keep them within easy reach. Do not pack ostomy supplies in baggage that will be checked or otherwise separated from you, because your baggage might be lost. If you're traveling out of the country, it is helpful to have a letter stating that you are carrying ostomy supplies as a medical necessity.  If you need ostomy supplies while traveling, look in the yellow pages of the telephone book under "Surgical Supplies." Or call the local ostomy organization to find out where supplies are available.  Do not let your ostomy supplies get low. Always order new pouches before you use the last one.  Reducing Odor Limit foods such as broccoli, cabbage, onions, fish, and garlic in your diet to help reduce odor. Each time you empty your pouch, carefully clean the opening of the pouch, both inside and outside, with toilet paper. Rinse your pouch 1 or 2 times daily after you empty it (see directions for emptying your pouch and going away from home). Add deodorant (such as Super Banish or Nullo) to your pouch. Use air deodorizers in your bathroom. Do not add aspirin to your pouch. Even though  aspirin can help prevent odor, it   could cause ulcers on your stoma.  When to call the doctor Call the doctor if you have any of the following symptoms: Purple, black, or white stoma Severe cramps lasting more than 6 hours Severe watery discharge from the stoma lasting more than 6 hours No output from the colostomy for 3 days Excessive bleeding from your stoma Swelling of your stoma to more than 1/2-inch larger than usual Pulling inward of your stoma below skin level Severe skin irritation or deep ulcers Bulging or other changes in your abdomen  When to call your ostomy nurse Call your ostomy/enterostomal therapy (WOCN) nurse if any of the following occurs: Frequent leaking of your pouching system Change in size or appearance of your stoma, causing discomfort or problems with your pouch Skin rash or rawness Weight gain or loss that causes problems with your pouch     FREQUENTLY ASKED QUESTIONS   Why haven't you met any of these folks who have an ostomy?  Well, maybe you have! You just did not recognize them because an ostomy doesn't show. It can be kept secret if you wish. Why, maybe some of your best friends, office associates or neighbors have an ostomy ... you never can tell. People facing ostomy surgery have many quality-of-life questions like: Will you bulge? Smell? Make noises? Will you feel waste leaving your body? Will you be a captive of the toilet? Will you starve? Be a social outcast? Get/stay married? Have babies? Easily bathe, go swimming, bend over?  OK, let's look at what you can expect:   Will you bulge?  Remember, without part of the intestine or bladder, and its contents, you should have a flatter tummy than before. You can expect to wear, with little exception, what you wore before surgery ... and this in-cludes tight clothing and bathing suits.   Will you smell?  Today, thanks to modern odor proof pouching systems, you can walk into an ostomy support group  meeting and not smell anything that is foul or offensive. And, for those with an ileostomy or colostomy who are concerned about odor when emptying their pouch, there are in-pouch deodorants that can be used to eliminate any waste odors that may exist.   Will you make noises?  Everyone produces gas, especially if they are an air-swallower. But intestinal sounds that occur from time to time are no differ-ent than a gurgling tummy, and quite often your clothing will muffle any sounds.   Will you feel the waste discharges?  For those with a colostomy or ileostomy there might be a slight pressure when waste leaves your body, but understand that the intestines have no nerve endings, so there will be no unpleasant sensations. Those with a urostomy will probably be unaware of any kidney drainage.   Will you be a captive of the toilet?  Immediately post-op you will spend more time in the bathroom than you will after your body recovers from surgery. Every person is different, but on average those with an ileostomy or urostomy may empty their pouches 4 to 6 times a day; a little  less if you have a colostomy. The average wear time between pouch system changes is 3 to 5 days and the changing process should take less than 30 minutes.   Will I need to be on a special diet? Most people return to their normal diet when they have recovered from surgery. Be sure to chew your food well, eat a well-balanced diet and drink plenty of fluids. If   you experience problems with a certain food, wait a couple of weeks and try it again.  Will there be odor and noises? Pouching systems are designed to be odor-proof or odor-resistant. There are deodorants that can be used in the pouch. Medications are also available to help reduce odor. Limit gas-producing foods and carbonated beverages. You will experience less gas and fewer noises as you heal from surgery.  How much time will it take to care for my ostomy? At first, you may  spend a lot of time learning about your ostomy and how to take care of it. As you become more comfortable and skilled at changing the pouching system, it will take very little time to care for it.   Will I be able to return to work? People with ostomies can perform most jobs. As soon as you have healed from surgery, you should be able to return to work. Heavy lifting (more than 10 pounds) may be discouraged.   What about intimacy? Sexual relationships and intimacy are important and fulfilling aspects of your life. They should continue after ostomy surgery. Intimacy-related concerns should be discussed openly between you and your partner.   Can I wear regular clothing? You do not need to wear special clothing. Ostomy pouches are fairly flat and barely noticeable. Elastic undergarments will not hurt the stoma or prevent the ostomy from functioning.   Can I participate in sports? An ostomy should not limit your involvement in sports. Many people with ostomies are runners, skiers, swimmers or participate in other active lifestyles. Talk with your caregiver first before doing heavy physical activity.  Will you starve?  Not if you follow doctor's orders at each stage of your post-op adjustment. There is no such thing as an "ostomy diet". Some people with an ostomy will be able to eat and tolerate anything; others may find diffi-culty with some foods. Each person is an individual and must determine, by trial, what is best for them. A good practice for all is to drink plenty of water.   Will you be a social outcast?  Have you met anyone who has an ostomy and is a social outcast? Why should you be the first? Only your attitude and self image will effect how you are treated. No confi-dent person is an outcast.    PROFESSIONAL HELP   Resources are available if you need help or have questions about your ostomy.   Specially trained nurses called Wound, Ostomy Continence Nurses (WOCN) are available for  consultation in most major medical centers.  Consider getting an ostomy consult at an outpatient ostomy clinic.   [place] has an Ostomy Clinic run by an WOCN ostomy nurse at the [facility].  [phone]. [facility] can help set up an appointment   The United Ostomy Association (UOA) is a group made up of many local chapters throughout the [place]. These local groups hold meetings and provide support to prospective and existing ostomates. They sponsor educational events and have qualified visitors to make personal or telephone visits. Contact the UOA for the chapter nearest you and for other educational publications.  More detailed information can be found in Colostomy Guide, a publication of the United Ostomy Association (UOA). Contact UOA at 1-800-826-0826 or visit their web site at www.uoaa.org. The website contains links to other sites, suppliers and resources.  [employer] Secure Start Services: Start at the website to enlist for support.  Your Wound Ostomy (WOCN) nurse may have started this   process. https://www.hollister.com/en/securestart Secure Start services are designed to support people as they live their lives with an ostomy or neurogenic bladder. Enrolling is easy and at no cost to the patient. We realize that each person's needs and life journey are different. Through Secure Start services, we want to help people live their life, their way.  #######################################################  

## 2020-11-10 NOTE — TOC Transition Note (Signed)
Transition of Care [facility]) - CM/SW Discharge Note   Patient Details  Name: Cheyenne Scott MRN: [id number] Date of Birth: 06/22/1955  Transition of Care [facility]) CM/SW Contact:  Lennart Pall, LCSW Phone Number: 11/10/2020, 10:52 AM   Clinical Narrative:    Pt anticipates dc today and agreeable with TOC arranging [place] per MD orders. No agency preference.  Referral placed with [place]. No further TOC needs.   Final next level of care: Home w Home Health Services Barriers to Discharge: Barriers Resolved   Patient Goals and CMS Choice Patient states their goals for this hospitalization and ongoing recovery are:: return home      Discharge Placement                       Discharge Plan and Services                DME Arranged: Ostomy supplies DME Agency: Other - Comment ([place] RN referred pt to [employer] program)       HH Arranged: RN [place] Agency: [place] ([facility]) Date HH Agency Contacted: 11/10/20 Time [place]: 9432 Representative spoke with at [place]: [place] ([place]) Interventions     Readmission Risk Interventions No flowsheet data found.

## 2020-11-10 NOTE — Plan of Care (Signed)
  Problem: Education: Goal: Knowledge of ostomy care will improve Outcome: Adequate for Discharge Goal: Understanding of discharge needs will improve Outcome: Adequate for Discharge   Problem: Bowel/Gastric/Urinary: Goal: Gastrointestinal status for postoperative course will improve Outcome: Adequate for Discharge   Problem: Coping: Goal: Coping ability will improve Outcome: Adequate for Discharge   Problem: Fluid Volume: Goal: Ability to achieve a balanced intake and output will improve Outcome: Adequate for Discharge   Problem: Health Behavior/Discharge Planning: Goal: Ability to manage health-related needs will improve Outcome: Adequate for Discharge   Problem: Nutrition: Goal: Will attain and maintain optimal nutritional status will improve Outcome: Adequate for Discharge   Problem: Clinical Measurements: Goal: Postoperative complications will be avoided or minimized Outcome: Adequate for Discharge   Problem: Skin Integrity: Goal: Will show signs of wound healing Outcome: Adequate for Discharge Goal: Risk for impaired skin integrity will decrease Outcome: Adequate for Discharge   

## 2020-11-10 NOTE — Progress Notes (Signed)
Patient demonstrated how to properly change dressings> Patient has no questions. RN took patient to main entrance via wheelchair.

## 2020-11-10 NOTE — Discharge Summary (Signed)
Physician Discharge Summary  Patient ID: EUSTOLIA DRENNEN MRN: [id number] DOB/AGE: 66/06/1955 66 y.o.  Admit date: 11/05/2020 Discharge date: 11/10/2020  Admission Diagnoses: rectal cancer  Discharge Diagnoses:  Active Problems:   Rectal cancer [facility])   Discharged Condition: good  Hospital Course: Patient was admitted to the med surg floor after surgery.  Diet was advanced as tolerated.  Patient began to have bowel function on postop day 1.  By postop day 5, she was tolerating a solid diet and pain was controlled with oral medications.  She was urinating without difficulty and ambulating without assistance.  She completed ileostomy teaching and was having less than 1L output per day and good UOP.  Patient was felt to be in stable condition for discharge to home.   Consults: None  Significant Diagnostic Studies: labs: cbc, bmet  Treatments: IV hydration, analgesia: acetaminophen, and surgery: robotic LAR and diverting ileostomy  Discharge Exam: Blood pressure 118/69, pulse 70, temperature 98.3 F (36.8 C), temperature source Oral, resp. rate 16, height 5\' 3"  (1.6 m), weight 68.6 kg, SpO2 98 %. General appearance: alert and cooperative GI: soft, appropriately tender Incision/Wound: clean, dry, intact  Disposition: Discharge disposition: 01-Home or Self Care        Allergies as of 11/10/2020       Reactions   Penicillins Rash   Childhood reaction        Medication List     STOP taking these medications    diphenoxylate-atropine 2.5-0.025 MG tablet Commonly known as: LOMOTIL   lidocaine-prilocaine cream Commonly known as: EMLA   ondansetron 8 MG tablet Commonly known as: ZOFRAN   potassium chloride SA 20 MEQ tablet Commonly known as: KLOR-CON   prochlorperazine 10 MG tablet Commonly known as: COMPAZINE       TAKE these medications    acetaminophen 325 MG tablet Commonly known as: TYLENOL Take 650 mg by mouth every 6 (six) hours as needed for moderate  pain.   ascorbic acid 250 MG tablet Commonly known as: VITAMIN C Take 250 mg by mouth daily.   cetirizine 10 MG tablet Commonly known as: ZYRTEC Take 10 mg by mouth daily as needed for allergies.   estradiol 0.1 MG/GM vaginal cream Commonly known as: ESTRACE Place 0.5 Applicatorfuls vaginally 2 (two) times a week.   L-Lysine 500 MG Tabs Take 500 mg by mouth daily.   multivitamin with minerals Tabs tablet Take 1 tablet by mouth daily.   omeprazole 20 MG capsule Commonly known as: PRILOSEC Take 20 mg by mouth daily.   traMADol 50 MG tablet Commonly known as: ULTRAM Take 1-2 tablets (50-100 mg total) by mouth every 6 (six) hours as needed for moderate pain.   Vitamin D3 50 MCG (2000 UT) capsule Take 2,000 Units by mouth daily.   zinc gluconate 50 MG tablet Take 50 mg by mouth daily.        Follow-up Information     Leighton Ruff, MD. Go on 40/98/1191.   Specialties: General Surgery, Colon and Rectal Surgery Contact information: [address] [address] [phone]                 Signed: Rosario Adie 62/10/5282, 9:09 AM

## 2020-11-10 NOTE — Consult Note (Signed)
[place] Nurse ostomy follow up Stoma type/location: upper mid line  Stomal assessment/size: 1 1/4" loop stoma Red rubber support rod removed per MD orders Peristomal assessment: denudation at 3 and 9 o'clock  Treatment options for stomal/peristomal skin: patient and CG aware of crusting; used ostomy powder and skin prep to crust irritated skin today Output liquid green Ostomy pouching: 2pc. 2 3/4 with 2" skin barrier ring  Education provided:  Allowed patient to empty, she cleaned spout with wick. She performed all steps of pouch change with minimal assistance. She can not see the distal aspect of the stoma which will be improved when she is able to stand in her bathroom.  She is aware of risk of dehydration we discussed this again today.  Provided contact information for [employer] ostomy supplies; [place] contact email. Her daughter in law is an Therapist, sports here at [employer].  5 pouching systems sent home with patient; new pattern cut after pouch in place to re-size for her, sent home with patient.  Enrolled patient in [place] Start Discharge program: Yes, has arrived at patient home already.   Planning for DC to home today with [facility]  Follow up in the outpatient ostomy clinic  [place], [place], [place]

## 2020-11-12 ENCOUNTER — Other Ambulatory Visit: Payer: Self-pay

## 2020-11-12 NOTE — Progress Notes (Signed)
The proposed treatment discussed in conference is for discussion purpose only and is not a binding recommendation.  The patients have not been physically examined, or presented with their treatment options.  Therefore, final treatment plans cannot be decided.  

## 2020-11-14 ENCOUNTER — Ambulatory Visit (HOSPITAL_COMMUNITY)
Admission: RE | Admit: 2020-11-14 | Discharge: 2020-11-14 | Disposition: A | Discharge status: Home / Self Care | Payer: Medicare Other | Source: Ambulatory Visit | Attending: Nurse Practitioner | Admitting: Nurse Practitioner

## 2020-11-14 ENCOUNTER — Other Ambulatory Visit: Payer: Self-pay

## 2020-11-14 DIAGNOSIS — L24B1 Irritant contact dermatitis related to digestive stoma or fistula: Secondary | ICD-10-CM | POA: Diagnosis not present

## 2020-11-14 DIAGNOSIS — Z432 Encounter for attention to ileostomy: Secondary | ICD-10-CM | POA: Diagnosis present

## 2020-11-14 NOTE — Discharge Instructions (Signed)
Pouching instructions: Remove old pouch and wash skin with mild soap and water (Dove, ivory) Pat dry. Apply stoma powder- light dusting to red skin Tap with water or skin prep (patient choice)  Repeat 2 more times when skin is raw Apply barrier ring Apply 1 piece convex pouch (ITEM # [id number]) Apply barrier strips ITEM # [id number] Needs medium ostomy belt (Item # [id number])

## 2020-11-14 NOTE — Progress Notes (Signed)
[facility]   Reason for visit:  RUQ loop ileostomy, retention rod removed. Pouch leaking, peristomal skin breakdown.  Unclear on supplies HPI:  Rectal CA with resection and loop ileostomy ROS  Review of Systems  Gastrointestinal:        RUQ ileostomy  All other systems reviewed and are negative. Vital signs:  BP (!) 161/66 (BP Location: Left Arm)   Pulse (!) 105   Temp 98.1 F (36.7 C) (Oral)   Resp 18   Ht 5\' 3"  (1.6 m)   Wt 68.5 kg   SpO2 100%   BMI 26.75 kg/m  Exam:  Physical Exam Abdominal:    Skin:    Findings: Rash present.          Comments: Peristomal breakdown around ileostomy    Stoma type/location:  RUQ ileostomy Stomal assessment/size:  Os points down at 6 o'clock.  Mucus fistula points straight ahead Peristomal assessment:  Denuded skin from 4 to 7 o'clock.  Patient reports stinging in this area.  States the [facility] nurse used skin prep and skin was irritated after that.  She prefers not to use skin prep at this time.  I explained that if pouch is applied quickly before skin prep evaporates, it can cause irritation.  Treatment options for stomal/peristomal skin: for denuded skin, we are adding ceraplus barrier ring, stoma powder and crusting with tap water.   Output: liquid brown stool Ostomy pouching: 1pc.convex pouch with belt.  Education provided:  Sister in Sports coach with patient today.  They both observe pouch change.  HH is still in with patient.  I am providing detailed step by step instructions.  They are to call me if they like the 1 piece pouch and I will call in Rx to [facility].  We discussed signs of dehydration, symptoms of blockage and dietary considerations with ileostomy.     Impression/dx  Contact dermatitis Ileostomy Discussion  New pouching procedure.  Written instructions provided.  I flagged all the products used today in an [employer] catalog to show [facility] nurse.  Plan  Follow up appointment made.  Call office to let me know how new  pouch is performing.     Visit time: 55 minutes.   Domenic Moras FNP-BC

## 2020-11-24 ENCOUNTER — Ambulatory Visit: Payer: Medicare Other | Admitting: Hematology and Oncology

## 2020-11-24 ENCOUNTER — Other Ambulatory Visit: Payer: Medicare Other

## 2020-11-25 ENCOUNTER — Ambulatory Visit (HOSPITAL_COMMUNITY)
Admission: RE | Admit: 2020-11-25 | Discharge: 2020-11-25 | Disposition: A | Discharge status: Home / Self Care | Payer: Medicare Other | Source: Ambulatory Visit | Attending: Hematology and Oncology | Admitting: Hematology and Oncology

## 2020-11-25 ENCOUNTER — Other Ambulatory Visit: Payer: Self-pay

## 2020-11-25 DIAGNOSIS — L24B1 Irritant contact dermatitis related to digestive stoma or fistula: Secondary | ICD-10-CM

## 2020-11-25 DIAGNOSIS — Z932 Ileostomy status: Secondary | ICD-10-CM | POA: Insufficient documentation

## 2020-11-25 DIAGNOSIS — L259 Unspecified contact dermatitis, unspecified cause: Secondary | ICD-10-CM | POA: Diagnosis not present

## 2020-11-25 DIAGNOSIS — C20 Malignant neoplasm of rectum: Secondary | ICD-10-CM | POA: Insufficient documentation

## 2020-11-25 DIAGNOSIS — Z432 Encounter for attention to ileostomy: Secondary | ICD-10-CM | POA: Diagnosis not present

## 2020-11-25 NOTE — Progress Notes (Signed)
[facility]   Reason for visit:  RUQ loop ileostomy.  Accompanied by her daughter today who is a Marine scientist.  HPI:  Rectal CA with loop ileostomy ROS  Review of Systems Vital signs:  BP 135/77 (BP Location: Right Arm)   Pulse 88   Temp 98.4 F (36.9 C) (Oral)   Resp 18   Ht 5\' 3"  (1.6 m)   Wt 68.5 kg   SpO2 100%   BMI 26.75 kg/m  Exam:  Physical Exam Abdominal:     Palpations: Abdomen is soft.     Comments: RUQ ileostomy  Skin:    Findings: Rash present.     Comments: COntact dermatitis, improved  Neurological:     Mental Status: She is alert.  Psychiatric:        Mood and Affect: Mood normal.    Stoma type/location:  RUQ ileostomy Stomal assessment/size:  1".  Denuded skin is improving with cera plus barrier ring and crusting with powder and skin prep.  Peristomal assessment:  improving Treatment options for stomal/peristomal skin: Barrier ring, 1 piece convex pouch with belt.  Output: liquid brown stool Ostomy pouching: 1pc. Convex pouch Education provided:  Pouch change performed.  Reinforced process with daughter and patient.     Impression/dx  Contact dermatitis improving Discussion  Continue same pouch Plan  Confirm supplies with [employer].     Visit time: 50 minutes.   Domenic Moras FNP-BC

## 2020-11-25 NOTE — Discharge Instructions (Signed)
ITEM # [id number]  convex skin barrier with cera plus ITEM # [id number] ultra clear pouch with viewing window Barrier ring ITEM # [id number]

## 2020-11-27 ENCOUNTER — Telehealth: Payer: Self-pay | Admitting: *Deleted

## 2020-11-27 NOTE — Telephone Encounter (Signed)
Faxed orders signed by Dr. [name] for [place] and Plan of Care to [facility] Fax confirmation received Orders sent to HIM to be scanned into patient chart

## 2020-12-01 ENCOUNTER — Inpatient Hospital Stay (HOSPITAL_BASED_OUTPATIENT_CLINIC_OR_DEPARTMENT_OTHER): Payer: Medicare Other | Admitting: Hematology and Oncology

## 2020-12-01 ENCOUNTER — Other Ambulatory Visit: Payer: Self-pay

## 2020-12-01 ENCOUNTER — Inpatient Hospital Stay: Payer: Medicare Other | Attending: Hematology and Oncology

## 2020-12-01 VITALS — BP 127/94 | HR 90 | Temp 98.3°F | Resp 18 | Wt 152.9 lb

## 2020-12-01 DIAGNOSIS — D701 Agranulocytosis secondary to cancer chemotherapy: Secondary | ICD-10-CM

## 2020-12-01 DIAGNOSIS — Z923 Personal history of irradiation: Secondary | ICD-10-CM | POA: Diagnosis not present

## 2020-12-01 DIAGNOSIS — L271 Localized skin eruption due to drugs and medicaments taken internally: Secondary | ICD-10-CM

## 2020-12-01 DIAGNOSIS — Z9221 Personal history of antineoplastic chemotherapy: Secondary | ICD-10-CM | POA: Insufficient documentation

## 2020-12-01 DIAGNOSIS — G629 Polyneuropathy, unspecified: Secondary | ICD-10-CM | POA: Insufficient documentation

## 2020-12-01 DIAGNOSIS — Z95828 Presence of other vascular implants and grafts: Secondary | ICD-10-CM

## 2020-12-01 DIAGNOSIS — C20 Malignant neoplasm of rectum: Secondary | ICD-10-CM

## 2020-12-01 DIAGNOSIS — R252 Cramp and spasm: Secondary | ICD-10-CM | POA: Insufficient documentation

## 2020-12-01 DIAGNOSIS — T451X5A Adverse effect of antineoplastic and immunosuppressive drugs, initial encounter: Secondary | ICD-10-CM

## 2020-12-01 LAB — CMP (CANCER CENTER ONLY)
ALT: 24 U/L (ref 0–44)
AST: 31 U/L (ref 15–41)
Albumin: 3.7 g/dL (ref 3.5–5.0)
Alkaline Phosphatase: 128 U/L — ABNORMAL HIGH (ref 38–126)
Anion gap: 7 (ref 5–15)
BUN: 18 mg/dL (ref 8–23)
CO2: 27 mmol/L (ref 22–32)
Calcium: 9.2 mg/dL (ref 8.9–10.3)
Chloride: 106 mmol/L (ref 98–111)
Creatinine: 0.69 mg/dL (ref 0.44–1.00)
GFR, Estimated: >60 mL/min (ref >60)
Glucose, Bld: 124 mg/dL — ABNORMAL HIGH (ref 70–99)
Potassium: 3.9 mmol/L (ref 3.5–5.1)
Sodium: 140 mmol/L (ref 135–145)
Total Bilirubin: 0.4 mg/dL (ref 0.3–1.2)
Total Protein: 7.4 g/dL (ref 6.5–8.1)

## 2020-12-01 LAB — CBC WITH DIFFERENTIAL (CANCER CENTER ONLY)
Abs Immature Granulocytes: 0.01 10*3/uL (ref 0.00–0.07)
Basophils Absolute: 0.1 10*3/uL (ref 0.0–0.1)
Basophils Relative: 1 %
Eosinophils Absolute: 0.2 10*3/uL (ref 0.0–0.5)
Eosinophils Relative: 4 %
HCT: 34.4 % — ABNORMAL LOW (ref 36.0–46.0)
Hemoglobin: 11.3 g/dL — ABNORMAL LOW (ref 12.0–15.0)
Immature Granulocytes: 0 %
Lymphocytes Relative: 11 %
Lymphs Abs: 0.5 10*3/uL — ABNORMAL LOW (ref 0.7–4.0)
MCH: 30.8 pg (ref 26.0–34.0)
MCHC: 32.8 g/dL (ref 30.0–36.0)
MCV: 93.7 fL (ref 80.0–100.0)
Monocytes Absolute: 0.5 10*3/uL (ref 0.1–1.0)
Monocytes Relative: 11 %
Neutro Abs: 3.4 10*3/uL (ref 1.7–7.7)
Neutrophils Relative %: 73 %
Platelet Count: 238 10*3/uL (ref 150–400)
RBC: 3.67 MIL/uL — ABNORMAL LOW (ref 3.87–5.11)
RDW: 11.7 % (ref 11.5–15.5)
WBC Count: 4.7 10*3/uL (ref 4.0–10.5)
nRBC: 0 % (ref 0.0–0.2)

## 2020-12-01 NOTE — Progress Notes (Signed)
[place] [phone]   [phone]  PROGRESS NOTE  Patient Care Team: Beverley Fiedler, FNP as PCP - General (Endocrinology) [name], RN (Inactive) as Oncology Nurse Navigator [name], MD as Consulting Physician (Hematology and Oncology)  Hematological/Oncological History # Rectal Adenocarcainoma, Stage T3N1M0 1) 01/18/2020: colonoscopy due to bright red blood per rectum. A mass was found in the rectum 8 cm from the anal verge. Biopsy confirms moderately differentiated adenocarcinoma.  2) 01/29/2020: CT chest showed no evidence of metastatic disease. MRI showed a T2-T3 malignancy that appears to involve the internal sphincter and 2 mesorectal nodes 3) 02/07/2020: seen at [facility] by Oncologist Dr. Georgiann Cocker 4) 02/11/2020: seen at [facility] by Dr. [name] 5) 02/21/2020: establish care with Dr. [name]  6) 03/05/2020: Start of Neoadjuvant FOLFOX (anticipate 12-16 weeks of chemotherapy) 7) 03/19/2020: Cycle 2 Day 1 of Neoadjuvant FOLFOX  8) 04/02/2020: Cycle 3 Day 1 of Neoadjuvant FOLFOX  9) 04/16/2020: Cycle 4 Day 1 of Neoadjuvant FOLFOX  10) 04/30/2020: Cycle 5 HELD due to HFS 11) 05/14/2020: Cycle 5 Day 1 of Neoadjuvant FOLFOX (25% dose reduction of the 5-FU due to HFS) 12) 05/28/2020: Cycle 6 Day 1 of Neoadjuvant FOLFOX (25% dose reduction of the 5-FU due to HFS) 13) 06/12/2020: Cycle 7 Day 1 of Neoadjuvant FOLFOX (25% dose reduction of the 5-FU due to HFS) 14) 06/25/2020: Cycle 8 Day 1 of Neoadjuvant FOLFOX (25% dose reduction of the 5-FU due to HFS) 15) 07/14/2020: start of chemoradiation with capecitabine 1500mg  PO BID (825mg /m2). Last day 08/21/2020 16) 11/25/2020: robotic assisted lower anterior resection performed by Dr. Marcello Moores.   Interval History:  Cheyenne Scott 65 y.o. female with medical history significant for rectal cancer who presents for a follow up visit. The patient's last visit was on 09/29/2020. In the  interim since the last visit Cheyenne Scott has undergone a robotic assisted lower anterior resection performed by Dr. Marcello Moores.   On exam today Cheyenne Scott reports surgery went quite well.  She is making good recovery and is not having any difficulty with postoperative pain.  She notes that she currently has an ostomy in place which she is hoping will be reversible.  She rates her pain currently at a 0/10 in severity.  She is having some mild irritation around the ostomy site but otherwise is producing soft stool.  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  She does endorse having some occasional muscle cramping which has improved with supplementation of potassium.  She reports her energy is "great".  She also notes that she is considering a move to [place] later this year to be closer to the rest of her family.  She denies any fevers, chills, sweats, vomiting.  Full 10 point ROS is listed below.   MEDICAL HISTORY:  Past Medical History:  Diagnosis Date   Acid reflux    Allergy    Rectal cancer ([place])     SURGICAL HISTORY: Past Surgical History:  Procedure Laterality Date   ANTERIOR AND POSTERIOR REPAIR N/A 01/20/2015   Procedure: ANTERIOR (CYSTOCELE) AND POSTERIOR REPAIR (RECTOCELE) WITH PERINEORRHAPHY AND UTEROSACRAL LIGAMENT  SUSPENSION ;  Surgeon: Brien Few, MD;  Location: [facility];  Service: Gynecology;  Laterality: N/A;   DIVERTING ILEOSTOMY N/A 11/05/2020   Procedure: DIVERTING ILEOSTOMY;  Surgeon: Leighton Ruff, MD;  Location: [facility];  Service: General;  Laterality: N/A;   IR IMAGING GUIDED PORT INSERTION  02/26/2020  ROBOTIC ASSISTED TOTAL HYSTERECTOMY WITH SALPINGECTOMY Bilateral 01/20/2015   Procedure: ROBOTIC ASSISTED TOTAL HYSTERECTOMY WITH BILATERAL SALPINGECTOMY;  Surgeon: Brien Few, MD;  Location: [facility];  Service: Gynecology;  Laterality: Bilateral;   TUBAL LIGATION     XI ROBOTIC ASSISTED LOWER ANTERIOR RESECTION N/A  11/05/2020   Procedure: XI ROBOTIC ASSISTED LOWER ANTERIOR RESECTION;  Surgeon: Leighton Ruff, MD;  Location: [facility];  Service: General;  Laterality: N/A;    SOCIAL HISTORY: Social History   Socioeconomic History   Marital status: Widowed    Spouse name: Not on file   Number of children: Not on file   Years of education: Not on file   Highest education level: Not on file  Occupational History   Not on file  Tobacco Use   Smoking status: Never   Smokeless tobacco: Never  Vaping Use   Vaping Use: Never used  Substance and Sexual Activity   Alcohol use: No    Alcohol/week: 0.0 standard drinks   Drug use: No   Sexual activity: Not on file  Other Topics Concern   Not on file  Social History Narrative   Not on file   Social Determinants of Health   Financial Resource Strain: Not on file  Food Insecurity: Not on file  Transportation Needs: Not on file  Physical Activity: Not on file  Stress: Not on file  Social Connections: Not on file  Intimate Partner Violence: Not on file    FAMILY HISTORY: Family History  Problem Relation Age of Onset   Diabetes Mother    Heart disease Mother    Cancer Brother    Stroke Maternal Grandmother     ALLERGIES:  is allergic to penicillins.  MEDICATIONS:  Current Outpatient Medications  Medication Sig Dispense Refill   acetaminophen (TYLENOL) 325 MG tablet Take 650 mg by mouth every 6 (six) hours as needed for moderate pain.     ascorbic acid (VITAMIN C) 250 MG tablet Take 250 mg by mouth daily.     cetirizine (ZYRTEC) 10 MG tablet Take 10 mg by mouth daily as needed for allergies.     Cholecalciferol (VITAMIN D3) 50 MCG (2000 UT) capsule Take 2,000 Units by mouth daily.     estradiol (ESTRACE) 0.1 MG/GM vaginal cream Place 0.5 Applicatorfuls vaginally 2 (two) times a week.     L-Lysine 500 MG TABS Take 500 mg by mouth daily.     Multiple Vitamin (MULTIVITAMIN WITH MINERALS) TABS tablet Take 1 tablet by mouth daily.     omeprazole  (PRILOSEC) 20 MG capsule Take 20 mg by mouth daily.     traMADol (ULTRAM) 50 MG tablet Take 1-2 tablets (50-100 mg total) by mouth every 6 (six) hours as needed for moderate pain. 30 tablet 0   zinc gluconate 50 MG tablet Take 50 mg by mouth daily.     No current facility-administered medications for this visit.    REVIEW OF SYSTEMS:   Constitutional: ( - ) fevers, ( - )  chills , ( - ) night sweats Eyes: ( - ) blurriness of vision, ( - ) double vision, ( - ) watery eyes Ears, nose, mouth, throat, and face: ( - ) mucositis, ( - ) sore throat Respiratory: ( - ) cough, ( - ) dyspnea, ( - ) wheezes Cardiovascular: ( - ) palpitation, ( - ) chest discomfort, ( - ) lower extremity swelling Gastrointestinal:  ( - ) nausea, ( - ) heartburn, ( - ) change in bowel habits  Skin: ( - ) abnormal skin rashes Lymphatics: ( - ) new lymphadenopathy, ( - ) easy bruising Neurological: ( - ) numbness, ( - ) tingling, ( - ) new weaknesses Behavioral/Psych: ( - ) mood change, ( - ) new changes  All other systems were reviewed with the patient and are negative.  PHYSICAL EXAMINATION: ECOG PERFORMANCE STATUS: 1 - Symptomatic but completely ambulatory  Vitals:   12/01/20 1016  BP: (!) 127/94  Pulse: 90  Resp: 18  Temp: 98.3 F (36.8 C)  SpO2: 100%    Filed Weights   12/01/20 1016  Weight: 152 lb 14.4 oz (69.4 kg)     Telephone Visit: no vitals or physical exam.   LABORATORY DATA:  I have reviewed the data as listed CBC Latest Ref Rng & Units 12/01/2020 11/07/2020 11/06/2020  WBC 4.0 - 10.5 K/uL 4.7 5.8 8.9  Hemoglobin 12.0 - 15.0 g/dL 11.3(L) 9.7(L) 10.0(L)  Hematocrit 36.0 - 46.0 % 34.4(L) 29.0(L) 29.9(L)  Platelets 150 - 400 K/uL 238 156 163    CMP Latest Ref Rng & Units 12/01/2020 11/08/2020 11/07/2020  Glucose 70 - 99 mg/dL 124(H) 93 97  BUN 8 - 23 mg/dL 18 13 15   Creatinine 0.44 - 1.00 mg/dL 0.69 0.75 0.80  Sodium 135 - 145 mmol/L 140 141 141  Potassium 3.5 - 5.1 mmol/L 3.9 3.4(L) 3.5   Chloride 98 - 111 mmol/L 106 108 109  CO2 22 - 32 mmol/L 27 28 26   Calcium 8.9 - 10.3 mg/dL 9.2 8.7(L) 9.1  Total Protein 6.5 - 8.1 g/dL 7.4 - -  Total Bilirubin 0.3 - 1.2 mg/dL 0.4 - -  Alkaline Phos 38 - 126 U/L 128(H) - -  AST 15 - 41 U/L 31 - -  ALT 0 - 44 U/L 24 - -     RADIOGRAPHIC STUDIES: No results found.  ASSESSMENT & PLAN Cheyenne Scott 65 y.o. female with medical history significant for rectal cancer who presents for a follow up visit.  The current plan to treat the patient with FOLFOX chemotherapy every 2 weeks x 8 cycles, for total of 16 weeks for the treatment.  Then we will start chemoradiation with capecitabine 825 mg per metered squared twice daily on days of radiation, typically for about 28 fractions.  Mrs. Stuhr established with Dr. Lisbeth Renshaw who is performing the radiation component of her treatment.  Once this is complete we will reevaluate with repeat imaging in proceed to surgery if feasible.  The patient and her daughter-in-law voiced their understanding of this plan moving forward.   Previously we discussed the FOLFOX chemotherapy treatment and the expected side effects including nausea, vomiting, diarrhea, neutropenia, nerve pain, and hand-foot syndrome.  Discussed that these are the most common and dangerous side effects, but is not limited to these alone.  She has completed both FOLFOX chemotherapy as well as capecitabine with chemoradiation well.  She underwent her surgery on 11/05/2020 with minute residual tumor remaining.  She has negative in her lymph nodes.  Overall findings are consistent with R0 resection.  She will now enter surveillance.   # Rectal Adenocarcainoma, Stage T3N1M0 -- Findings consistent with a stage III T3 N1 M0 rectal adenocarcinoma.  Treatment of choice would be total neoadjuvant chemotherapy/ chemoradiation followed by surgical resection. -- Started Cycle 1 Day 1 of FOLFOX chemotherapy on 03/05/2020. She completed 8 cycles total.  --  initial pretreatment CEA found to be within normal limits, not likely to be of value moving forward.  --started  Xeloda 1500 mg PO BID (825mg /m2) on 07/16/2020. Radiation therapy started on 07/14/2020. Xeloda to be taken on radiation days only.  End date of chemoradiation was 08/21/2020.  --monitor CBC, CMP, and magnesium  --surgery performed 11/05/2020, minute residual tumor remained. Negative lymph nodes.  Plan:  --per NCCN recommendations, will plan for q3-6 month physical exams/clinic visit with q 6 month CT scans x 2 years to assess for recurrence --will order CEA q 3-6 months.  --RTC in 3 months time.   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  -continue to monitor    #Supportive Care --chemotherapy education completed --port in place--can be removed, order placed for removal.  --zofran 8mg  q8H PRN and compazine 10mg  PO q6H for nausea --lomotil for diarrhea -- EMLA cream for port -- no pain medication required at this time.    Orders Placed This Encounter  Procedures   IR Removal Tun Access W/ Port W/O FL    Standing Status:   Future    Standing Expiration Date:   12/01/2021    Order Specific Question:   Reason for exam:    Answer:   completed chemotherapy, requesting port removal    Order Specific Question:   Preferred Imaging Location?    Answer:   [facility]    All questions were answered. The patient knows to call the clinic with any problems, questions or concerns.  A total of more than 30 minutes were spent on this encounter and over half of that time was spent on counseling and coordination of care as outlined above.   Ledell Peoples, MD Department of Hematology/Oncology [place] [facility] Phone: [phone] Pager: [phone] Email: [email or web address]  12/02/2020 5:46 PM

## 2020-12-02 ENCOUNTER — Encounter: Payer: Self-pay | Admitting: Hematology and Oncology

## 2021-02-16 ENCOUNTER — Ambulatory Visit: Payer: Self-pay | Admitting: General Surgery

## 2021-02-16 NOTE — H&P (Signed)
PROVIDER:  Monico Blitz, MD   MRN: [id number] DOB: 25-Jan-1956 DATE OF ENCOUNTER: 02/16/2021 Subjective    Chief Complaint: No chief complaint on file.       History of Present Illness: Cheyenne Scott is a 66 y.o. female who presented to the office with stage III T3N1 rectal cancer.  She had a colonoscopy in December 2021 due to bright red blood per rectum. A mass was found to be ~ 2 cm from anal verge.  Biopsies confirmed well-differentiated adenocarcinoma.  CT scan showed no sign of metastatic disease.  MRI showed a T2-T3 malignancy that appeared to involve the internal sphincter with 2 positive mesorectal nodes.  In January she started neoadjuvant FOLFOX.  She completed this and started chemotherapy and radiation in early June 2022.  She finished this on July 19. Past surgical history consist of a robotic-assisted total hysterectomy, anterior and posterior repair of bladder prolapse and rectocele repair in 2016.  She was taken to the operating room in late September 2022.  A low anterior resection with coloanal anastomosis was performed with repair of a posterior vaginectomy.  She has done well at home.  She is drinking plenty of fluids and having clear urine output.  She has no complaints about her ileostomy.  She is having no pain.    She denies any vaginal drainage      Past Medical History:  Diagnosis Date   Acid reflux    Allergy    Rectal cancer ([place])    Past Surgical History:  Procedure Laterality Date   ANTERIOR AND POSTERIOR REPAIR N/A 01/20/2015   Procedure: ANTERIOR (CYSTOCELE) AND POSTERIOR REPAIR (RECTOCELE) WITH PERINEORRHAPHY AND UTEROSACRAL LIGAMENT  SUSPENSION ;  Surgeon: Brien Few, MD;  Location: [facility];  Service: Gynecology;  Laterality: N/A;   DIVERTING ILEOSTOMY N/A 11/05/2020   Procedure: DIVERTING ILEOSTOMY;  Surgeon: Leighton Ruff, MD;  Location: [facility];  Service: General;  Laterality: N/A;   IR IMAGING GUIDED PORT INSERTION  02/26/2020   ROBOTIC  ASSISTED TOTAL HYSTERECTOMY WITH SALPINGECTOMY Bilateral 01/20/2015   Procedure: ROBOTIC ASSISTED TOTAL HYSTERECTOMY WITH BILATERAL SALPINGECTOMY;  Surgeon: Brien Few, MD;  Location: [facility];  Service: Gynecology;  Laterality: Bilateral;   TUBAL LIGATION     XI ROBOTIC ASSISTED LOWER ANTERIOR RESECTION N/A 11/05/2020   Procedure: XI ROBOTIC ASSISTED LOWER ANTERIOR RESECTION;  Surgeon: Leighton Ruff, MD;  Location: [facility];  Service: General;  Laterality: N/A;   Family History  Problem Relation Age of Onset   Diabetes Mother    Heart disease Mother    Cancer Brother    Stroke Maternal Grandmother    Social History   Socioeconomic History   Marital status: Widowed    Spouse name: Not on file   Number of children: Not on file   Years of education: Not on file   Highest education level: Not on file  Occupational History   Not on file  Tobacco Use   Smoking status: Never   Smokeless tobacco: Never  Vaping Use   Vaping Use: Never used  Substance and Sexual Activity   Alcohol use: No    Alcohol/week: 0.0 standard drinks   Drug use: No   Sexual activity: Not on file  Other Topics Concern   Not on file  Social History Narrative   Not on file   Social Determinants of Health   Financial Resource Strain: Not on file  Food Insecurity: Not on file  Transportation Needs: Not on file  Physical Activity:  Not on file  Stress: Not on file  Social Connections: Not on file  Intimate Partner Violence: Not on file    Current Outpatient Medications:    acetaminophen (TYLENOL) 325 MG tablet, Take 650 mg by mouth every 6 (six) hours as needed for moderate pain., Disp: , Rfl:    ascorbic acid (VITAMIN C) 250 MG tablet, Take 250 mg by mouth daily., Disp: , Rfl:    cetirizine (ZYRTEC) 10 MG tablet, Take 10 mg by mouth daily as needed for allergies., Disp: , Rfl:    Cholecalciferol (VITAMIN D3) 50 MCG (2000 UT) capsule, Take 2,000 Units by mouth daily., Disp: , Rfl:    estradiol (ESTRACE)  0.1 MG/GM vaginal cream, Place 0.5 Applicatorfuls vaginally 2 (two) times a week., Disp: , Rfl:    L-Lysine 500 MG TABS, Take 500 mg by mouth daily., Disp: , Rfl:    Multiple Vitamin (MULTIVITAMIN WITH MINERALS) TABS tablet, Take 1 tablet by mouth daily., Disp: , Rfl:    omeprazole (PRILOSEC) 20 MG capsule, Take 20 mg by mouth daily., Disp: , Rfl:    traMADol (ULTRAM) 50 MG tablet, Take 1-2 tablets (50-100 mg total) by mouth every 6 (six) hours as needed for moderate pain., Disp: 30 tablet, Rfl: 0   zinc gluconate 50 MG tablet, Take 50 mg by mouth daily., Disp: , Rfl:  Allergies  Allergen Reactions   Penicillins Rash    Childhood reaction   Review of Systems - Negative except as stated above   Objective:          General appearance - alert, well appearing, and in no distress CV: RRR Lungs: CTA Abd: soft, ileostomy in place Rectal - Coloanal anastomosis healing appropriately     Labs, Imaging and Diagnostic Testing:     Procedure: Anoscopy Surgeon: Marcello Moores After the risks and benefits were explained, written consent was obtained for above procedure.  A medical assistant chaperone was present thoroughout the entire procedure.  Anesthesia: none Diagnosis: coloanal anastomosis Findings: Well-healed anastomosis and well-healed vaginotomy scar   Assessment and Plan:  There are no diagnoses linked to this encounter.    Cheyenne Scott is a 66 y.o. female who underwent LAR with posterior vaginectomy and diverting ileostomy in late Sept 22.  She is healing appropriately. She has completed all of her cancer therapy.   I believe she is ready for ileostomy reversal.  We will plan on doing this later this month or early next month.  We discussed removing her port as well during surgery.  We have discussed al of this in detail including risk of anastomotic leak, hernia and wound infections. Postoperative bowel habits and expectations were also discussed in detail. All questions were answered.

## 2021-02-16 NOTE — H&P (View-Only) (Signed)
PROVIDER:  Monico Blitz, MD   MRN: [id number] DOB: [birth year] DATE OF ENCOUNTER: 02/16/2021 Subjective    Chief Complaint: No chief complaint on file.       History of Present Illness: Cheyenne Scott is a 66 y.o. female who presented to the office with stage III T3N1 rectal cancer.  She had a colonoscopy in December 2021 due to bright red blood per rectum. A mass was found to be ~ 2 cm from anal verge.  Biopsies confirmed well-differentiated adenocarcinoma.  CT scan showed no sign of metastatic disease.  MRI showed a T2-T3 malignancy that appeared to involve the internal sphincter with 2 positive mesorectal nodes.  In January she started neoadjuvant FOLFOX.  She completed this and started chemotherapy and radiation in early June 2022.  She finished this on July 19. Past surgical history consist of a robotic-assisted total hysterectomy, anterior and posterior repair of bladder prolapse and rectocele repair in 2016.  She was taken to the operating room in late September 2022.  A low anterior resection with coloanal anastomosis was performed with repair of a posterior vaginectomy.  She has done well at home.  She is drinking plenty of fluids and having clear urine output.  She has no complaints about her ileostomy.  She is having no pain.    She denies any vaginal drainage      Past Medical History:  Diagnosis Date   Acid reflux    Allergy    Rectal cancer ([place])    Past Surgical History:  Procedure Laterality Date   ANTERIOR AND POSTERIOR REPAIR N/A 01/20/2015   Procedure: ANTERIOR (CYSTOCELE) AND POSTERIOR REPAIR (RECTOCELE) WITH PERINEORRHAPHY AND UTEROSACRAL LIGAMENT  SUSPENSION ;  Surgeon: Brien Few, MD;  Location: [facility];  Service: Gynecology;  Laterality: N/A;   DIVERTING ILEOSTOMY N/A 11/05/2020   Procedure: DIVERTING ILEOSTOMY;  Surgeon: Leighton Ruff, MD;  Location: [facility];  Service: General;  Laterality: N/A;   IR IMAGING GUIDED PORT INSERTION  02/26/2020   ROBOTIC  ASSISTED TOTAL HYSTERECTOMY WITH SALPINGECTOMY Bilateral 01/20/2015   Procedure: ROBOTIC ASSISTED TOTAL HYSTERECTOMY WITH BILATERAL SALPINGECTOMY;  Surgeon: Brien Few, MD;  Location: [facility];  Service: Gynecology;  Laterality: Bilateral;   TUBAL LIGATION     XI ROBOTIC ASSISTED LOWER ANTERIOR RESECTION N/A 11/05/2020   Procedure: XI ROBOTIC ASSISTED LOWER ANTERIOR RESECTION;  Surgeon: Leighton Ruff, MD;  [facility]: [facility];  Service: General;  Laterality: N/A;   Family History  Problem Relation Age of Onset   Diabetes Mother    Heart disease Mother    Cancer Brother    Stroke Maternal Grandmother    Social History   Socioeconomic History   Marital status: Widowed    Spouse name: Not on file   Number of children: Not on file   Years of education: Not on file   Highest education level: Not on file  Occupational History   Not on file  Tobacco Use   Smoking status: Never   Smokeless tobacco: Never  Vaping Use   Vaping Use: Never used  Substance and Sexual Activity   Alcohol use: No    Alcohol/week: 0.0 standard drinks   Drug use: No   Sexual activity: Not on file  Other Topics Concern   Not on file  Social History Narrative   Not on file   Social Determinants of Health   Financial Resource Strain: Not on file  Food Insecurity: Not on file  Transportation Needs: Not on file  Physical Activity:  Not on file  Stress: Not on file  Social Connections: Not on file  Intimate Partner Violence: Not on file    Current Outpatient Medications:    acetaminophen (TYLENOL) 325 MG tablet, Take 650 mg by mouth every 6 (six) hours as needed for moderate pain., Disp: , Rfl:    ascorbic acid (VITAMIN C) 250 MG tablet, Take 250 mg by mouth daily., Disp: , Rfl:    cetirizine (ZYRTEC) 10 MG tablet, Take 10 mg by mouth daily as needed for allergies., Disp: , Rfl:    Cholecalciferol (VITAMIN D3) 50 MCG (2000 UT) capsule, Take 2,000 Units by mouth daily., Disp: , Rfl:    estradiol (ESTRACE)  0.1 MG/GM vaginal cream, Place 0.5 Applicatorfuls vaginally 2 (two) times a week., Disp: , Rfl:    L-Lysine 500 MG TABS, Take 500 mg by mouth daily., Disp: , Rfl:    Multiple Vitamin (MULTIVITAMIN WITH MINERALS) TABS tablet, Take 1 tablet by mouth daily., Disp: , Rfl:    omeprazole (PRILOSEC) 20 MG capsule, Take 20 mg by mouth daily., Disp: , Rfl:    traMADol (ULTRAM) 50 MG tablet, Take 1-2 tablets (50-100 mg total) by mouth every 6 (six) hours as needed for moderate pain., Disp: 30 tablet, Rfl: 0   zinc gluconate 50 MG tablet, Take 50 mg by mouth daily., Disp: , Rfl:  Allergies  Allergen Reactions   Penicillins Rash    Childhood reaction   Review of Systems - Negative except as stated above   Objective:          General appearance - alert, well appearing, and in no distress CV: RRR Lungs: CTA Abd: soft, ileostomy in place Rectal - Coloanal anastomosis healing appropriately     Labs, Imaging and Diagnostic Testing:     Procedure: Anoscopy Surgeon: Marcello Moores After the risks and benefits were explained, written consent was obtained for above procedure.  A medical assistant chaperone was present thoroughout the entire procedure.  Anesthesia: none Diagnosis: coloanal anastomosis Findings: Well-healed anastomosis and well-healed vaginotomy scar   Assessment and Plan:  There are no diagnoses linked to this encounter.    Cheyenne Scott is a 66 y.o. female who underwent LAR with posterior vaginectomy and diverting ileostomy in late Sept 22.  She is healing appropriately. She has completed all of her cancer therapy.   I believe she is ready for ileostomy reversal.  We will plan on doing this later this month or early next month.  We discussed removing her port as well during surgery.  We have discussed al of this in detail including risk of anastomotic leak, hernia and wound infections. Postoperative bowel habits and expectations were also discussed in detail. All questions were answered.

## 2021-02-20 ENCOUNTER — Telehealth: Payer: Self-pay | Admitting: Hematology and Oncology

## 2021-02-20 NOTE — Telephone Encounter (Signed)
Scheduled per 1/12 rn msg, message has been left with pt

## 2021-02-24 ENCOUNTER — Telehealth: Payer: Self-pay | Admitting: *Deleted

## 2021-02-25 ENCOUNTER — Telehealth: Payer: Self-pay | Admitting: Hematology and Oncology

## 2021-02-25 NOTE — Telephone Encounter (Signed)
Sch per 1/18 inbasket, pt aware

## 2021-03-02 NOTE — Progress Notes (Addendum)
PCP - Jessie Foot Cardiologist - no  PPM/ICD -  Device Orders -  Rep Notified -   Chest x-ray -  EKG -  Stress Test -  ECHO -  Cardiac Cath -   Sleep Study -  CPAP -   Fasting Blood Sugar -  Checks Blood Sugar _____ times a day  Blood Thinner Instructions: Aspirin Instructions:  ERAS Protcol - PRE-SURGERY Ensure or G2-   COVID TEST- 03-12-11 COVID vaccine -  Activity--Able to walk a flight of stairs without SOB Anesthesia review:   Patient denies shortness of breath, fever, cough and chest pain at PAT appointment   All instructions explained to the patient, with a verbal understanding of the material. Patient agrees to go over the instructions while at home for a better understanding. Patient also instructed to self quarantine after being tested for COVID-19. The opportunity to ask questions was provided.

## 2021-03-02 NOTE — Patient Instructions (Addendum)
DUE TO COVID-19 ONLY ONE VISITOR IS ALLOWED TO COME WITH YOU AND STAY IN THE WAITING ROOM ONLY DURING PRE OP AND PROCEDURE DAY OF SURGERY.   Up to two visitors ages 16+ are allowed at one time in a patient's room.  The visitors may rotate out with other people throughout the day.  Additionally, up to two children between the ages of 18 and 39 are allowed and do not count toward the number of allowed visitors.  Children within this age range must be accompanied by an adult visitor.  One adult visitor may remain with the patient overnight and must be in the room by 8 PM.  YOU NEED TO HAVE A COVID 19 TEST ON__2-1-23 @ 0815 am_____ THIS TEST MUST BE DONE BEFORE SURGERY,     COVID TESTING [place] COME IN THROUGH MAIN ENTRANCE BE SEATED INT THE LOBBY AREA TO THE RIGHT AS YOU COME IN THE MAIN ENTRANCE DIAL [phone] GIVE THEM YOUR NAME AND LET THEM KNOW YOU ARE HERE FOR COVID TESTING    ONCE YOUR COVID TEST IS COMPLETED,  PLEASE Wear a mask when in [place]           Your procedure is scheduled on: 03-13-21   Report to [facility] Main  Entrance   Report to admitting at       0600 AM     Call this number if you have problems the morning of surgery [phone]   Remember: Follow bowel prep per MD order Follow a clear liquid diet the day of your bowel prep to prevent dehydration.   NOTHING BY MOUTH AFTER 0545 AM THE DAY OF YOUR SURGERY  DRINK 2 PRESURGERY ENSURE DRINKS THE NIGHT BEFORE SURGERY AT  1000 PM AND 1 PRESURGERY DRINK THE DAY OF THE PROCEDURE 3 HOURS PRIOR TO SCHEDULED SURGERY. NO SOLIDS AFTER MIDNIGHT THE DAY PRIOR TO THE SURGERY. NOTHING BY MOUTH EXCEPT CLEAR LIQUIDS UNTIL THREE HOURS PRIOR TO SCHEDULED SURGERY. PLEASE FINISH PRESURGERY ENSURE DRINK PER SURGEON ORDER 3 HOURS PRIOR TO SCHEDULED SURGERY TIME WHICH NEEDS TO BE COMPLETED AT __0545 am then nothing by mouth_______.   CLEAR LIQUID DIET                                                                     water Black Coffee and tea, regular and decaf No Creamer                            Plain Jell-O any favor except red or purple                                  Fruit ices (not with fruit pulp)                                      Iced Popsicles  Cranberry, grape and apple juices Sports drinks like Gatorade Lightly seasoned clear broth or consume(fat free) Sugar, honey syrup  Sample Menu Breakfast                                Lunch                                     Supper Cranberry juice                    Beef broth                            Chicken broth Jell-O                                     Grape juice                           Apple juice Coffee or tea                        Jell-O                                      Popsicle                                                Coffee or tea                        Coffee or tea  _____________________________________________________________________      BRUSH YOUR TEETH MORNING OF SURGERY AND RINSE YOUR MOUTH OUT, NO CHEWING GUM CANDY OR MINTS.     Take these medicines the morning of surgery with A SIP OF WATER: omeprazole, and tylenol if needed                                 You may not have any metal on your body including hair pins and              piercings  Do not wear jewelry, make-up, lotions, powders,perfumes,        deodorant             Do not wear nail polish on your fingernails or toenails .  Do not shave  48 hours prior to surgery.           Do not bring valuables to the hospital. [place].  Contacts, dentures or bridgework may not be worn into surgery.  You may bring a small overnight bag with you     Patients discharged the day of surgery will not be allowed to drive home. IF YOU ARE HAVING SURGERY AND GOING HOME THE SAME DAY, YOU MUST HAVE AN ADULT  TO DRIVE  YOU HOME AND BE WITH YOU FOR 24 HOURS. YOU MAY GO HOME BY TAXI OR UBER OR ORTHERWISE, BUT AN ADULT MUST ACCOMPANY YOU HOME AND STAY WITH YOU FOR 24 HOURS.  Name and phone number of your driver:  Special Instructions: N/A              Please read over the following fact sheets you were given: _____________________________________________________________________             [facility] - Preparing for Surgery Before surgery, you can play an important role.  Because skin is not sterile, your skin needs to be as free of germs as possible.  You can reduce the number of germs on your skin by washing with CHG (chlorahexidine gluconate) soap before surgery.  CHG is an antiseptic cleaner which kills germs and bonds with the skin to continue killing germs even after washing. Please DO NOT use if you have an allergy to CHG or antibacterial soaps.  If your skin becomes reddened/irritated stop using the CHG and inform your nurse when you arrive at Short Stay. Do not shave (including legs and underarms) for at least 48 hours prior to the first CHG shower.  You may shave your face/neck. Please follow these instructions carefully:  1.  Shower with CHG Soap the night before surgery and the  morning of Surgery.  2.  If you choose to wash your hair, wash your hair first as usual with your  normal  shampoo.  3.  After you shampoo, rinse your hair and body thoroughly to remove the  shampoo.                           4.  Use CHG as you would any other liquid soap.  You can apply chg directly  to the skin and wash                       Gently with a scrungie or clean washcloth.  5.  Apply the CHG Soap to your body ONLY FROM THE NECK DOWN.   Do not use on face/ open                           Wound or open sores. Avoid contact with eyes, ears mouth and genitals (private parts).                       Wash face,  Genitals (private parts) with your normal soap.             6.  Wash thoroughly, paying special  attention to the area where your surgery  will be performed.  7.  Thoroughly rinse your body with warm water from the neck down.  8.  DO NOT shower/wash with your normal soap after using and rinsing off  the CHG Soap.                9.  Pat yourself dry with a clean towel.            10.  Wear clean pajamas.            11.  Place clean sheets on your bed the night of your first shower and do not  sleep with pets. Day of Surgery : Do not apply any lotions/deodorants the morning of surgery.  Please wear clean clothes to the  hospital/surgery center.  FAILURE TO FOLLOW THESE INSTRUCTIONS MAY RESULT IN THE CANCELLATION OF YOUR SURGERY PATIENT SIGNATURE_________________________________  NURSE SIGNATURE__________________________________  ________________________________________________________________________

## 2021-03-06 ENCOUNTER — Other Ambulatory Visit: Payer: Self-pay

## 2021-03-06 ENCOUNTER — Encounter (HOSPITAL_COMMUNITY): Payer: Self-pay

## 2021-03-06 ENCOUNTER — Encounter (HOSPITAL_COMMUNITY)
Admission: RE | Admit: 2021-03-06 | Discharge: 2021-03-06 | Disposition: A | Discharge status: Home / Self Care | Payer: Medicare Other | Source: Ambulatory Visit | Attending: General Surgery | Admitting: General Surgery

## 2021-03-06 VITALS — BP 140/78 | HR 73 | Temp 98.2°F | Resp 16 | Ht 63.0 in | Wt 148.0 lb

## 2021-03-06 DIAGNOSIS — Z01818 Encounter for other preprocedural examination: Secondary | ICD-10-CM

## 2021-03-06 DIAGNOSIS — Z01812 Encounter for preprocedural laboratory examination: Secondary | ICD-10-CM | POA: Insufficient documentation

## 2021-03-06 HISTORY — DX: Myoneural disorder, unspecified: G70.9

## 2021-03-06 LAB — CBC
HCT: 39.4 % (ref 36.0–46.0)
Hemoglobin: 12.9 g/dL (ref 12.0–15.0)
MCH: 30.1 pg (ref 26.0–34.0)
MCHC: 32.7 g/dL (ref 30.0–36.0)
MCV: 92.1 fL (ref 80.0–100.0)
Platelets: 222 10*3/uL (ref 150–400)
RBC: 4.28 MIL/uL (ref 3.87–5.11)
RDW: 13.8 % (ref 11.5–15.5)
WBC: 5.2 10*3/uL (ref 4.0–10.5)
nRBC: 0 % (ref 0.0–0.2)

## 2021-03-09 ENCOUNTER — Inpatient Hospital Stay: Payer: Medicare Other

## 2021-03-09 ENCOUNTER — Inpatient Hospital Stay: Payer: Medicare Other | Admitting: Hematology and Oncology

## 2021-03-11 ENCOUNTER — Encounter (HOSPITAL_COMMUNITY)
Admission: RE | Admit: 2021-03-11 | Discharge: 2021-03-11 | Disposition: A | Discharge status: Home / Self Care | Payer: Medicare Other | Source: Ambulatory Visit | Attending: General Surgery | Admitting: General Surgery

## 2021-03-11 ENCOUNTER — Other Ambulatory Visit: Payer: Self-pay

## 2021-03-11 DIAGNOSIS — Z01812 Encounter for preprocedural laboratory examination: Secondary | ICD-10-CM | POA: Insufficient documentation

## 2021-03-11 DIAGNOSIS — Z20822 Contact with and (suspected) exposure to covid-19: Secondary | ICD-10-CM | POA: Insufficient documentation

## 2021-03-11 LAB — SARS CORONAVIRUS 2 (TAT 6-24 HRS): SARS Coronavirus 2: NEGATIVE

## 2021-03-12 NOTE — Anesthesia Preprocedure Evaluation (Addendum)
Anesthesia Evaluation  Patient identified by MRN, date of birth, ID band Patient awake    Reviewed: Allergy & Precautions, NPO status , Patient's Chart, lab work & pertinent test results  History of Anesthesia Complications Negative for: history of anesthetic complications  Airway Mallampati: II  TM Distance: >3 FB Neck ROM: Full    Dental  (+) Teeth Intact, Dental Advisory Given   Pulmonary neg pulmonary ROS,    Pulmonary exam normal        Cardiovascular negative cardio ROS Normal cardiovascular exam     Neuro/Psych negative neurological ROS     GI/Hepatic Neg liver ROS, GERD  ,Rectal cancer s/p LAR & chemo   Endo/Other  negative endocrine ROS  Renal/GU negative Renal ROS  negative genitourinary   Musculoskeletal negative musculoskeletal ROS (+)   Abdominal   Peds  Hematology negative hematology ROS (+)   Anesthesia Other Findings   Reproductive/Obstetrics                            Anesthesia Physical Anesthesia Plan  ASA: 2  Anesthesia Plan: General   Post-op Pain Management: Tylenol PO (pre-op), Toradol IV (intra-op) and Lidocaine infusion   Induction: Intravenous  PONV Risk Score and Plan: 4 or greater and Ondansetron, Dexamethasone, Treatment may vary due to age or medical condition and Midazolam  Airway Management Planned: Oral ETT  Additional Equipment: None  Intra-op Plan:   Post-operative Plan: Extubation in OR  Informed Consent: I have reviewed the patients History and Physical, chart, labs and discussed the procedure including the risks, benefits and alternatives for the proposed anesthesia with the patient or authorized representative who has indicated his/her understanding and acceptance.     Dental advisory given  Plan Discussed with:   Anesthesia Plan Comments:        Anesthesia Quick Evaluation

## 2021-03-13 ENCOUNTER — Encounter (HOSPITAL_COMMUNITY): Payer: Self-pay | Admitting: General Surgery

## 2021-03-13 ENCOUNTER — Other Ambulatory Visit: Payer: Self-pay

## 2021-03-13 ENCOUNTER — Inpatient Hospital Stay (HOSPITAL_COMMUNITY): Payer: Medicare Other | Admitting: Anesthesiology

## 2021-03-13 ENCOUNTER — Inpatient Hospital Stay (HOSPITAL_COMMUNITY)
Admission: RE | Admit: 2021-03-13 | Discharge: 2021-03-18 | DRG: 330 | Disposition: A | Discharge status: Home / Self Care | Payer: Medicare Other | Source: Ambulatory Visit | Attending: General Surgery | Admitting: General Surgery

## 2021-03-13 ENCOUNTER — Encounter (HOSPITAL_COMMUNITY)
Admission: RE | Disposition: A | Discharge status: Home / Self Care | Payer: Self-pay | Source: Ambulatory Visit | Attending: General Surgery

## 2021-03-13 DIAGNOSIS — Z79899 Other long term (current) drug therapy: Secondary | ICD-10-CM

## 2021-03-13 DIAGNOSIS — Z85048 Personal history of other malignant neoplasm of rectum, rectosigmoid junction, and anus: Secondary | ICD-10-CM

## 2021-03-13 DIAGNOSIS — Z90710 Acquired absence of both cervix and uterus: Secondary | ICD-10-CM | POA: Diagnosis not present

## 2021-03-13 DIAGNOSIS — Z432 Encounter for attention to ileostomy: Principal | ICD-10-CM

## 2021-03-13 DIAGNOSIS — Z79818 Long term (current) use of other agents affecting estrogen receptors and estrogen levels: Secondary | ICD-10-CM

## 2021-03-13 DIAGNOSIS — Z923 Personal history of irradiation: Secondary | ICD-10-CM | POA: Diagnosis not present

## 2021-03-13 DIAGNOSIS — Z880 Allergy status to penicillin: Secondary | ICD-10-CM | POA: Diagnosis not present

## 2021-03-13 DIAGNOSIS — Z9221 Personal history of antineoplastic chemotherapy: Secondary | ICD-10-CM

## 2021-03-13 DIAGNOSIS — Z809 Family history of malignant neoplasm, unspecified: Secondary | ICD-10-CM | POA: Diagnosis not present

## 2021-03-13 DIAGNOSIS — K567 Ileus, unspecified: Secondary | ICD-10-CM | POA: Diagnosis not present

## 2021-03-13 DIAGNOSIS — Z932 Ileostomy status: Secondary | ICD-10-CM

## 2021-03-13 DIAGNOSIS — Z20822 Contact with and (suspected) exposure to covid-19: Secondary | ICD-10-CM | POA: Diagnosis present

## 2021-03-13 DIAGNOSIS — K219 Gastro-esophageal reflux disease without esophagitis: Secondary | ICD-10-CM | POA: Diagnosis present

## 2021-03-13 DIAGNOSIS — K9189 Other postprocedural complications and disorders of digestive system: Secondary | ICD-10-CM

## 2021-03-13 HISTORY — PX: ILEOSTOMY CLOSURE: SHX1784

## 2021-03-13 HISTORY — PX: PORT-A-CATH REMOVAL: SHX5289

## 2021-03-13 SURGERY — CLOSURE, ILEOSTOMY
Anesthesia: General

## 2021-03-13 MED ORDER — ALUM & MAG HYDROXIDE-SIMETH 200-200-20 MG/5ML PO SUSP: 30.0000 mL | Freq: Four times a day (QID) | ORAL | Status: DC | PRN

## 2021-03-13 MED ORDER — FENTANYL CITRATE PF 50 MCG/ML IJ SOSY
PREFILLED_SYRINGE | INTRAMUSCULAR | Status: AC
  Administered 2021-03-13: 50 ug via INTRAVENOUS
  Filled 2021-03-13: qty 3

## 2021-03-13 MED ORDER — SODIUM CHLORIDE 0.9 % IV SOLN
2.0000 g | INTRAVENOUS | Status: AC
  Administered 2021-03-13: 2 g via INTRAVENOUS
  Filled 2021-03-13: qty 2

## 2021-03-13 MED ORDER — LIDOCAINE HCL 2 % IJ SOLN
INTRAMUSCULAR | Status: AC
  Filled 2021-03-13: qty 20

## 2021-03-13 MED ORDER — ROCURONIUM BROMIDE 10 MG/ML (PF) SYRINGE
PREFILLED_SYRINGE | INTRAVENOUS | Status: AC
  Filled 2021-03-13: qty 10

## 2021-03-13 MED ORDER — ENSURE SURGERY PO LIQD
237.0000 mL | Freq: Two times a day (BID) | ORAL | Status: DC
  Administered 2021-03-14 (×2): 237 mL via ORAL

## 2021-03-13 MED ORDER — DEXAMETHASONE SODIUM PHOSPHATE 10 MG/ML IJ SOLN
INTRAMUSCULAR | Status: AC
  Filled 2021-03-13: qty 1

## 2021-03-13 MED ORDER — KETOROLAC TROMETHAMINE 30 MG/ML IJ SOLN
15.0000 mg | Freq: Three times a day (TID) | INTRAMUSCULAR | Status: AC
  Administered 2021-03-13 – 2021-03-14 (×3): 15 mg via INTRAVENOUS
  Filled 2021-03-13 (×3): qty 1

## 2021-03-13 MED ORDER — OXYCODONE HCL 5 MG PO TABS: 5.0000 mg | ORAL_TABLET | Freq: Once | ORAL | Status: DC | PRN

## 2021-03-13 MED ORDER — SUGAMMADEX SODIUM 200 MG/2ML IV SOLN
INTRAVENOUS | Status: DC | PRN
  Administered 2021-03-13: 200 mg via INTRAVENOUS

## 2021-03-13 MED ORDER — PANTOPRAZOLE SODIUM 40 MG PO TBEC
40.0000 mg | DELAYED_RELEASE_TABLET | Freq: Every day | ORAL | Status: DC
  Administered 2021-03-13 – 2021-03-15 (×3): 40 mg via ORAL
  Filled 2021-03-13 (×2): qty 1

## 2021-03-13 MED ORDER — DEXAMETHASONE SODIUM PHOSPHATE 10 MG/ML IJ SOLN
INTRAMUSCULAR | Status: DC | PRN
  Administered 2021-03-13: 10 mg via INTRAVENOUS

## 2021-03-13 MED ORDER — AMISULPRIDE (ANTIEMETIC) 5 MG/2ML IV SOLN
INTRAVENOUS | Status: AC
  Filled 2021-03-13: qty 4

## 2021-03-13 MED ORDER — FENTANYL CITRATE (PF) 100 MCG/2ML IJ SOLN
INTRAMUSCULAR | Status: DC | PRN
  Administered 2021-03-13: 100 ug via INTRAVENOUS

## 2021-03-13 MED ORDER — METHOCARBAMOL 500 MG IVPB - SIMPLE MED
500.0000 mg | Freq: Three times a day (TID) | INTRAVENOUS | Status: DC | PRN
  Filled 2021-03-13: qty 100

## 2021-03-13 MED ORDER — ALVIMOPAN 12 MG PO CAPS: 12.0000 mg | ORAL_CAPSULE | Freq: Two times a day (BID) | ORAL | Status: DC

## 2021-03-13 MED ORDER — BUPIVACAINE LIPOSOME 1.3 % IJ SUSP
INTRAMUSCULAR | Status: DC | PRN
  Administered 2021-03-13: 20 mL

## 2021-03-13 MED ORDER — KETOROLAC TROMETHAMINE 15 MG/ML IJ SOLN
15.0000 mg | Freq: Four times a day (QID) | INTRAMUSCULAR | Status: AC | PRN
  Administered 2021-03-15 – 2021-03-16 (×4): 15 mg via INTRAVENOUS
  Filled 2021-03-13 (×4): qty 1

## 2021-03-13 MED ORDER — ENOXAPARIN SODIUM 40 MG/0.4ML IJ SOSY
40.0000 mg | PREFILLED_SYRINGE | INTRAMUSCULAR | Status: DC
  Administered 2021-03-14 – 2021-03-18 (×4): 40 mg via SUBCUTANEOUS
  Filled 2021-03-13 (×5): qty 0.4

## 2021-03-13 MED ORDER — KETAMINE HCL 50 MG/5ML IJ SOSY
PREFILLED_SYRINGE | INTRAMUSCULAR | Status: AC
  Filled 2021-03-13: qty 5

## 2021-03-13 MED ORDER — MIDAZOLAM HCL 2 MG/2ML IJ SOLN
INTRAMUSCULAR | Status: AC
  Filled 2021-03-13: qty 2

## 2021-03-13 MED ORDER — ACETAMINOPHEN 500 MG PO TABS
1000.0000 mg | ORAL_TABLET | ORAL | Status: AC
  Administered 2021-03-13: 1000 mg via ORAL
  Filled 2021-03-13: qty 2

## 2021-03-13 MED ORDER — FENTANYL CITRATE (PF) 100 MCG/2ML IJ SOLN
INTRAMUSCULAR | Status: AC
  Filled 2021-03-13: qty 2

## 2021-03-13 MED ORDER — BUPIVACAINE-EPINEPHRINE (PF) 0.25% -1:200000 IJ SOLN
INTRAMUSCULAR | Status: AC
  Filled 2021-03-13: qty 30

## 2021-03-13 MED ORDER — PROMETHAZINE HCL 25 MG/ML IJ SOLN
INTRAMUSCULAR | Status: AC
  Filled 2021-03-13: qty 1

## 2021-03-13 MED ORDER — PHENYLEPHRINE 40 MCG/ML (10ML) SYRINGE FOR IV PUSH (FOR BLOOD PRESSURE SUPPORT)
PREFILLED_SYRINGE | INTRAVENOUS | Status: AC
  Filled 2021-03-13: qty 10

## 2021-03-13 MED ORDER — SIMETHICONE 80 MG PO CHEW
40.0000 mg | CHEWABLE_TABLET | Freq: Four times a day (QID) | ORAL | Status: DC | PRN
  Administered 2021-03-18: 40 mg via ORAL
  Filled 2021-03-13 (×2): qty 1

## 2021-03-13 MED ORDER — SACCHAROMYCES BOULARDII 250 MG PO CAPS
250.0000 mg | ORAL_CAPSULE | Freq: Two times a day (BID) | ORAL | Status: DC
  Administered 2021-03-13 – 2021-03-18 (×6): 250 mg via ORAL
  Filled 2021-03-13 (×8): qty 1

## 2021-03-13 MED ORDER — LACTATED RINGERS IV SOLN: INTRAVENOUS | Status: DC

## 2021-03-13 MED ORDER — FENTANYL CITRATE PF 50 MCG/ML IJ SOSY
25.0000 ug | PREFILLED_SYRINGE | INTRAMUSCULAR | Status: DC | PRN
  Administered 2021-03-13: 50 ug via INTRAVENOUS

## 2021-03-13 MED ORDER — KETAMINE HCL 10 MG/ML IJ SOLN
INTRAMUSCULAR | Status: DC | PRN
Start: 2021-03-13 — End: 2021-03-13
  Administered 2021-03-13 (×2): 20 mg via INTRAVENOUS

## 2021-03-13 MED ORDER — OXYCODONE HCL 5 MG PO TABS
ORAL_TABLET | ORAL | Status: AC
  Filled 2021-03-13: qty 1

## 2021-03-13 MED ORDER — ENSURE PRE-SURGERY PO LIQD
592.0000 mL | Freq: Once | ORAL | Status: DC
  Administered 2021-03-13: 592 mL via ORAL
  Filled 2021-03-13: qty 592

## 2021-03-13 MED ORDER — ONDANSETRON HCL 4 MG/2ML IJ SOLN
INTRAMUSCULAR | Status: AC
  Filled 2021-03-13: qty 2

## 2021-03-13 MED ORDER — MIDAZOLAM HCL 5 MG/5ML IJ SOLN
INTRAMUSCULAR | Status: DC | PRN
  Administered 2021-03-13: 2 mg via INTRAVENOUS

## 2021-03-13 MED ORDER — TRAMADOL HCL 50 MG PO TABS
50.0000 mg | ORAL_TABLET | Freq: Four times a day (QID) | ORAL | Status: DC | PRN
  Filled 2021-03-13: qty 2

## 2021-03-13 MED ORDER — EPHEDRINE 5 MG/ML INJ
INTRAVENOUS | Status: AC
  Filled 2021-03-13: qty 5

## 2021-03-13 MED ORDER — ROCURONIUM BROMIDE 10 MG/ML (PF) SYRINGE
PREFILLED_SYRINGE | INTRAVENOUS | Status: DC | PRN
  Administered 2021-03-13: 70 mg via INTRAVENOUS

## 2021-03-13 MED ORDER — GABAPENTIN 300 MG PO CAPS
300.0000 mg | ORAL_CAPSULE | Freq: Two times a day (BID) | ORAL | Status: DC
  Administered 2021-03-13 – 2021-03-18 (×6): 300 mg via ORAL
  Filled 2021-03-13 (×8): qty 1

## 2021-03-13 MED ORDER — PROPOFOL 10 MG/ML IV BOLUS
INTRAVENOUS | Status: AC
  Filled 2021-03-13: qty 20

## 2021-03-13 MED ORDER — 0.9 % SODIUM CHLORIDE (POUR BTL) OPTIME
TOPICAL | Status: DC | PRN
  Administered 2021-03-13: 1000 mL

## 2021-03-13 MED ORDER — ENSURE PRE-SURGERY PO LIQD
296.0000 mL | Freq: Once | ORAL | Status: DC
  Administered 2021-03-13: 296 mL via ORAL
  Filled 2021-03-13: qty 296

## 2021-03-13 MED ORDER — PHENYLEPHRINE 40 MCG/ML (10ML) SYRINGE FOR IV PUSH (FOR BLOOD PRESSURE SUPPORT)
PREFILLED_SYRINGE | INTRAVENOUS | Status: DC | PRN
  Administered 2021-03-13: 40 ug via INTRAVENOUS
  Administered 2021-03-13 (×2): 80 ug via INTRAVENOUS

## 2021-03-13 MED ORDER — KCL IN DEXTROSE-NACL 20-5-0.45 MEQ/L-%-% IV SOLN
INTRAVENOUS | Status: DC
  Filled 2021-03-13 (×2): qty 1000

## 2021-03-13 MED ORDER — BUPIVACAINE LIPOSOME 1.3 % IJ SUSP
INTRAMUSCULAR | Status: AC
  Filled 2021-03-13: qty 20

## 2021-03-13 MED ORDER — ONDANSETRON HCL 4 MG/2ML IJ SOLN
INTRAMUSCULAR | Status: DC | PRN
  Administered 2021-03-13: 4 mg via INTRAVENOUS

## 2021-03-13 MED ORDER — BUPIVACAINE LIPOSOME 1.3 % IJ SUSP
20.0000 mL | Freq: Once | INTRAMUSCULAR | Status: DC
  Administered 2021-03-13: 266 mg

## 2021-03-13 MED ORDER — ACETAMINOPHEN 325 MG PO TABS
650.0000 mg | ORAL_TABLET | Freq: Four times a day (QID) | ORAL | Status: DC | PRN
  Administered 2021-03-13 – 2021-03-14 (×3): 650 mg via ORAL
  Filled 2021-03-13 (×4): qty 2

## 2021-03-13 MED ORDER — GABAPENTIN 300 MG PO CAPS
300.0000 mg | ORAL_CAPSULE | ORAL | Status: AC
  Administered 2021-03-13: 300 mg via ORAL
  Filled 2021-03-13: qty 1

## 2021-03-13 MED ORDER — OXYCODONE HCL 5 MG/5ML PO SOLN
5.0000 mg | Freq: Once | ORAL | Status: DC | PRN
Start: 2021-03-13 — End: 2021-03-13

## 2021-03-13 MED ORDER — PROMETHAZINE HCL 25 MG/ML IJ SOLN
6.2500 mg | Freq: Once | INTRAMUSCULAR | Status: AC
  Administered 2021-03-13: 6.25 mg via INTRAVENOUS

## 2021-03-13 MED ORDER — ONDANSETRON HCL 4 MG/2ML IJ SOLN
4.0000 mg | Freq: Four times a day (QID) | INTRAMUSCULAR | Status: DC | PRN
  Administered 2021-03-14 – 2021-03-15 (×2): 4 mg via INTRAVENOUS
  Filled 2021-03-13 (×2): qty 2

## 2021-03-13 MED ORDER — ACETAMINOPHEN 500 MG PO TABS
1000.0000 mg | ORAL_TABLET | Freq: Once | ORAL | Status: DC
  Administered 2021-03-13: 1000 mg via ORAL

## 2021-03-13 MED ORDER — ALVIMOPAN 12 MG PO CAPS
12.0000 mg | ORAL_CAPSULE | ORAL | Status: AC
  Administered 2021-03-13: 12 mg via ORAL
  Filled 2021-03-13: qty 1

## 2021-03-13 MED ORDER — ORAL CARE MOUTH RINSE: 15.0000 mL | Freq: Once | OROMUCOSAL | Status: AC

## 2021-03-13 MED ORDER — ONDANSETRON HCL 4 MG/2ML IJ SOLN
4.0000 mg | Freq: Once | INTRAMUSCULAR | Status: AC | PRN
  Administered 2021-03-13: 4 mg via INTRAVENOUS

## 2021-03-13 MED ORDER — EPHEDRINE SULFATE-NACL 50-0.9 MG/10ML-% IV SOSY
PREFILLED_SYRINGE | INTRAVENOUS | Status: DC | PRN
  Administered 2021-03-13: 10 mg via INTRAVENOUS

## 2021-03-13 MED ORDER — PROPOFOL 10 MG/ML IV BOLUS
INTRAVENOUS | Status: DC | PRN
  Administered 2021-03-13: 150 mg via INTRAVENOUS

## 2021-03-13 MED ORDER — CHLORHEXIDINE GLUCONATE 0.12 % MT SOLN
15.0000 mL | Freq: Once | OROMUCOSAL | Status: AC
  Administered 2021-03-13: 15 mL via OROMUCOSAL

## 2021-03-13 MED ORDER — DIPHENHYDRAMINE HCL 12.5 MG/5ML PO ELIX: 12.5000 mg | ORAL_SOLUTION | Freq: Four times a day (QID) | ORAL | Status: DC | PRN

## 2021-03-13 MED ORDER — HYDROMORPHONE HCL 1 MG/ML IJ SOLN: 0.5000 mg | INTRAMUSCULAR | Status: DC | PRN

## 2021-03-13 MED ORDER — ONDANSETRON HCL 4 MG PO TABS
4.0000 mg | ORAL_TABLET | Freq: Four times a day (QID) | ORAL | Status: DC | PRN
  Filled 2021-03-13: qty 1

## 2021-03-13 MED ORDER — AMISULPRIDE (ANTIEMETIC) 5 MG/2ML IV SOLN
10.0000 mg | Freq: Once | INTRAVENOUS | Status: AC | PRN
  Administered 2021-03-13: 10 mg via INTRAVENOUS

## 2021-03-13 MED ORDER — DIPHENHYDRAMINE HCL 50 MG/ML IJ SOLN
12.5000 mg | Freq: Four times a day (QID) | INTRAMUSCULAR | Status: DC | PRN
  Filled 2021-03-13: qty 1

## 2021-03-13 SURGICAL SUPPLY — 62 items
BAG COUNTER SPONGE SURGICOUNT (BAG) IMPLANT
BLADE HEX COATED 2.75 (ELECTRODE) ×2 IMPLANT
BLADE SURG 15 STRL LF DISP TIS (BLADE) ×1 IMPLANT
BLADE SURG 15 STRL SS (BLADE) ×1
CHLORAPREP W/TINT 26 (MISCELLANEOUS) ×2 IMPLANT
COVER MAYO STAND STRL (DRAPES) ×2 IMPLANT
DERMABOND ADVANCED (GAUZE/BANDAGES/DRESSINGS) ×1
DERMABOND ADVANCED .7 DNX12 (GAUZE/BANDAGES/DRESSINGS) ×1 IMPLANT
DRAPE LAPAROSCOPIC ABDOMINAL (DRAPES) ×2 IMPLANT
DRAPE LAPAROTOMY TRNSV 102X78 (DRAPES) ×2 IMPLANT
DRAPE UTILITY XL STRL (DRAPES) IMPLANT
DRAPE WARM FLUID 44X44 (DRAPES) ×2 IMPLANT
DRSG OPSITE POSTOP 4X10 (GAUZE/BANDAGES/DRESSINGS) IMPLANT
DRSG OPSITE POSTOP 4X6 (GAUZE/BANDAGES/DRESSINGS) IMPLANT
DRSG OPSITE POSTOP 4X8 (GAUZE/BANDAGES/DRESSINGS) IMPLANT
DRSG TELFA 3X8 NADH (GAUZE/BANDAGES/DRESSINGS) IMPLANT
ELECT REM PT RETURN 15FT ADLT (MISCELLANEOUS) ×2 IMPLANT
GAUZE 4X4 16PLY ~~LOC~~+RFID DBL (SPONGE) ×2 IMPLANT
GAUZE SPONGE 4X4 12PLY STRL (GAUZE/BANDAGES/DRESSINGS) ×2 IMPLANT
GLOVE SURG ENC MOIS LTX SZ6.5 (GLOVE) ×4 IMPLANT
GLOVE SURG UNDER LTX SZ6.5 (GLOVE) ×2 IMPLANT
GLOVE SURG UNDER POLY LF SZ7 (GLOVE) ×2 IMPLANT
GOWN STRL REUS W/TWL XL LVL3 (GOWN DISPOSABLE) ×4 IMPLANT
HANDLE SUCTION POOLE (INSTRUMENTS) ×1 IMPLANT
HOLDER FOLEY CATH W/STRAP (MISCELLANEOUS) IMPLANT
KIT BASIN OR (CUSTOM PROCEDURE TRAY) ×2 IMPLANT
KIT TURNOVER KIT A (KITS) IMPLANT
MANIFOLD NEPTUNE II (INSTRUMENTS) ×2 IMPLANT
NDL HYPO 25X1 1.5 SAFETY (NEEDLE) ×1 IMPLANT
NEEDLE HYPO 25X1 1.5 SAFETY (NEEDLE) ×2 IMPLANT
PACK BASIC VI WITH GOWN DISP (CUSTOM PROCEDURE TRAY) ×2 IMPLANT
PACK GENERAL/GYN (CUSTOM PROCEDURE TRAY) ×2 IMPLANT
PAD DRESSING TELFA 3X8 NADH (GAUZE/BANDAGES/DRESSINGS) IMPLANT
PENCIL SMOKE EVACUATOR (MISCELLANEOUS) IMPLANT
RELOAD PROXIMATE 75MM BLUE (ENDOMECHANICALS) IMPLANT
RELOAD STAPLE 75 3.8 BLU REG (ENDOMECHANICALS) IMPLANT
SPIKE FLUID TRANSFER (MISCELLANEOUS) ×2 IMPLANT
SPONGE T-LAP 18X18 ~~LOC~~+RFID (SPONGE) IMPLANT
STAPLER GUN LINEAR PROX 60 (STAPLE) ×1 IMPLANT
STAPLER PROXIMATE 75MM BLUE (STAPLE) ×1 IMPLANT
SUCTION POOLE HANDLE (INSTRUMENTS) ×2
SUT NOVA NAB DX-16 0-1 5-0 T12 (SUTURE) ×1 IMPLANT
SUT NOVA NAB GS-21 0 18 T12 DT (SUTURE) IMPLANT
SUT NOVA NAB GS-21 1 T12 (SUTURE) ×4 IMPLANT
SUT NOVA T20/GS 25 (SUTURE) IMPLANT
SUT PROLENE 2 0 BLUE (SUTURE) IMPLANT
SUT SILK 2 0 (SUTURE) ×1
SUT SILK 2 0 SH CR/8 (SUTURE) ×2 IMPLANT
SUT SILK 2-0 18XBRD TIE 12 (SUTURE) ×1 IMPLANT
SUT SILK 3 0 (SUTURE) ×1
SUT SILK 3 0 SH CR/8 (SUTURE) ×2 IMPLANT
SUT SILK 3-0 18XBRD TIE 12 (SUTURE) ×1 IMPLANT
SUT VIC AB 2-0 SH 18 (SUTURE) IMPLANT
SUT VIC AB 2-0 SH 27 (SUTURE) ×2
SUT VIC AB 2-0 SH 27X BRD (SUTURE) ×2 IMPLANT
SUT VIC AB 3-0 SH 18 (SUTURE) IMPLANT
SUT VIC AB 4-0 PS2 18 (SUTURE) ×2 IMPLANT
SUT VICRYL 4-0 PS2 18IN ABS (SUTURE) ×2 IMPLANT
SYR CONTROL 10ML LL (SYRINGE) ×2 IMPLANT
TOWEL OR 17X26 10 PK STRL BLUE (TOWEL DISPOSABLE) ×4 IMPLANT
TOWEL OR NON WOVEN STRL DISP B (DISPOSABLE) ×4 IMPLANT
YANKAUER SUCT BULB TIP NO VENT (SUCTIONS) ×2 IMPLANT

## 2021-03-13 NOTE — Op Note (Signed)
03/13/2021  9:56 AM  PATIENT:  Cheyenne Scott  66 y.o. female  Patient Care Team: Irwin Brakeman Higinio Roger, [place] as PCP - General (Endocrinology) [name], RN (Inactive) as Oncology Nurse Navigator [name], MD as Consulting Physician (Hematology and Oncology)  PRE-OPERATIVE DIAGNOSIS:  RECTAL CANCER DIVERTING ILEOSTOMY  POST-OPERATIVE DIAGNOSIS:  RECTAL CANCER DIVERTING ILEOSTOMY  PROCEDURE:  LOOP ILEOSTOMY TAKEDOWN REMOVAL PORT-A-CATH   Surgeon(s): Leighton Ruff, MD Dwan Bolt, MD  ASSISTANT: Dr Zenia Resides   ANESTHESIA:   local and general  EBL:40ml  Total I/O In: 1100 [I.V.:1000; IV Piggyback:100] Out: -   DRAINS: none   SPECIMEN:  Source of Specimen:  ileostomy  DISPOSITION OF SPECIMEN:  PATHOLOGY  COUNTS:  YES  PLAN OF CARE: Admit to inpatient   PATIENT DISPOSITION:  PACU - hemodynamically stable.  INDICATION: 66 y.o. F with rectal cancer s/p coloanal anastomosis and diverting ileostomy.  She has completed adjuvant therapy and is ready for port removal and ileostomy reversal.   OR FINDINGS: ileostomy and port in place  DESCRIPTION: the patient was identified in the preoperative holding area and taken to the OR where they were laid supine on the operating room table.  General anesthesia was induced without difficulty. SCDs were also noted to be in place prior to the initiation of anesthesia.  The patient was then prepped and draped in the usual sterile fashion.   A surgical timeout was performed indicating the correct patient, procedure, positioning and need for preoperative antibiotics.   I began by making an incision around the ileostomy mucocutaneous junction using electrocautery.  Dissection was carried down through the subcutaneous tissues to the level of the fascia.  The ostomy was carefully separated from the fascia using Metzenbaum scissors.  Hemostasis was achieved as we went with electrocautery.  Once the entire ileostomy was freed from  the wound, an enterotomy was made in the proximal and distal limbs using electrocautery.  A 75 mm GIA stapler was inserted into the enterotomies and an anastomosis was created.  The common enterotomy was then closed using a blue load 60 mm TA stapler.  The mesentery was closed using interrupted 2-0 silk sutures.  Hemostasis was good.  The edges of the staple line were imbricated using 3-0 silk sutures.  In and tied tension suture was placed in the crotch of the anastomosis.  This was then placed back into the abdomen and the omentum was placed over it.  The fascia was then closed using interrupted #1 Novafil sutures.  The subcutaneous tissue was reapproximated with a 2-0 Vicryl pursestring suture.  The dermal layer was also partially closed using a 2-0 Vicryl pursestring suture.  A Telfa sponge was placed in the middle and a sterile dressing was applied.  We then switched to clean instruments and drapes and gloves.  An incision was made at the previous port site using a 15 blade scalpel.  Dissection was carried down through the subcutaneous tissue using electrocautery.  The capsule was entered and the port was removed.  The stay sutures were also removed.  A 3-0 Vicryl pursestring was placed over the internal opening and this was tied down to prevent backbleeding.  The capsule was then closed using a running 3-0 Vicryl suture.  The dermis was then closed using a running 4-0 Vicryl suture and Dermabond was placed over the skin.  The patient was then awakened from anesthesia and sent to the postanesthesia care unit in stable condition.  All counts were  correct per operating room staff.

## 2021-03-13 NOTE — Transfer of Care (Signed)
Immediate Anesthesia Transfer of Care Note  Patient: Cheyenne Scott  Procedure(s) Performed: Procedure(s): ILEOSTOMY TAKEDOWN (N/A) REMOVAL PORT-A-CATH (N/A)  Patient Location: PACU  Anesthesia Type:General  Level of Consciousness: Alert, Awake, Oriented  Airway & Oxygen Therapy: Patient Spontanous Breathing  Post-op Assessment: Report given to RN  Post vital signs: Reviewed and stable  Last Vitals:  Vitals:   03/13/21 0619  BP: 136/78  Pulse: 82  Resp: 18  Temp: 36.7 C  SpO2: 26%    Complications: No apparent anesthesia complications

## 2021-03-13 NOTE — Plan of Care (Signed)
°  Problem: Education: Goal: Knowledge of General Education information will improve Description: Including pain rating scale, medication(s)/side effects and non-pharmacologic comfort measures Outcome: Progressing   Problem: Health Behavior/Discharge Planning: Goal: Ability to manage health-related needs will improve Outcome: Progressing   Problem: Clinical Measurements: Goal: Ability to maintain clinical measurements within normal limits will improve Outcome: Progressing   Problem: Activity: Goal: Risk for activity intolerance will decrease Outcome: Progressing   Problem: Nutrition: Goal: Adequate nutrition will be maintained Outcome: Progressing   Problem: Clinical Measurements: Goal: Postoperative complications will be avoided or minimized Outcome: Progressing

## 2021-03-13 NOTE — Discharge Instructions (Signed)
ABDOMINAL SURGERY: POST OP INSTRUCTIONS  DIET: Follow a light bland diet the first 24 hours after arrival home, such as soup, liquids, crackers, etc.  Be sure to include lots of fluids daily.  Avoid fast food or heavy meals as your are more likely to get nauseated.  Do not eat any uncooked fruits or vegetables for the next 2 weeks as your colon heals. Take your usually prescribed home medications unless otherwise directed. PAIN CONTROL: Pain is best controlled by a usual combination of three different methods TOGETHER: Ice/Heat Over the counter pain medication Prescription pain medication Most patients will experience some swelling and bruising around the incisions.  Ice packs or heating pads (30-60 minutes up to 6 times a day) will help. Use ice for the first few days to help decrease swelling and bruising, then switch to heat to help relax tight/sore spots and speed recovery.  Some people prefer to use ice alone, heat alone, alternating between ice & heat.  Experiment to what works for you.  Swelling and bruising can take several weeks to resolve.   It is helpful to take an over-the-counter pain medication regularly for the first few weeks.  Choose one of the following that works best for you: Naproxen (Aleve, etc)  Two 220mg  tabs twice a day Ibuprofen (Advil, etc) Three 200mg  tabs four times a day (every meal & bedtime) Acetaminophen (Tylenol, etc) 500-650mg  four times a day (every meal & bedtime) A  prescription for pain medication (such as oxycodone, hydrocodone, etc) should be given to you upon discharge.  Take your pain medication as prescribed.  If you are having problems/concerns with the prescription medicine (does not control pain, nausea, vomiting, rash, itching, etc), please call us [phone] to see if we need to switch you to a different pain medicine that will work better for you and/or control your side effect better. If you need a refill on your pain medication, please contact  your pharmacy.  They will contact our office to request authorization. Prescriptions will not be filled after 5 pm or on week-ends. Avoid getting constipated.  Between the surgery and the pain medications, it is common to experience some constipation.  Increasing fluid intake and taking a fiber supplement (such as Metamucil, Citrucel, FiberCon, MiraLax, etc) 1-2 times a day regularly will usually help prevent this problem from occurring.  A mild laxative (prune juice, Milk of Magnesia, MiraLax, etc) should be taken according to package directions if there are no bowel movements after 48 hours.   Watch out for diarrhea.  If you have many loose bowel movements, simplify your diet to bland foods & liquids for a few days.  Stop any stool softeners and decrease your fiber supplement.  Switching to mild anti-diarrheal medications (Kayopectate, Pepto Bismol) can help.  If this worsens or does not improve, please call us. Wash / shower every day.  You may shower over the incision / wound.  Avoid baths until the skin is fully healed.  Continue to shower over incision(s) after the dressing is off. Remove your waterproof bandages 5 days after surgery.  You may leave the incision open to air.  You may replace a dressing/Band-Aid to cover the incision for comfort if you wish. ACTIVITIES as tolerated:   You may resume regular (light) daily activities beginning the next day--such as daily self-care, walking, climbing stairs--gradually increasing activities as tolerated.  If you can walk 30 minutes without difficulty, it is safe to try more intense activity such as jogging,  treadmill, bicycling, low-impact aerobics, swimming, etc. Save the most intensive and strenuous activity for last such as sit-ups, heavy lifting, contact sports, etc  Refrain from any heavy lifting or straining until you are off narcotics for pain control.   DO NOT PUSH THROUGH PAIN.  Let pain be your guide: If it hurts to do something, don't do it.   Pain is your body warning you to avoid that activity for another week until the pain goes down. You may drive when you are no longer taking prescription pain medication, you can comfortably wear a seatbelt, and you can safely maneuver your car and apply brakes. You may have sexual intercourse when it is comfortable.  FOLLOW UP in our office Please call [facility] at [phone] to set up an appointment to see your surgeon in the office for a follow-up appointment approximately 1-2 weeks after your surgery. Make sure that you call for this appointment the day you arrive home to insure a convenient appointment time. 10. IF YOU HAVE DISABILITY OR FAMILY LEAVE FORMS, BRING THEM TO THE OFFICE FOR PROCESSING.  DO NOT GIVE THEM TO YOUR DOCTOR.   WHEN TO CALL us [phone]: Poor pain control Reactions / problems with new medications (rash/itching, nausea, etc)  Fever over 101.5 F (38.5 C) Inability to urinate Nausea and/or vomiting Worsening swelling or bruising Continued bleeding from incision. Increased pain, redness, or drainage from the incision  The clinic staff is available to answer your questions during regular business hours (8:30am-5pm).  Please dont hesitate to call and ask to speak to one of our nurses for clinical concerns.   A surgeon from [facility] is always on call at the hospitals   If you have a medical emergency, go to the nearest emergency room or call 911.    [facility], [place], [place], [place], [place]  [address] ? MAIN: [phone] ? TOLL FREE: [phone] ? FAX [phone] [email or web address]    Diet Since many people have frequent bowel movements right after the operation, following a diet can help reduce the number of stools. Incorporate new foods into your diet one at a time to see how they affect your stool output. You may find that some foods give you trouble initially, but that you can tolerate  them better later.  Do not skip meals. This tends to make the stools more irritating and loose.  Balancing starches with foods that tend to give diarrhea is also helpful in controlling the frequency of bowel movements.  Once your colon has been removed, you will need more salt until your body adjusts to not having a colon. Pretzels and corn chips are good snacks.  Hot and spicy foods will probably burn on the way out and should be avoided if your anal area feels irritated.  Seeds and nuts also can be irritating.  Within three to nine months after surgery, your body will have started to adjust to having the surgery. At this point, you should try to eat all types of foods and see how they affect you. Some patients have tried a low glycemic index diet, which includes foods that are high in fiber and cause a slow rise in blood sugar, to help control their bowel movements. Resources on the Internet and at your [employer] are available for more information on this type of diet.  Fluids are important to prevent dehydration. Drink enough fluid so your urine is light yellow in color. Avoid  fruit juices, carbonated beverages, drinks with caffeine and straws (swallowed air increases gas). Rather than drinking fluids with your meal, try drinking fluids at the end of your meal, which may help slow down your stool.  Many people ask about vitamin supplements after their operation when they are limiting fruits and vegetables. It is fine to take vitamins, but they should be chewable or in liquid form, otherwise they may not be fully absorbed.  Stool Frequency According to a survey of [facility] patients who have had a coloanal surgery, about half of the patients have between five to eight stools a day. About 30 percent have nine to 12 stools a day. Older patients -- those over 91 -- have more stools than younger patients. Less than 8 percent of patients have less than four stools a day. About 9 percent have over 13 stools a  day. As the reservoir adapts and stretches to its normal capacity, the number of stools you have per day should decrease. You will probably have many stools while you are in the hospital, but these should lessen by the time you go home. Often the first stools are without your control while in the hospital, though this is resolved before you go home. The biggest increase in stools usually occurs after you begin eating. You can expect your number of bowel movements to decrease gradually. The stools will range from watery to a paste-like consistency. By watching your diet, you can avoid foods that tend to produce loose stools. The more frequent your bowel movements, the more itching and burning you will have around your anus. A combined approach of diet and medications, such as Imodium and Lomotil, may help decrease your number of bowel movements. Most people need to take these medications more frequently right after having their ileoanal reservoir procedure, but only use them occasionally as their reservoir function improves. For some people, adding a fiber supplement, such as Metamucil, Benefiber, Konsyl, Citracel or a generic equivalent, and taking them with half the recommended amount of water, can help thicken their stool and reduce the number of bowel movements. Nighttime Stool Frequency Stool frequency at night is a common problem and can be very disruptive of a good night's sleep. More than half of [facility] patients surveyed -- about 63 percent -- get up once or twice per night to pass stool, and about 24 percent of patients awaken three times or more during the night to have a bowel movement. This can be related to eating late, overeating or eating foods known to cause problems. Tips for decreasing the number of stools at night include: Don't eat late. Wait at least three to four hours after your last meal before going to bed.  Take an anti-diarrheal medication before going to bed.  Eat binding foods at  dinner and avoid those foods that tend to cause diarrhea.  Don't overeat at dinner. Diarrhea Diarrhea occurs when your stool is very watery and more frequent than usual. Diarrhea can be caused by eating certain types of foods, eating too much of any food, pouchitis and other illnesses like the flu. If you develop the flu early in your recovery and have diarrhea, you may become dehydrated and need to be hospitalized. Sometimes people have diarrhea because they don't have the enzyme called lactase needed to digest the sugar in milk products. If you feel bloated and have cramps and gas after drinking milk, try not drinking it and see if symptoms go away. You can also try fermented  milk products, such as yogurt, hard cheese and buttermilk, as well as soy or goat's milk. Adding fiber supplements, such as Metamucil, Benefiber, Konsyl, Citracel or the generic equivalent, can help thicken your stool and reduce diarrhea. Gatorade or other sport drinks can also be helpful in treating diarrhea by keeping you hydrated. Skin Care Until your body adjusts to your new internal pouch, you may experience some leakage of stool, especially at night. Liquid stool is very irritating to the skin around the anus, so it is important to keep this area clean and dry. Use soft, white, non-perfumed toilet tissue to blot gently after each bowel movement, drying the skin completely. Do not scratch, rub or wipe the skin. It may help to spray water from a squirt bottle. Some people find drying the skin with a blow dryer helps. For irritated skin around the anus, warm baths are very soothing. Do not apply anything to the skin that burns or irritates. Moist cotton balls may be better for wiping if your skin is very sore. Some people use baby wipes to cleanse themselves. Use a brand without alcohol or scents. Some people tuck a small piece of toilet tissue or a dry cotton ball near the anal opening to protect their skin from small amounts of  leakage. Scented soaps and tissues should not be used since these may cause irritation. A protective ointment may be used after each bowel movement to keep the stool off the skin. You may want to wear a protective pad to keep your clothing clean. Anal itching and burning are often not visible on the outside because the irritated area is actually on the inside. You can look with a mirror to see if your irritation is on the outside skin. Some of the itching and burning is a normal part of the internal healing and will go away in time. Some people have found Pepto Bismol taken by mouth is helpful for the burning. Sometimes people develop a yeast infection around the anal area. It happens more commonly when people are taking antibiotics, or in women around the time of their period. If you get an itchy red rash, you may need an anti-fungal cream or ointment. This is available over the counter. Occasionally people are allergic to the preparations they are using around their anus. This is not very common. Some products contain lanolin -- if you are allergic to this, check the ingredients of ointments before using. Incontinence Mild incontinence or leakage of stool from your J-pouch is a common problem that improves with time as your stool thickens, your pouch stretches and your sphincters become stronger. The incontinence is usually worse at night when your sphincters relax. The looser the stool, the more likely it is to leak. Some people accidentally pass stool when they are passing gas. In time, most people develop an awareness of whether they are passing stool or gas, but initially this can be a problem. It is also important to note that if you develop incontinence later on, you may have pouchitis. According to our survey, 60 percent of people occasionally pass stool without control, usually at night while in a deep sleep. About 8 percent occasionally pass some liquid stool during the day. About 35 percent notice  staining of stool on their underwear at least once a month during the day or the night. Often this can be related to eating something that causes a problem, having pouchitis, overeating, going to bed soon after eating, or just being in a  very deep sleep. Medications, diet and careful skin care are all important during this period of adaptation. A small pad helps absorb leakage. Some people find sleeping on a small towel or pad also is helpful. Before your operation, it is a good idea to buy more cotton underwear.

## 2021-03-13 NOTE — Anesthesia Postprocedure Evaluation (Signed)
Anesthesia Post Note  Patient: Cheyenne Scott  Procedure(s) Performed: ILEOSTOMY TAKEDOWN REMOVAL PORT-A-CATH     Patient location during evaluation: PACU Anesthesia Type: General Level of consciousness: awake and alert Pain management: pain level controlled Vital Signs Assessment: post-procedure vital signs reviewed and stable Respiratory status: spontaneous breathing, nonlabored ventilation and respiratory function stable Cardiovascular status: blood pressure returned to baseline and stable Postop Assessment: no apparent nausea or vomiting Anesthetic complications: no   No notable events documented.  Last Vitals:  Vitals:   03/13/21 1130 03/13/21 1145  BP: 134/61 124/60  Pulse: 78 77  Resp: 12 12  Temp: (!) 36.2 C   SpO2: 100% 100%    Last Pain:  Vitals:   03/13/21 1145  TempSrc:   PainSc: 0-No pain                 Lidia Collum

## 2021-03-13 NOTE — Anesthesia Procedure Notes (Signed)
Procedure Name: Intubation Date/Time: 03/13/2021 8:36 AM Performed by: Gerald Leitz, CRNA Pre-anesthesia Checklist: Patient identified, Patient being monitored, Timeout performed, Emergency Drugs available and Suction available Patient Re-evaluated:Patient Re-evaluated prior to induction Oxygen Delivery Method: Circle system utilized Preoxygenation: Pre-oxygenation with 100% oxygen Induction Type: IV induction Ventilation: Mask ventilation without difficulty Laryngoscope Size: Mac and 3 Grade View: Grade I Tube type: Oral Tube size: 7.0 mm Number of attempts: 1 Airway Equipment and Method: Stylet Placement Confirmation: ETT inserted through vocal cords under direct vision, positive ETCO2 and breath sounds checked- equal and bilateral Secured at: 21 cm Tube secured with: Tape Dental Injury: Teeth and Oropharynx as per pre-operative assessment

## 2021-03-13 NOTE — Interval H&P Note (Signed)
History and Physical Interval Note:  03/13/2021 7:50 AM  Cheyenne Scott  has presented today for surgery, with the diagnosis of RECTAL CANCER DIVERTING ILEOSTOMY.  The various methods of treatment have been discussed with the patient and family. After consideration of risks, benefits and other options for treatment, the patient has consented to  Procedure(s): ILEOSTOMY TAKEDOWN (N/A) REMOVAL PORT-A-CATH (N/A) as a surgical intervention.  The patient's history has been reviewed, patient examined, no change in status, stable for surgery.  I have reviewed the patient's chart and labs.  Questions were answered to the patient's satisfaction.     Rosario Adie, MD  [facility]

## 2021-03-14 ENCOUNTER — Encounter (HOSPITAL_COMMUNITY): Payer: Self-pay | Admitting: General Surgery

## 2021-03-14 LAB — BASIC METABOLIC PANEL
Anion gap: 8 (ref 5–15)
BUN: 11 mg/dL (ref 8–23)
CO2: 24 mmol/L (ref 22–32)
Calcium: 8.8 mg/dL — ABNORMAL LOW (ref 8.9–10.3)
Chloride: 101 mmol/L (ref 98–111)
Creatinine, Ser: 0.82 mg/dL (ref 0.44–1.00)
GFR, Estimated: >60 mL/min (ref >60)
Glucose, Bld: 141 mg/dL — ABNORMAL HIGH (ref 70–99)
Potassium: 3.7 mmol/L (ref 3.5–5.1)
Sodium: 133 mmol/L — ABNORMAL LOW (ref 135–145)

## 2021-03-14 LAB — CBC
HCT: 32.2 % — ABNORMAL LOW (ref 36.0–46.0)
Hemoglobin: 10.6 g/dL — ABNORMAL LOW (ref 12.0–15.0)
MCH: 29.9 pg (ref 26.0–34.0)
MCHC: 32.9 g/dL (ref 30.0–36.0)
MCV: 91 fL (ref 80.0–100.0)
Platelets: 198 10*3/uL (ref 150–400)
RBC: 3.54 MIL/uL — ABNORMAL LOW (ref 3.87–5.11)
RDW: 13.6 % (ref 11.5–15.5)
WBC: 13.4 10*3/uL — ABNORMAL HIGH (ref 4.0–10.5)
nRBC: 0 % (ref 0.0–0.2)

## 2021-03-14 MED ORDER — SODIUM CHLORIDE 0.9 % IV SOLN: INTRAVENOUS | Status: DC

## 2021-03-14 NOTE — Progress Notes (Signed)
rm 1320, Cheyenne Scott 78N, Had  ileostomy takedown 03/13/21. But today has incr WBC 13.4 and Feels bad thru belly. N/V/D and flushed.

## 2021-03-14 NOTE — Progress Notes (Signed)
1 Day Post-Op   Subjective/Chief Complaint: Patient is comfortable Not using any narcotic pain medication Passing flatus - no BM yet Voiding well Ambulating in hallways   Objective: Vital signs in last 24 hours: Temp:  [97.2 F (36.2 C)-98.3 F (36.8 C)] 97.6 F (36.4 C) (02/04 1009) Pulse Rate:  [65-93] 81 (02/04 1009) Resp:  [12-18] 17 (02/04 1009) BP: (93-155)/(50-71) 102/54 (02/04 1009) SpO2:  [96 %-100 %] 100 % (02/04 1009)    Intake/Output from previous day: 02/03 0701 - 02/04 0700 In: 4656.4 [P.O.:2330; I.V.:2226.4; IV Piggyback:100] Out: 2720 [Urine:2700; Blood:20] Intake/Output this shift: Total I/O In: 360 [P.O.:360] Out: -   General appearance: alert, cooperative, and no distress Chest wall: right chest port site - mild swelling; no hematoma or infection GI: Ileostomy site - some drainage; mildly tender Abd - soft, appropriately tender  Lab Results:  Recent Labs    03/14/21 0545  WBC 13.4*  HGB 10.6*  HCT 32.2*  PLT 198   BMET Recent Labs    03/14/21 0545  NA 133*  K 3.7  CL 101  CO2 24  GLUCOSE 141*  BUN 11  CREATININE 0.82  CALCIUM 8.8*   PT/INR No results for input(s): LABPROT, INR in the last 72 hours. ABG No results for input(s): PHART, HCO3 in the last 72 hours.  Invalid input(s): PCO2, PO2  Studies/Results: No results found.  Anti-infectives: Anti-infectives (From admission, onward)    Start     Dose/Rate Route Frequency Ordered Stop   03/13/21 0630  cefoTEtan (CEFOTAN) 2 g in sodium chloride 0.9 % 100 mL IVPB        2 g 200 mL/hr over 30 Minutes Intravenous On call to O.R. 03/13/21 [id number] 03/13/21 0858       Assessment/Plan: s/p Procedure(s): ILEOSTOMY TAKEDOWN (N/A) REMOVAL PORT-A-CATH (N/A) Advance to soft diet Encourage ambulation Probable discharge tomorrow.    LOS: 1 day    Cheyenne Scott 03/14/2021

## 2021-03-14 NOTE — TOC Progression Note (Signed)
Transition of Care [facility]) - Progression Note    Patient Details  Name: Cheyenne Scott MRN: [id number] Date of Birth: 06/05/1955  Transition [facility]) CM/SW Contact  Purcell Mouton, RN Phone Number: 03/14/2021, 11:39 AM  Clinical Narrative:    Chart reviewed. TOC will continue to follow  for discharge needs.    Expected Discharge Plan: Home/Self Care Barriers to Discharge: No Barriers Identified  Expected Discharge Plan and Services Expected Discharge Plan: Home/Self Care       Living arrangements for the past 2 months: Single Family Home                                       Social Determinants of Health (SDOH) Interventions    Readmission Risk Interventions No flowsheet data found.

## 2021-03-15 ENCOUNTER — Inpatient Hospital Stay (HOSPITAL_COMMUNITY): Payer: Medicare Other

## 2021-03-15 LAB — CBC
HCT: 35.5 % — ABNORMAL LOW (ref 36.0–46.0)
Hemoglobin: 11.5 g/dL — ABNORMAL LOW (ref 12.0–15.0)
MCH: 29.8 pg (ref 26.0–34.0)
MCHC: 32.4 g/dL (ref 30.0–36.0)
MCV: 92 fL (ref 80.0–100.0)
Platelets: 206 10*3/uL (ref 150–400)
RBC: 3.86 MIL/uL — ABNORMAL LOW (ref 3.87–5.11)
RDW: 14 % (ref 11.5–15.5)
WBC: 9.6 10*3/uL (ref 4.0–10.5)
nRBC: 0 % (ref 0.0–0.2)

## 2021-03-15 LAB — BASIC METABOLIC PANEL
Anion gap: 8 (ref 5–15)
BUN: 17 mg/dL (ref 8–23)
CO2: 26 mmol/L (ref 22–32)
Calcium: 8.8 mg/dL — ABNORMAL LOW (ref 8.9–10.3)
Chloride: 103 mmol/L (ref 98–111)
Creatinine, Ser: 0.89 mg/dL (ref 0.44–1.00)
GFR, Estimated: >60 mL/min (ref >60)
Glucose, Bld: 162 mg/dL — ABNORMAL HIGH (ref 70–99)
Potassium: 3.5 mmol/L (ref 3.5–5.1)
Sodium: 137 mmol/L (ref 135–145)

## 2021-03-15 MED ORDER — LACTATED RINGERS IV BOLUS: 1000.0000 mL | Freq: Three times a day (TID) | INTRAVENOUS | Status: AC | PRN

## 2021-03-15 MED ORDER — PROCHLORPERAZINE EDISYLATE 10 MG/2ML IJ SOLN
10.0000 mg | INTRAMUSCULAR | Status: DC | PRN
  Administered 2021-03-15 (×2): 10 mg via INTRAVENOUS
  Filled 2021-03-15 (×2): qty 2

## 2021-03-15 MED ORDER — ONDANSETRON HCL 4 MG/2ML IJ SOLN
4.0000 mg | Freq: Four times a day (QID) | INTRAMUSCULAR | Status: DC | PRN
  Administered 2021-03-15 – 2021-03-17 (×3): 4 mg via INTRAVENOUS
  Filled 2021-03-15 (×3): qty 2

## 2021-03-15 MED ORDER — PANTOPRAZOLE SODIUM 40 MG IV SOLR
40.0000 mg | INTRAVENOUS | Status: DC
  Administered 2021-03-15 – 2021-03-17 (×3): 40 mg via INTRAVENOUS
  Filled 2021-03-15 (×2): qty 40
  Filled 2021-03-15: qty 10

## 2021-03-15 MED ORDER — STERILE WATER FOR INJECTION IJ SOLN
INTRAMUSCULAR | Status: AC
  Administered 2021-03-15: 10 mL
  Filled 2021-03-15: qty 10

## 2021-03-15 MED ORDER — ONDANSETRON HCL 4 MG PO TABS: 4.0000 mg | ORAL_TABLET | ORAL | Status: DC | PRN

## 2021-03-15 MED ORDER — METOCLOPRAMIDE HCL 5 MG/ML IJ SOLN
10.0000 mg | Freq: Four times a day (QID) | INTRAMUSCULAR | Status: DC
  Administered 2021-03-15 – 2021-03-17 (×8): 10 mg via INTRAVENOUS
  Filled 2021-03-15 (×8): qty 2

## 2021-03-15 NOTE — Progress Notes (Signed)
rm 1320, ileostomy takedown on 2.3.23. Vomiting every hr. Zofran not helpful, Can she have Compazine. Lot of wrenching. feels bad.

## 2021-03-15 NOTE — Progress Notes (Signed)
Pt resting in bed with family at bedside. She reports feeling slightly better with her nausea and pain. Pt remains stable. Rn will continue to monitor.

## 2021-03-15 NOTE — Progress Notes (Signed)
2 Days Post-Op   Subjective/Chief Complaint: Patient had several episodes of diarrhea yesterday Late in the day, she became very nauseated.  Minimal vomiting Abd soreness only when vomiting - some lower abdominal cramping Does not feel distended Zofran - minimally effective   Objective: Vital signs in last 24 hours: Temp:  [97.5 F (36.4 C)-99.1 F (37.3 C)] 99.1 F (37.3 C) (02/05 0530) Pulse Rate:  [71-105] 80 (02/05 0530) Resp:  [16-18] 16 (02/05 0530) BP: (102-147)/(54-79) 147/66 (02/05 0530) SpO2:  [96 %-100 %] 96 % (02/05 0530)    Intake/Output from previous day: 02/04 0701 - 02/05 0700 In: 1537.9 [P.O.:1440; I.V.:97.9] Out: 70 [Emesis/NG output:70] Intake/Output this shift: Total I/O In: 774 [I.V.:774] Out: -   General appearance: alert, cooperative, and no distress Chest wall: right chest port site - mild swelling; no hematoma or infection GI: Ileostomy site - wick in place; minimal drainage Abd - soft, appropriately tender  Lab Results:  Recent Labs    03/14/21 0545 03/15/21 0454  WBC 13.4* 9.6  HGB 10.6* 11.5*  HCT 32.2* 35.5*  PLT 198 206   BMET Recent Labs    03/14/21 0545 03/15/21 0454  NA 133* 137  K 3.7 3.5  CL 101 103  CO2 24 26  GLUCOSE 141* 162*  BUN 11 17  CREATININE 0.82 0.89  CALCIUM 8.8* 8.8*   PT/INR No results for input(s): LABPROT, INR in the last 72 hours. ABG No results for input(s): PHART, HCO3 in the last 72 hours.  Invalid input(s): PCO2, PO2  Studies/Results: No results found.  Anti-infectives: Anti-infectives (From admission, onward)    Start     Dose/Rate Route Frequency Ordered Stop   03/13/21 0630  cefoTEtan (CEFOTAN) 2 g in sodium chloride 0.9 % 100 mL IVPB        2 g 200 mL/hr over 30 Minutes Intravenous On call to O.R. 03/13/21 [id number] 03/13/21 0858       Assessment/Plan: s/p Procedure(s): ILEOSTOMY TAKEDOWN (N/A) REMOVAL PORT-A-CATH (N/A) Post-operative ileus Add Reglan 10 mg IV q6  scheduled Plain films of abdomen IV fluids Sips and chips  LOS: 2 days    Maia Petties 03/15/2021

## 2021-03-15 NOTE — Plan of Care (Signed)
°  Problem: Clinical Measurements: Goal: Diagnostic test results will improve Outcome: Progressing   Problem: Clinical Measurements: Goal: Respiratory complications will improve Outcome: Progressing   Problem: Nutrition: Goal: Adequate nutrition will be maintained Outcome: Progressing   Problem: Pain Managment: Goal: General experience of comfort will improve Outcome: Progressing

## 2021-03-16 LAB — BASIC METABOLIC PANEL
Anion gap: 8 (ref 5–15)
BUN: 18 mg/dL (ref 8–23)
CO2: 24 mmol/L (ref 22–32)
Calcium: 8.5 mg/dL — ABNORMAL LOW (ref 8.9–10.3)
Chloride: 109 mmol/L (ref 98–111)
Creatinine, Ser: 0.74 mg/dL (ref 0.44–1.00)
GFR, Estimated: >60 mL/min (ref >60)
Glucose, Bld: 122 mg/dL — ABNORMAL HIGH (ref 70–99)
Potassium: 3.3 mmol/L — ABNORMAL LOW (ref 3.5–5.1)
Sodium: 141 mmol/L (ref 135–145)

## 2021-03-16 LAB — CBC
HCT: 34 % — ABNORMAL LOW (ref 36.0–46.0)
Hemoglobin: 11.1 g/dL — ABNORMAL LOW (ref 12.0–15.0)
MCH: 30.4 pg (ref 26.0–34.0)
MCHC: 32.6 g/dL (ref 30.0–36.0)
MCV: 93.2 fL (ref 80.0–100.0)
Platelets: 220 10*3/uL (ref 150–400)
RBC: 3.65 MIL/uL — ABNORMAL LOW (ref 3.87–5.11)
RDW: 14 % (ref 11.5–15.5)
WBC: 8.3 10*3/uL (ref 4.0–10.5)
nRBC: 0 % (ref 0.0–0.2)

## 2021-03-16 LAB — SURGICAL PATHOLOGY

## 2021-03-16 NOTE — Care Management Important Message (Signed)
Important Message  Patient Details IM Letter given to the Patient. Name: Cheyenne Scott MRN: [id number] Date of Birth: 1955-10-20   Medicare Important Message Given:  Yes     Kerin Salen 03/16/2021, 3:30 PM

## 2021-03-16 NOTE — Progress Notes (Addendum)
Patient has had less vomiting over night but  now has had at least 6 watery stools soaking up 2 large sanitary pads.

## 2021-03-16 NOTE — Progress Notes (Signed)
3 Days Post-Op ileostomy reversal Subjective: Feeling better, less nausea, no vomiting today.  Having bowel function, passing some blood  Objective: Vital signs in last 24 hours: Temp:  [98.1 F (36.7 C)-99.8 F (37.7 C)] 98.5 F (36.9 C) (02/06 1440) Pulse Rate:  [68-87] 81 (02/06 1440) Resp:  [14-18] 18 (02/06 1440) BP: (131-142)/(57-64) 131/57 (02/06 1440) SpO2:  [95 %-100 %] 99 % (02/06 1440) Weight:  [71.9 kg] 71.9 kg (02/06 0500)   Intake/Output from previous day: 02/05 0701 - 02/06 0700 In: 2725.9 [P.O.:30; I.V.:2695.9] Out: 400 [Urine:400] Intake/Output this shift: No intake/output data recorded.   General appearance: alert and cooperative GI: soft, non-distended  Incision: no significant drainage  Lab Results:  Recent Labs    03/15/21 0454 03/16/21 0412  WBC 9.6 8.3  HGB 11.5* 11.1*  HCT 35.5* 34.0*  PLT 206 220   BMET Recent Labs    03/15/21 0454 03/16/21 0412  NA 137 141  K 3.5 3.3*  CL 103 109  CO2 26 24  GLUCOSE 162* 122*  BUN 17 18  CREATININE 0.89 0.74  CALCIUM 8.8* 8.5*   PT/INR No results for input(s): LABPROT, INR in the last 72 hours. ABG No results for input(s): PHART, HCO3 in the last 72 hours.  Invalid input(s): PCO2, PO2  MEDS, Scheduled  enoxaparin (LOVENOX) injection  40 mg Subcutaneous Q24H   feeding supplement  237 mL Oral BID BM   gabapentin  300 mg Oral BID   metoCLOPramide (REGLAN) injection  10 mg Intravenous Q6H   pantoprazole (PROTONIX) IV  40 mg Intravenous Q24H   saccharomyces boulardii  250 mg Oral BID    Studies/Results: DG Abd Portable 2V  Result Date: 03/15/2021 CLINICAL DATA:  Nausea, vomiting, abdominal pain EXAM: PORTABLE ABDOMEN - 2 VIEW COMPARISON:  None. FINDINGS: There is mild dilation of small-bowel loops measuring up to 3.3 cm in diameter. Surgical staples are seen in the right lower quadrant of abdomen. Stomach is not distended. Gas is present in colon. No abnormal masses or calcifications are  seen. IMPRESSION: There is mild dilation of small-bowel loops suggesting ileus. Electronically Signed   By: Elmer Picker M.D.   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

## 2021-03-16 NOTE — Progress Notes (Signed)
Transition of Care [facility]) Screening Note  Patient Details  Name: Cheyenne Scott Date of Birth: 31-Jul-1955  Transition of Care [facility]) CM/SW Contact:    Sherie Don, LCSW Phone Number: 03/16/2021, 11:59 AM  Transition of Care Department [facility]) has reviewed patient and no TOC needs have been identified at this time. We will continue to monitor patient advancement through interdisciplinary progression rounds. If new patient transition needs arise, please place a TOC consult.

## 2021-03-17 MED ORDER — PANTOPRAZOLE SODIUM 40 MG PO TBEC
40.0000 mg | DELAYED_RELEASE_TABLET | Freq: Every day | ORAL | Status: DC
  Administered 2021-03-18: 40 mg via ORAL
  Filled 2021-03-17: qty 1

## 2021-03-17 MED ORDER — METOCLOPRAMIDE HCL 5 MG/ML IJ SOLN: 10.0000 mg | Freq: Four times a day (QID) | INTRAMUSCULAR | Status: DC | PRN

## 2021-03-17 MED ORDER — KCL IN DEXTROSE-NACL 20-5-0.45 MEQ/L-%-% IV SOLN
INTRAVENOUS | Status: DC
  Filled 2021-03-17 (×2): qty 1000

## 2021-03-17 NOTE — Progress Notes (Signed)
I asked nurse on the floor if there is any patient who might need a chaplain visit and this room (1320) was mentioned. When I entered the patient was just waking up and patient maybe later for a chaplain visit.

## 2021-03-17 NOTE — Progress Notes (Signed)
PHARMACIST - PHYSICIAN COMMUNICATION  DR:   Marcello Moores  CONCERNING: IV to Oral Route Change Policy  RECOMMENDATION: This patient is receiving Protonix and Benadryl by the intravenous route.  Based on criteria approved by the Pharmacy and Therapeutics Committee, the intravenous medication(s) is/are being converted to the equivalent oral dose form(s).   DESCRIPTION: These criteria include: The patient is eating (either orally or via tube) and/or has been taking other orally administered medications for a least 24 hours The patient has no evidence of active gastrointestinal bleeding or impaired GI absorption (gastrectomy, short bowel, patient on TNA or NPO).  If you have questions about this conversion, please contact the Pharmacy Department  []   [phone] )  [facility] []   [phone] )  [facility] []   [phone] )  [facility] []   [phone] )  [facility] [x]   [phone] )  [place], PharmD, BCPS 03/17/2021 9:06 AM

## 2021-03-17 NOTE — Progress Notes (Signed)
4 Days Post-Op ileostomy reversal Subjective: Feeling better, less nausea.  Having bowel function, tolerated fulls last night  Objective: Vital signs in last 24 hours: Temp:  [98.1 F (36.7 C)-98.5 F (36.9 C)] 98.1 F (36.7 C) (02/07 0639) Pulse Rate:  [73-81] 73 (02/07 0639) Resp:  [14-18] 16 (02/07 0639) BP: (124-133)/(57-65) 133/65 (02/07 0639) SpO2:  [96 %-100 %] 97 % (02/07 0639)   Intake/Output from previous day: 02/06 0701 - 02/07 0700 In: 2150.5 [I.V.:2150.5] Out: -  Intake/Output this shift: No intake/output data recorded.   General appearance: alert and cooperative GI: soft, non-distended  Incision: no significant drainage  Lab Results:  Recent Labs    03/15/21 0454 03/16/21 0412  WBC 9.6 8.3  HGB 11.5* 11.1*  HCT 35.5* 34.0*  PLT 206 220    BMET Recent Labs    03/15/21 0454 03/16/21 0412  NA 137 141  K 3.5 3.3*  CL 103 109  CO2 26 24  GLUCOSE 162* 122*  BUN 17 18  CREATININE 0.89 0.74  CALCIUM 8.8* 8.5*    PT/INR No results for input(s): LABPROT, INR in the last 72 hours. ABG No results for input(s): PHART, HCO3 in the last 72 hours.  Invalid input(s): PCO2, PO2  MEDS, Scheduled  enoxaparin (LOVENOX) injection  40 mg Subcutaneous Q24H   feeding supplement  237 mL Oral BID BM   gabapentin  300 mg Oral BID   metoCLOPramide (REGLAN) injection  10 mg Intravenous Q6H   pantoprazole (PROTONIX) IV  40 mg Intravenous Q24H   saccharomyces boulardii  250 mg Oral BID    Studies/Results: DG Abd Portable 2V  Result Date: 03/15/2021 CLINICAL DATA:  Nausea, vomiting, abdominal pain EXAM: PORTABLE ABDOMEN - 2 VIEW COMPARISON:  None. FINDINGS: There is mild dilation of small-bowel loops measuring up to 3.3 cm in diameter. Surgical staples are seen in the right lower quadrant of abdomen. Stomach is not distended. Gas is present in colon. No abnormal masses or calcifications are seen. IMPRESSION: There is mild dilation of small-bowel loops suggesting  ileus. Electronically Signed   By: Elmer Picker M.D.   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

## 2021-03-18 NOTE — Discharge Summary (Signed)
Patient ID: Cheyenne Scott [id number] 65 y.o. 1955-11-09  03/13/2021  Discharge date and time: 03/18/21   Admitting Physician: Rosario Adie  Discharge Physician: Rosario Adie  Admission Diagnoses: Ileostomy in place [facility]) [Z93.2]  Discharge Diagnoses: ileostomy and port in place  Operations: Procedure(s): ILEOSTOMY TAKEDOWN REMOVAL PORT-A-CATH    Discharged Condition: good    Hospital Course: Patient was admitted to the med surg floor after surgery.  Diet was advanced as tolerated.  She had a mild ileus which required her to advance slowly.  Patient began to have bowel function on postop day 3.  By postop day 5, She was tolerating a solid diet and pain was controlled with oral medications.  She was urinating without difficulty and ambulating without assistance.  Patient was felt to be in stable condition for discharge to home.   Consults: None  Significant Diagnostic Studies: labs: cbc, bmet  Treatments: IV hydration, analgesia: toraldol, and surgery: ileostomy reversal and port removal  Disposition: Home

## 2021-03-18 NOTE — Progress Notes (Deleted)
Transition of Care [facility]) Screening Note  Patient Details  Name: Cheyenne Scott Date of Birth: 03/04/1955  Transition of Care [facility]) CM/SW Contact:    Sherie Don, LCSW Phone Number: 03/18/2021, 10:41 AM  Transition of Care Department [facility]) has reviewed patient and no TOC needs have been identified at this time. We will continue to monitor patient advancement through interdisciplinary progression rounds. If new patient transition needs arise, please place a TOC consult.

## 2021-03-18 NOTE — Plan of Care (Signed)

## 2021-03-18 NOTE — Progress Notes (Signed)
Pt was discharged home today. Instructions were reviewed with patient, and questions were answered. Pt was taken to main entrance via wheelchair by NT.  

## 2021-03-24 HISTORY — PX: TAKEDOWN ILEOSTOMY: SHX510140

## 2021-04-03 ENCOUNTER — Inpatient Hospital Stay: Payer: Medicare Other | Admitting: Hematology and Oncology

## 2021-04-03 ENCOUNTER — Inpatient Hospital Stay: Payer: Medicare Other

## 2021-06-26 ENCOUNTER — Inpatient Hospital Stay (HOSPITAL_BASED_OUTPATIENT_CLINIC_OR_DEPARTMENT_OTHER): Payer: Medicare Other | Admitting: Hematology and Oncology

## 2021-06-26 ENCOUNTER — Other Ambulatory Visit: Payer: Self-pay

## 2021-06-26 ENCOUNTER — Inpatient Hospital Stay: Payer: Medicare Other | Attending: Hematology and Oncology

## 2021-06-26 VITALS — BP 123/60 | HR 76 | Temp 97.6°F | Resp 16 | Wt 142.1 lb

## 2021-06-26 DIAGNOSIS — C775 Secondary and unspecified malignant neoplasm of intrapelvic lymph nodes: Secondary | ICD-10-CM | POA: Insufficient documentation

## 2021-06-26 DIAGNOSIS — C20 Malignant neoplasm of rectum: Secondary | ICD-10-CM

## 2021-06-26 DIAGNOSIS — Z95828 Presence of other vascular implants and grafts: Secondary | ICD-10-CM

## 2021-06-26 LAB — CMP (CANCER CENTER ONLY)
ALT: 16 U/L (ref 0–44)
AST: 24 U/L (ref 15–41)
Albumin: 4.2 g/dL (ref 3.5–5.0)
Alkaline Phosphatase: 82 U/L (ref 38–126)
Anion gap: 7 (ref 5–15)
BUN: 11 mg/dL (ref 8–23)
CO2: 29 mmol/L (ref 22–32)
Calcium: 9.6 mg/dL (ref 8.9–10.3)
Chloride: 107 mmol/L (ref 98–111)
Creatinine: 0.85 mg/dL (ref 0.44–1.00)
GFR, Estimated: >60 mL/min (ref >60)
Glucose, Bld: 111 mg/dL — ABNORMAL HIGH (ref 70–99)
Potassium: 3.5 mmol/L (ref 3.5–5.1)
Sodium: 143 mmol/L (ref 135–145)
Total Bilirubin: 0.7 mg/dL (ref 0.3–1.2)
Total Protein: 7.4 g/dL (ref 6.5–8.1)

## 2021-06-26 LAB — CBC WITH DIFFERENTIAL (CANCER CENTER ONLY)
Abs Immature Granulocytes: 0.01 10*3/uL (ref 0.00–0.07)
Basophils Absolute: 0.1 10*3/uL (ref 0.0–0.1)
Basophils Relative: 2 %
Eosinophils Absolute: 0.1 10*3/uL (ref 0.0–0.5)
Eosinophils Relative: 3 %
HCT: 35.9 % — ABNORMAL LOW (ref 36.0–46.0)
Hemoglobin: 12 g/dL (ref 12.0–15.0)
Immature Granulocytes: 0 %
Lymphocytes Relative: 27 %
Lymphs Abs: 1.1 10*3/uL (ref 0.7–4.0)
MCH: 30.3 pg (ref 26.0–34.0)
MCHC: 33.4 g/dL (ref 30.0–36.0)
MCV: 90.7 fL (ref 80.0–100.0)
Monocytes Absolute: 0.5 10*3/uL (ref 0.1–1.0)
Monocytes Relative: 12 %
Neutro Abs: 2.1 10*3/uL (ref 1.7–7.7)
Neutrophils Relative %: 56 %
Platelet Count: 209 10*3/uL (ref 150–400)
RBC: 3.96 MIL/uL (ref 3.87–5.11)
RDW: 13.1 % (ref 11.5–15.5)
WBC Count: 3.9 10*3/uL — ABNORMAL LOW (ref 4.0–10.5)
nRBC: 0 % (ref 0.0–0.2)
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
## 2021-07-03 ENCOUNTER — Telehealth: Payer: Self-pay | Admitting: *Deleted

## 2021-07-03 ENCOUNTER — Ambulatory Visit (HOSPITAL_COMMUNITY): Payer: Medicare Other

## 2021-07-03 NOTE — Telephone Encounter (Signed)
Received vm message from pt with questions regarding CT scan.  Attempted call back. No answer but was able to leave vm message for pt to call back @ [phone]

## 2021-07-03 NOTE — Telephone Encounter (Signed)
Received call back from pt. Her main concern is drinking the contrast for her upcoming CT scans. She states that since her ileostomy reversal, food liquids move through quite quickly. She is not sure she can drink the oral contrast and not have to start using the bathroom many times before the scan even starts. Advised that I would speak with CT radiology and find out what their recommendations are. Spoke with radiology dept. They recommended she try the water based contrast. They were able to move her scan to 10 am on th 5/30 so she will have time to drink the contrast there. Pt is arrive a bit before 8am on 07/07/21. Advised pt of the above. She states she will try her best that day.

## 2021-07-07 ENCOUNTER — Encounter (HOSPITAL_COMMUNITY): Payer: Self-pay

## 2021-07-07 ENCOUNTER — Ambulatory Visit (HOSPITAL_COMMUNITY)
Admission: RE | Admit: 2021-07-07 | Discharge: 2021-07-07 | Disposition: A | Discharge status: Home / Self Care | Payer: Medicare Other | Source: Ambulatory Visit | Attending: Hematology and Oncology | Admitting: Hematology and Oncology

## 2021-07-07 DIAGNOSIS — C20 Malignant neoplasm of rectum: Secondary | ICD-10-CM | POA: Insufficient documentation

## 2021-07-07 MED ORDER — IOHEXOL 9 MG/ML PO SOLN: 1000.0000 mL | ORAL | Status: AC

## 2021-07-07 MED ORDER — IOHEXOL 9 MG/ML PO SOLN
ORAL | Status: AC
  Administered 2021-07-07: 1000 mL via ORAL
  Filled 2021-07-07: qty 1000

## 2021-07-07 MED ORDER — SODIUM CHLORIDE (PF) 0.9 % IJ SOLN
INTRAMUSCULAR | Status: AC
  Filled 2021-07-07: qty 50

## 2021-07-07 MED ORDER — IOHEXOL 300 MG/ML  SOLN
100.0000 mL | Freq: Once | INTRAMUSCULAR | Status: AC | PRN
  Administered 2021-07-07: 100 mL via INTRAVENOUS

## 2021-07-09 ENCOUNTER — Telehealth: Payer: Self-pay | Admitting: *Deleted

## 2021-07-09 NOTE — Telephone Encounter (Signed)
-----   Message from [name], MD sent at 07/07/2021  2:42 PM EDT ----- Please let Ms. Riesen know that her CT scan shows no evidence of new or recurrent disease.  ----- Message ----- From: Buel Ream, Rad Results In Sent: 07/07/2021   2:00 PM EDT To: [name], MD

## 2021-07-09 NOTE — Telephone Encounter (Signed)
TCT patient regarding recent scan results.  Spoke with pt and advised that her CT scans were goo-no evidence of recurrent or new disease. Pt very pleased. She states she will be moving to [place], [place] to help take care of her son's 51 month old child.  She states she will call when she gets an oncologist there so we can get there records to that practice.

## 2021-08-31 ENCOUNTER — Other Ambulatory Visit: Payer: Self-pay

## 2021-09-08 ENCOUNTER — Other Ambulatory Visit: Payer: Self-pay

## 2021-10-01 ENCOUNTER — Telehealth: Payer: Self-pay | Admitting: *Deleted

## 2021-10-01 ENCOUNTER — Inpatient Hospital Stay: Payer: Medicare Other | Admitting: Hematology and Oncology

## 2021-10-01 ENCOUNTER — Other Ambulatory Visit: Payer: Self-pay | Admitting: Hematology and Oncology

## 2021-10-01 ENCOUNTER — Inpatient Hospital Stay: Payer: Medicare Other

## 2021-10-01 DIAGNOSIS — C20 Malignant neoplasm of rectum: Secondary | ICD-10-CM

## 2021-10-01 NOTE — Telephone Encounter (Signed)
Received call back from pt. She states she is currently living in [place] with her son and then will be moving to [place], [place].   She will not be here for her appt today. She is doing well. She will be having a n appt in [place] with her new Oncologist soon.  She does not have any needs at this time. Appt cancelled for today

## 2021-10-01 NOTE — Telephone Encounter (Signed)
Opened in error

## 2021-10-01 NOTE — Telephone Encounter (Signed)
TCT patient regarding her appt today. Pt has moved to [place](?) and call was to see if pt planned on coming today or not. No answer. LVM for return call.

## 2021-10-09 ENCOUNTER — Other Ambulatory Visit: Payer: Self-pay

## 2021-10-18 ENCOUNTER — Inpatient Hospital Stay: Payer: Medicare Other

## 2021-10-18 ENCOUNTER — Emergency Department: Payer: Medicare Other

## 2021-10-18 ENCOUNTER — Inpatient Hospital Stay
Admission: EM | Admit: 2021-10-18 | Discharge: 2021-10-20 | DRG: 389 | Disposition: A | Discharge status: Home / Self Care | Payer: Medicare Other | Attending: Internal Medicine | Admitting: Internal Medicine

## 2021-10-18 DIAGNOSIS — D649 Anemia, unspecified: Secondary | ICD-10-CM | POA: Diagnosis present

## 2021-10-18 DIAGNOSIS — Z9889 Other specified postprocedural states: Secondary | ICD-10-CM

## 2021-10-18 DIAGNOSIS — R3915 Urgency of urination: Secondary | ICD-10-CM | POA: Diagnosis present

## 2021-10-18 DIAGNOSIS — E440 Moderate protein-calorie malnutrition: Secondary | ICD-10-CM | POA: Diagnosis present

## 2021-10-18 DIAGNOSIS — Z9049 Acquired absence of other specified parts of digestive tract: Secondary | ICD-10-CM

## 2021-10-18 DIAGNOSIS — Z6823 Body mass index (BMI) 23.0-23.9, adult: Secondary | ICD-10-CM

## 2021-10-18 DIAGNOSIS — K56600 Partial intestinal obstruction, unspecified as to cause: Principal | ICD-10-CM | POA: Diagnosis present

## 2021-10-18 DIAGNOSIS — Z923 Personal history of irradiation: Secondary | ICD-10-CM

## 2021-10-18 DIAGNOSIS — R109 Unspecified abdominal pain: Secondary | ICD-10-CM

## 2021-10-18 DIAGNOSIS — E860 Dehydration: Secondary | ICD-10-CM | POA: Diagnosis present

## 2021-10-18 DIAGNOSIS — Z85048 Personal history of other malignant neoplasm of rectum, rectosigmoid junction, and anus: Secondary | ICD-10-CM

## 2021-10-18 DIAGNOSIS — K56609 Unspecified intestinal obstruction, unspecified as to partial versus complete obstruction: Secondary | ICD-10-CM | POA: Diagnosis present

## 2021-10-18 DIAGNOSIS — K8020 Calculus of gallbladder without cholecystitis without obstruction: Secondary | ICD-10-CM | POA: Diagnosis present

## 2021-10-18 DIAGNOSIS — E876 Hypokalemia: Secondary | ICD-10-CM | POA: Diagnosis present

## 2021-10-18 DIAGNOSIS — Z9221 Personal history of antineoplastic chemotherapy: Secondary | ICD-10-CM

## 2021-10-18 DIAGNOSIS — R350 Frequency of micturition: Secondary | ICD-10-CM | POA: Diagnosis present

## 2021-10-18 HISTORY — DX: Malignant neoplasm of rectum: C20

## 2021-10-18 LAB — COMPREHENSIVE METABOLIC PANEL
ALT: 12 U/L (ref 0–55)
AST (SGOT): 20 U/L (ref 5–41)
Albumin/Globulin Ratio: 1.5 (ref 0.9–2.2)
Albumin: 4.3 g/dL (ref 3.5–5.0)
Alkaline Phosphatase: 86 U/L (ref 37–117)
Anion Gap: 13 (ref 5.0–15.0)
BUN: 15 mg/dL (ref 7.0–21.0)
Bilirubin, Total: 1 mg/dL (ref 0.2–1.2)
CO2: 23 mEq/L (ref 17–29)
Calcium: 9.9 mg/dL (ref 8.5–10.5)
Chloride: 105 mEq/L (ref 99–111)
Creatinine: 0.9 mg/dL (ref 0.4–1.0)
Globulin: 2.9 g/dL (ref 2.0–3.6)
Glucose: 160 mg/dL — ABNORMAL HIGH (ref 70–100)
Potassium: 3.3 mEq/L — ABNORMAL LOW (ref 3.5–5.3)
Protein, Total: 7.2 g/dL (ref 6.0–8.3)
Sodium: 141 mEq/L (ref 135–145)
eGFR: >60 mL/min/{1.73_m2} (ref >=60)

## 2021-10-18 LAB — URINALYSIS WITH REFLEX TO MICROSCOPIC EXAM - REFLEX TO CULTURE
Bilirubin, UA: NEGATIVE
Blood, UA: NEGATIVE
Glucose, UA: NEGATIVE
Nitrite, UA: NEGATIVE
Protein, UR: NEGATIVE
Specific Gravity UA: 1.011 (ref 1.001–1.035)
Urine pH: 6 (ref 5.0–8.0)
Urobilinogen, UA: NEGATIVE mg/dL (ref 0.2–2.0)

## 2021-10-18 LAB — CBC AND DIFFERENTIAL
Absolute NRBC: 0 10*3/uL (ref 0.00–0.00)
Basophils Absolute Automated: 0.06 10*3/uL (ref 0.00–0.08)
Basophils Automated: 0.8 %
Eosinophils Absolute Automated: 0 10*3/uL (ref 0.00–0.44)
Eosinophils Automated: 0 %
Hematocrit: 37.5 % (ref 34.7–43.7)
Hgb: 12.7 g/dL (ref 11.4–14.8)
Immature Granulocytes Absolute: 0.01 10*3/uL (ref 0.00–0.07)
Immature Granulocytes: 0.1 %
Instrument Absolute Neutrophil Count: 6.24 10*3/uL (ref 1.10–6.33)
Lymphocytes Absolute Automated: 0.59 10*3/uL (ref 0.42–3.22)
Lymphocytes Automated: 8.1 %
MCH: 30 pg (ref 25.1–33.5)
MCHC: 33.9 g/dL (ref 31.5–35.8)
MCV: 88.4 fL (ref 78.0–96.0)
MPV: 11.4 fL (ref 8.9–12.5)
Monocytes Absolute Automated: 0.38 10*3/uL (ref 0.21–0.85)
Monocytes: 5.2 %
Neutrophils Absolute: 6.24 10*3/uL (ref 1.10–6.33)
Neutrophils: 85.8 %
Nucleated RBC: 0 /100 WBC (ref 0.0–0.0)
Platelets: 224 10*3/uL (ref 142–346)
RBC: 4.24 10*6/uL (ref 3.90–5.10)
RDW: 13 % (ref 11–15)
WBC: 7.28 10*3/uL (ref 3.10–9.50)

## 2021-10-18 LAB — HIGH SENSITIVITY TROPONIN-I: hs Troponin-I: <2.7 ng/L

## 2021-10-18 LAB — TSH: TSH: 1.23 u[IU]/mL (ref 0.35–4.94)

## 2021-10-18 LAB — LIPASE: Lipase: 10 U/L (ref 8–78)

## 2021-10-18 LAB — LACTIC ACID: Lactic Acid: 1.2 mmol/L (ref 0.2–2.0)

## 2021-10-18 LAB — HIGH SENSITIVITY TROPONIN-I WITH DELTA: hs Troponin-I: <2.7 ng/L

## 2021-10-18 LAB — MAGNESIUM: Magnesium: 2.1 mg/dL (ref 1.6–2.6)

## 2021-10-18 MED ORDER — POTASSIUM CHLORIDE CRYS ER 20 MEQ PO TBCR
0.0000 meq | EXTENDED_RELEASE_TABLET | ORAL | Status: DC | PRN
Start: 2021-10-18 — End: 2021-10-20
  Administered 2021-10-20: 40 meq via ORAL
  Filled 2021-10-18: qty 2

## 2021-10-18 MED ORDER — ONDANSETRON HCL 4 MG/2ML IJ SOLN
4.0000 mg | Freq: Once | INTRAMUSCULAR | Status: AC
Start: 2021-10-18 — End: 2021-10-18
  Administered 2021-10-18: 4 mg via INTRAVENOUS
  Filled 2021-10-18: qty 2

## 2021-10-18 MED ORDER — IOHEXOL 300 MG/ML IJ SOLN
100.0000 mL | Freq: Once | INTRAMUSCULAR | Status: AC | PRN
Start: 2021-10-18 — End: 2021-10-18
  Administered 2021-10-18: 100 mL via INTRAVENOUS

## 2021-10-18 MED ORDER — BISACODYL 5 MG PO TBEC
10.0000 mg | DELAYED_RELEASE_TABLET | Freq: Every day | ORAL | Status: DC | PRN
Start: 2021-10-18 — End: 2021-10-20

## 2021-10-18 MED ORDER — MAGNESIUM SULFATE IN D5W 1-5 GM/100ML-% IV SOLN
1.0000 g | INTRAVENOUS | Status: DC | PRN
Start: 2021-10-18 — End: 2021-10-20

## 2021-10-18 MED ORDER — MELATONIN 3 MG PO TABS
3.0000 mg | ORAL_TABLET | Freq: Every evening | ORAL | Status: DC | PRN
Start: 2021-10-18 — End: 2021-10-20

## 2021-10-18 MED ORDER — DEXTROSE 10 % IV BOLUS
12.5000 g | INTRAVENOUS | Status: DC | PRN
Start: 2021-10-18 — End: 2021-10-20

## 2021-10-18 MED ORDER — ONDANSETRON HCL 4 MG/2ML IJ SOLN
4.0000 mg | Freq: Four times a day (QID) | INTRAMUSCULAR | Status: DC | PRN
Start: 2021-10-18 — End: 2021-10-20

## 2021-10-18 MED ORDER — GLUCAGON 1 MG IJ SOLR (WRAP)
1.0000 mg | INTRAMUSCULAR | Status: DC | PRN
Start: 2021-10-18 — End: 2021-10-20

## 2021-10-18 MED ORDER — POTASSIUM CHLORIDE 10 MEQ/100ML IV SOLN
10.0000 meq | INTRAVENOUS | Status: DC | PRN
Start: 2021-10-18 — End: 2021-10-20
  Administered 2021-10-19: 10 meq via INTRAVENOUS
  Filled 2021-10-18: qty 100

## 2021-10-18 MED ORDER — GLUCOSE 40 % PO GEL (WRAP)
15.0000 g | ORAL | Status: DC | PRN
Start: 2021-10-18 — End: 2021-10-20

## 2021-10-18 MED ORDER — DEXTROSE 50 % IV SOLN
12.5000 g | INTRAVENOUS | Status: DC | PRN
Start: 2021-10-18 — End: 2021-10-20

## 2021-10-18 MED ORDER — MORPHINE SULFATE 2 MG/ML IJ/IV SOLN (WRAP)
2.0000 mg | Status: DC | PRN
Start: 2021-10-18 — End: 2021-10-20

## 2021-10-18 MED ORDER — ONDANSETRON 4 MG PO TBDP
4.0000 mg | ORAL_TABLET | Freq: Four times a day (QID) | ORAL | Status: DC | PRN
Start: 2021-10-18 — End: 2021-10-20

## 2021-10-18 MED ORDER — CIPROFLOXACIN IN D5W 400 MG/200ML IV SOLN
400.0000 mg | Freq: Two times a day (BID) | INTRAVENOUS | Status: AC
Start: 2021-10-18 — End: 2021-10-20
  Administered 2021-10-18 – 2021-10-20 (×4): 400 mg via INTRAVENOUS
  Filled 2021-10-18 (×4): qty 200

## 2021-10-18 MED ORDER — PHENOL 1.4 % MT LIQD
1.0000 | OROMUCOSAL | Status: DC | PRN
Start: 2021-10-18 — End: 2021-10-20

## 2021-10-18 MED ORDER — FAMOTIDINE 10 MG/ML IV SOLN (WRAP)
20.0000 mg | Freq: Two times a day (BID) | INTRAVENOUS | Status: DC
Start: 2021-10-18 — End: 2021-10-20
  Administered 2021-10-18 – 2021-10-19 (×4): 20 mg via INTRAVENOUS
  Filled 2021-10-18 (×4): qty 2

## 2021-10-18 MED ORDER — POTASSIUM CHLORIDE 20 MEQ PO PACK
40.0000 meq | PACK | Freq: Once | ORAL | Status: AC
Start: 2021-10-18 — End: 2021-10-18
  Administered 2021-10-18: 40 meq via ORAL
  Filled 2021-10-18: qty 2

## 2021-10-18 MED ORDER — DEXTROSE-SODIUM CHLORIDE 5-0.45 % IV SOLN
INTRAVENOUS | Status: DC
Start: 2021-10-18 — End: 2021-10-19

## 2021-10-18 MED ORDER — HEPARIN SODIUM (PORCINE) 5000 UNIT/ML IJ SOLN
5000.0000 [IU] | Freq: Three times a day (TID) | INTRAMUSCULAR | Status: DC
Start: 2021-10-18 — End: 2021-10-20
  Administered 2021-10-18 – 2021-10-19 (×3): 5000 [IU] via SUBCUTANEOUS
  Filled 2021-10-18 (×4): qty 1

## 2021-10-18 MED ORDER — NALOXONE HCL 0.4 MG/ML IJ SOLN (WRAP)
0.2000 mg | INTRAMUSCULAR | Status: DC | PRN
Start: 2021-10-18 — End: 2021-10-20

## 2021-10-18 MED ORDER — KETOROLAC TROMETHAMINE 15 MG/ML IJ SOLN
15.0000 mg | Freq: Four times a day (QID) | INTRAMUSCULAR | Status: AC | PRN
Start: 2021-10-18 — End: 2021-10-20

## 2021-10-18 MED ORDER — ACETAMINOPHEN 325 MG PO TABS
650.0000 mg | ORAL_TABLET | Freq: Four times a day (QID) | ORAL | Status: DC | PRN
Start: 2021-10-18 — End: 2021-10-20
  Administered 2021-10-18: 650 mg via ORAL
  Filled 2021-10-18: qty 2

## 2021-10-18 MED ORDER — KETOROLAC TROMETHAMINE 15 MG/ML IJ SOLN
15.0000 mg | Freq: Once | INTRAMUSCULAR | Status: AC
Start: 2021-10-18 — End: 2021-10-18
  Administered 2021-10-18: 15 mg via INTRAVENOUS
  Filled 2021-10-18: qty 1

## 2021-10-18 MED ORDER — SODIUM CHLORIDE 0.9 % IV BOLUS
1000.0000 mL | Freq: Once | INTRAVENOUS | Status: AC
Start: 2021-10-18 — End: 2021-10-18
  Administered 2021-10-18: 1000 mL via INTRAVENOUS

## 2021-10-18 MED ORDER — ACETAMINOPHEN 650 MG RE SUPP
650.0000 mg | Freq: Four times a day (QID) | RECTAL | Status: DC | PRN
Start: 2021-10-18 — End: 2021-10-20

## 2021-10-18 MED ORDER — FAMOTIDINE 20 MG PO TABS
20.0000 mg | ORAL_TABLET | Freq: Two times a day (BID) | ORAL | Status: DC
Start: 2021-10-18 — End: 2021-10-20
  Administered 2021-10-20: 20 mg via ORAL
  Filled 2021-10-18: qty 1

## 2021-10-18 MED ORDER — POTASSIUM & SODIUM PHOSPHATES 280-160-250 MG PO PACK
2.0000 | PACK | ORAL | Status: DC | PRN
Start: 2021-10-18 — End: 2021-10-20

## 2021-10-18 NOTE — Plan of Care (Signed)
Patient just moved here and has history of T3N1 rectal cancer, sp neoadj folfox and subsequent LAR with ileostomy 2022, reversal of stoma 2023 in February.     Presents with pain, decreased flatus and watery stool, CT with findings c/w SBO.    Would appreciate admit to hospitalist, npo with NGT placement.Full consult to follow.    Barbara Cower. Allesandra Huebsch DO

## 2021-10-18 NOTE — Plan of Care (Signed)
Problem: Safety  Goal: Patient will be free from injury during hospitalization  Outcome: Progressing  Flowsheets (Taken 10/18/2021 1821)  Patient will be free from injury during hospitalization:   Assess patient's risk for falls and implement fall prevention plan of care per policy   Provide and maintain safe environment   Use appropriate transfer methods   Ensure appropriate safety devices are available at the bedside   Include patient/ family/ care giver in decisions related to safety   Hourly rounding  Goal: Patient will be free from infection during hospitalization  Outcome: Progressing  Flowsheets (Taken 10/18/2021 1821)  Free from Infection during hospitalization:   Assess and monitor for signs and symptoms of infection   Monitor all insertion sites (i.e. indwelling lines, tubes, urinary catheters, and drains)   Encourage patient and family to use good hand hygiene technique   Monitor lab/diagnostic results

## 2021-10-18 NOTE — Consults (Addendum)
Consultation Date Time: 10/18/21 3:16 PM  Date of Admission: 10/18/2021     Requesting Physician: Dallie Piles, MD  Consulting Physician: Barbara Cower Veronica Fretz, DO  Consulting Service: General Surgery    Reason For Consultation:  Abdominal Pain      Impression:     Patient Active Problem List   Diagnosis    SBO (small bowel obstruction)     Diagnosis is SBO. Exam is fairly benign with mild tenderness and no rebound, labs without leukocytosis or lactic acidosis, vital signs not deranged.     Plan:     - Admit for NGT decompression and conservative management for now.  - NPO, IVF  - Gastrografin challenge tomorrow if not resolving yet  - Discussed spectrum of small bowel obstructions and the chance for surgery if this does not resolve on its own.      History of Present Illness:   Aimee Watson is a 66 y.o. female who  has a past medical history of Rectal adenocarcinoma.. Patient just moved here from [place] to be with her son and has history of T3N1 rectal cancer, sp neoadj folfox and subsequent LAR with diverting ileostomy 2022, reversal of stoma 2023 in February. Since then she has been following up regularly and her bowel function oscillates between harder stools and multiple loose bowel movements a day. This week she "hasnt felt right", and started developing pain yesterday and has not passed gas since then. She has had loose watery diarrhea. CT imaging showing dilated small bowel with possible transition near prior ileostomy reversal anastomosis. Endorses vomiting yesterday. Pain has not gotten worse since then.       Past Medical History:     Past Medical History:   Diagnosis Date    Rectal adenocarcinoma        Past Surgical History:     Past Surgical History:   Procedure Laterality Date    ROBOTIC, COLECTOMY, LAR  10/21/2020    [place] ILEOSTOMY  03/24/2021       Family History:   History reviewed. No pertinent family history.    Social History:     Social History     Socioeconomic History     Marital status: Widowed     Spouse name: Not on file    Number of children: Not on file    Years of education: Not on file    Highest education level: Not on file   Occupational History    Not on file   Tobacco Use    Smoking status: Never    Smokeless tobacco: Never   Substance and Sexual Activity    Alcohol use: Not on file    Drug use: Not on file    Sexual activity: Not on file   Other Topics Concern    Not on file   Social History Narrative    Not on file     Social Determinants of Health     Financial Resource Strain: Not on file   Food Insecurity: Not on file   Transportation Needs: Not on file   Physical Activity: Not on file   Stress: Not on file   Social Connections: Not on file   Intimate Partner Violence: Not on file   Housing Stability: Not on file       Allergies:     Allergies   Allergen Reactions    Penicillins        Medications:     Prior to Admission medications  Not on File       Review of Systems:   As per the HPI.  The patient denies any additional changes to their otic, opthalmologic, dermatologic, pulmonary, cardiac, gastrointestinal, genitourinary, musculoskeletal, hematologic, constitutional, or psychiatric systems.      Physical Exam:     Vitals:    10/18/21 1409   BP: 143/64   Pulse: 73   Resp: 16   Temp: 98.2 F (36.8 C)   SpO2: 99%     Physical Exam  Constitutional:       Appearance: Normal appearance. She is normal weight.   HENT:      Head: Normocephalic and atraumatic.      Nose: Nose normal.      Comments: NGT in place     Mouth/Throat:      Mouth: Mucous membranes are moist.      Pharynx: Oropharynx is clear.   Eyes:      General: No scleral icterus.     Conjunctiva/sclera: Conjunctivae normal.   Cardiovascular:      Rate and Rhythm: Normal rate and regular rhythm.   Pulmonary:      Effort: Pulmonary effort is normal.      Breath sounds: No stridor. No wheezing.   Abdominal:      Palpations: Abdomen is soft.      Tenderness: There is abdominal tenderness (mildly tender in lower  abdomen).   Musculoskeletal:         General: No swelling or tenderness. Normal range of motion.      Cervical back: Neck supple. No rigidity.   Skin:     General: Skin is warm.      Coloration: Skin is not jaundiced.      Findings: No bruising or rash.   Neurological:      General: No focal deficit present.      Mental Status: She is alert. Mental status is at baseline.   Psychiatric:         Mood and Affect: Mood normal.             Labs:     Results       Procedure Component Value Units Date/Time    TSH [[id number]] Collected: 10/18/21 0803    Specimen: Blood Updated: 10/18/21 1514     TSH 1.23 uIU/mL     High Sensitivity Troponin-I at 0 hrs [[id number]] Collected: 10/18/21 0803    Specimen: Blood Updated: 10/18/21 1435    Urine culture [[id number]] Collected: 10/18/21 0943    Specimen: Urine, Clean Catch Updated: 10/18/21 1242    Narrative:      Indications for Urine Culture:->Suprapubic Pain/Tenderness or  Dysuria    Lactic Acid [[id number]] Collected: 10/18/21 1119    Specimen: Blood Updated: 10/18/21 1126     Lactic Acid 1.2 mmol/L     Urinalysis Reflex to Microscopic Exam- Reflex to Culture [[id number]]  (Abnormal) Collected: 10/18/21 0943     Updated: 10/18/21 1014     Urine Type Urine, Clean Ca     Color, UA Straw     Clarity, UA Clear     Specific Gravity UA 1.011     Urine pH 6.0     Leukocyte Esterase, UA Trace     Nitrite, UA Negative     Protein, UR Negative     Glucose, UA Negative     Ketones UA Trace     Urobilinogen, UA Negative mg/dL      Bilirubin, UA Negative  Blood, UA Negative     RBC, UA 11 - 25 /hpf      WBC, UA 6 - 10 /hpf      Urine Mucus Present    Magnesium [[id number]] Collected: 10/18/21 0803    Specimen: Blood Updated: 10/18/21 0949     Magnesium 2.1 mg/dL     Comprehensive metabolic panel [[id number]]  (Abnormal) Collected: 10/18/21 0803    Specimen: Blood Updated: 10/18/21 0851     Glucose 160 mg/dL      BUN 78.2 mg/dL      Creatinine 0.9 mg/dL      Sodium 956 mEq/L      Potassium 3.3  mEq/L      Chloride 105 mEq/L      CO2 23 mEq/L      Calcium 9.9 mg/dL      Protein, Total 7.2 g/dL      Albumin 4.3 g/dL      AST (SGOT) 20 U/L      ALT 12 U/L      Alkaline Phosphatase 86 U/L      Bilirubin, Total 1.0 mg/dL      Globulin 2.9 g/dL      Albumin/Globulin Ratio 1.5     Anion Gap 13.0     eGFR >60.0 mL/min/1.73 m2     Lipase [[id number]] Collected: 10/18/21 0803     Updated: 10/18/21 0851     Lipase 10 U/L     CBC and differential [[id number]] Collected: 10/18/21 0803    Specimen: Blood Updated: 10/18/21 0834     WBC 7.28 x10 3/uL      Hgb 12.7 g/dL      Hematocrit 41.3 %      Platelets 224 x10 3/uL      RBC 4.24 x10 6/uL      MCV 88.4 fL      MCH 30.0 pg      MCHC 33.9 g/dL      RDW 13 %      MPV 11.4 fL      Instrument Absolute Neutrophil Count 6.24 x10 3/uL      Neutrophils 85.8 %      Lymphocytes Automated 8.1 %      Monocytes 5.2 %      Eosinophils Automated 0.0 %      Basophils Automated 0.8 %      Immature Granulocytes 0.1 %      Nucleated RBC 0.0 /100 WBC      Neutrophils Absolute 6.24 x10 3/uL      Lymphocytes Absolute Automated 0.59 x10 3/uL      Monocytes Absolute Automated 0.38 x10 3/uL      Eosinophils Absolute Automated 0.00 x10 3/uL      Basophils Absolute Automated 0.06 x10 3/uL      Immature Granulocytes Absolute 0.01 x10 3/uL      Absolute NRBC 0.00 x10 3/uL             Rads:     Radiology Results (24 Hour)       Procedure Component Value Units Date/Time    Abdomen 2 View With Chest 1 View [[id number]] Collected: 10/18/21 1257    Order Status: Completed Updated: 10/18/21 1301    Narrative:      ACUTE ABDOMINAL SERIES: Frontal chest, upright and supine abdomen.    CLINICAL STATEMENT: Bowel obstruction.     COMPARISON: CT of the abdomen and pelvis performed on 10/18/2021.     FINDINGS: An NG tube is present,  its tip ends overlying the body the  stomach.    The lungs lungs are clear. The cardiomediastinal silhouette is unremarkable  in appearance. No free air is identified under the  diaphragm.     Mildly dilated loops of small bowel are identified of the mid abdomen. The  large bowel is decompressed containing air and stool.    Contrast is identified within the renal collecting systems, ureters and  bladder, excreted following the recent CT. A 2 cm calculus is identified  overlying the gallbladder fossa. No significant osseous abnormalities can  be identified.      Impression:        Mildly dilated loops of small bowel throughout the mid abdomen.     Fonnie Mu, DO  10/18/2021 12:59 PM    CT Abd/Pelvis with IV Contrast [[id number]] Collected: 10/18/21 1048    Order Status: Completed Updated: 10/18/21 1105    Narrative:      CT ABDOMEN PELVIS W IV/ WO PO CONT: 10/18/2021 10:36 AM    CLINICAL INFORMATION  abdominal pain, worse around site of ileostomy reversal, hx of ileus and  rectal cancer, vomiting and diarrhea.    COMPARISON  None.    TECHNIQUE   Helical axial imaging from above the domes of diaphragm through the pubic  symphysis following administration of 100 cc of Omnipaque 350 intravenously  and without intraluminal bowel contrast. Sagittal and coronal  reconstructions were obtained.     Note that CT scanning at this site utilizes multiple dose reduction  techniques including automatic exposure control, adjustment of the MAS  and/or KVP according to patient size, and use of iterative reconstruction  technique.    FINDINGS  Lung base: No evidence of pulmonary parenchymal consolidation.    Liver: Unremarkable.  Gallbladder and biliary tree: The gallbladder is mildly distended and  contains a peripherally calcified gallstone. No abnormal gallbladder wall  thickening or pericholecystic fluid.  Pancreas: Diffuse pancreatic atrophy.  Spleen: Normal.  Adrenals: Normal.  Kidneys and ureters: Normal corticomedullary differentiation without  evidence of hydronephrosis.    Stomach: The stomach is predominantly decompressed but otherwise  unremarkable.  Small bowel: Proximal small bowel is normal in  caliber. There is moderate  fluid-filled distention of the mid to distal small bowel, with transition  point to collapsed loops of small bowel within the right lower quadrant,  near the site of the ileostomy reversal. There is small bowel fecalization  within the distended small bowel loops.   Large bowel: The colon is predominantly fluid and stool filled, extending  to the rectum.  Appendix: Not visualized.    Lymph nodes: No pathologically enlarged lymph nodes.    Vessels: Hepatic and mesenteric vasculature are patent.    Peritoneum: No ascites or free air. No other fluid collection.  Retroperitoneum: Unremarkable.  Abdominal wall: Soft tissue stranding noted within the right upper quadrant  likely representing site of previous ileostomy.    Bladder: Partially decompressed but otherwise unremarkable.  Reproductive organs: The uterus is surgical absent.  The bilateral adnexa  are unremarkable.    Osseous structures: Minimal lumbar degenerative changes. No suspicious  lytic or sclerotic osseous lesions.      Impression:          1. Findings consistent with high-grade partial versus early complete small  bowel obstruction, with transition point within the right lower quadrant  near the ileostomy reversal site.  2. Cholelithiasis without CT evidence of acute cholecystitis.        Casimiro Needle  Doxey  10/18/2021 11:03 AM            Signed by: Barbara Cower Tajai Ihde, DO

## 2021-10-18 NOTE — ED Notes (Signed)
Pt. To floor; transport arrived

## 2021-10-18 NOTE — Progress Notes (Signed)
Patient admitted to unit accompanied by her son. AAO x4. Breathing easily and quietly. Abdomen soft, flat, bowel sounds hypoactive. NG tube in place. Patient orientated to unit. Call bell and bedside table within reach.

## 2021-10-18 NOTE — ED to IP RN Note (Addendum)
[place] EMERGENCY DEPARTMENT  ED NURSING NOTE FOR THE RECEIVING INPATIENT NURSE   ED NURSE Olin Hauser [phone]   ED CHARGE RN grace   ADMISSION INFORMATION   Aimee Watson is a 66 y.o. female admitted with an ED diagnosis of:    No diagnosis found.     Isolation: None   Allergies: Penicillins   Holding Orders confirmed? Yes   Belongings Documented? Yes   Home medications sent to pharmacy confirmed? N/A   NURSING CARE   Patient Comes From:   Mental Status: Home Independent  alert and oriented   ADL: Independent with all ADLs   Ambulation: no difficulty   Pertinent Information  and Safety Concerns:     [place] Violence Risk Level: Low CT: Findings consistent with high-grade partial versus early complete small   bowel obstruction, with transition point within the right lower quadrant near the ileostomy reversal site. ; ng tube in place R nare     CT / NIH   CT Head ordered on this patient?  No   NIH/Dysphagia assessment done prior to admission? No   VITAL SIGNS (at the time of this note)      Vitals:    10/18/21 1008   BP: 123/59   Pulse: 80   Resp: 16   Temp:    SpO2: 99%

## 2021-10-18 NOTE — ED Provider Notes (Signed)
EMERGENCY DEPARTMENT HISTORY AND PHYSICAL EXAM    Date: 10/18/2021   Patient Name: Aimee Watson  Attending Physician: Marlis Edelson, DO   Advanced Practice Provider: Carmelina Paddock, PA    History of Presenting Illness     History Provided By: Patient  Chief Complaint:  Chief Complaint   Patient presents with    Abdominal Pain     Aimee Watson is a 66 y.o. female PMH rectal adenocarcinoma presenting to the ED with too numerous to count episodes of nonbloody nonbilious emesis, generalized abdominal pain, and 1 episode of nonbloody watery diarrhea since 9 PM last night.  Patient reports abdominal pain is localized to the area of her ileostomy reversal and is described as a sharp, cramping pain, constant.  Patient reports noticing increased foaminess of her urine with mild urgency over the past few days, denies dysuria or hematuria.  Patient reports history of ileus and reports that the abdominal pain feels similar.  Patient denies fevers, chills, chest pain, shortness of breath, recent travel, sick contacts, or other complaints.  History of multiple abdominal surgeries.    GI: Dr. Carney Harder  PMH, medications, allergies, surgical history, and social history reviewed.    Associated symptoms and pertinent negative listed in ROS.  Review of Systems   Review of Systems   Constitutional:  Negative for chills and fever.   Respiratory:  Negative for chest tightness and shortness of breath.    Cardiovascular:  Negative for chest pain.   Gastrointestinal:  Positive for abdominal pain, diarrhea, nausea and vomiting. Negative for abdominal distention, blood in stool, constipation and rectal pain.   Genitourinary:  Negative for decreased urine volume, dysuria, flank pain, frequency, hematuria, pelvic pain, urgency, vaginal bleeding, vaginal discharge and vaginal pain.   Musculoskeletal:  Negative for arthralgias, back pain, myalgias and neck pain.   Skin:  Negative for pallor and rash.   Allergic/Immunologic:  Negative for immunocompromised state.   Neurological:  Negative for dizziness, weakness, numbness and headaches.   All other systems reviewed and are negative.    Physical Exam   Pulse 84  BP 148/64  Resp 16  SpO2 100 %  Temp 97.9 F (36.6 C)    Pulse Oximetry Analysis - Normal SpO2: 100 % on RA    Physical Exam  Vitals and nursing note reviewed.   Constitutional:       General: She is not in acute distress.     Appearance: Normal appearance. She is well-developed and normal weight. She is not ill-appearing, toxic-appearing or diaphoretic.   HENT:      Head: Normocephalic and atraumatic.      Right Ear: External ear normal.      Left Ear: External ear normal.      Mouth/Throat:      Pharynx: Oropharynx is clear.   Eyes:      General: No scleral icterus.        Right eye: No discharge.         Left eye: No discharge.      Extraocular Movements: Extraocular movements intact.      Conjunctiva/sclera: Conjunctivae normal.   Cardiovascular:      Rate and Rhythm: Normal rate and regular rhythm.      Pulses: Normal pulses.      Heart sounds: Normal heart sounds. No murmur heard.  Pulmonary:      Effort: Pulmonary effort is normal. No respiratory distress.      Breath sounds: Normal breath sounds.  No stridor. No wheezing, rhonchi or rales.   Abdominal:      General: Abdomen is flat. A surgical scar is present. Bowel sounds are normal. There is no distension or abdominal bruit.      Palpations: Abdomen is soft. There is no mass or pulsatile mass.      Tenderness: There is generalized abdominal tenderness and tenderness in the right upper quadrant. There is no right CVA tenderness, left CVA tenderness, guarding or rebound. Negative signs include McBurney's sign.      Hernia: No hernia is present.   Musculoskeletal:         General: Normal range of motion.      Cervical back: Normal range of motion and neck supple. No rigidity or tenderness.      Right lower leg: No edema.      Left lower leg: No edema.   Skin:      General: Skin is warm.      Capillary Refill: Capillary refill takes less than 2 seconds.      Coloration: Skin is not jaundiced or pale.      Findings: No rash.      Comments: Examined where patient exposed    Neurological:      General: No focal deficit present.      Mental Status: She is alert and oriented to person, place, and time. Mental status is at baseline.      Gait: Gait normal.   Psychiatric:         Mood and Affect: Mood normal.         Behavior: Behavior normal.       Past History     Past Medical History:  Past Medical History:   Diagnosis Date    Rectal adenocarcinoma     Past Surgical History:  History reviewed. No pertinent surgical history.   Family/Social History:  She reports that she has never smoked. She has never used smokeless tobacco. No history on file for alcohol use and drug use.  History reviewed. No pertinent family history. Allergies:  Allergies   Allergen Reactions    Penicillins       Listed Medications on Arrival:  Home Medications       Med List Status: In Progress Set By: Kathi Der, RN at 10/18/2021  7:53 AM   No Medications        Primary Care Provider: Marisa Sprinkles, MD     Diagnostic Study Results   Labs -     Results       Procedure Component Value Units Date/Time    Lactic Acid [[id number]] Collected: 10/18/21 1119    Specimen: Blood Updated: 10/18/21 1126     Lactic Acid 1.2 mmol/L     Urinalysis Reflex to Microscopic Exam- Reflex to Culture [[id number]]  (Abnormal) Collected: 10/18/21 0943     Updated: 10/18/21 1014     Urine Type Urine, Clean Ca     Color, UA Straw     Clarity, UA Clear     Specific Gravity UA 1.011     Urine pH 6.0     Leukocyte Esterase, UA Trace     Nitrite, UA Negative     Protein, UR Negative     Glucose, UA Negative     Ketones UA Trace     Urobilinogen, UA Negative mg/dL      Bilirubin, UA Negative     Blood, UA Negative     RBC, UA 11 - 25 /hpf  WBC, UA 6 - 10 /hpf      Urine Mucus Present    Magnesium [[id number]] Collected: 10/18/21 0803     Specimen: Blood Updated: 10/18/21 0949     Magnesium 2.1 mg/dL     Comprehensive metabolic panel [[id number]]  (Abnormal) Collected: 10/18/21 0803    Specimen: Blood Updated: 10/18/21 0851     Glucose 160 mg/dL      BUN 78.2 mg/dL      Creatinine 0.9 mg/dL      Sodium 956 mEq/L      Potassium 3.3 mEq/L      Chloride 105 mEq/L      CO2 23 mEq/L      Calcium 9.9 mg/dL      Protein, Total 7.2 g/dL      Albumin 4.3 g/dL      AST (SGOT) 20 U/L      ALT 12 U/L      Alkaline Phosphatase 86 U/L      Bilirubin, Total 1.0 mg/dL      Globulin 2.9 g/dL      Albumin/Globulin Ratio 1.5     Anion Gap 13.0     eGFR >60.0 mL/min/1.73 m2     Lipase [[id number]] Collected: 10/18/21 0803     Updated: 10/18/21 0851     Lipase 10 U/L     CBC and differential [[id number]] Collected: 10/18/21 0803    Specimen: Blood Updated: 10/18/21 0834     WBC 7.28 x10 3/uL      Hgb 12.7 g/dL      Hematocrit 41.3 %      Platelets 224 x10 3/uL      RBC 4.24 x10 6/uL      MCV 88.4 fL      MCH 30.0 pg      MCHC 33.9 g/dL      RDW 13 %      MPV 11.4 fL      Instrument Absolute Neutrophil Count 6.24 x10 3/uL      Neutrophils 85.8 %      Lymphocytes Automated 8.1 %      Monocytes 5.2 %      Eosinophils Automated 0.0 %      Basophils Automated 0.8 %      Immature Granulocytes 0.1 %      Nucleated RBC 0.0 /100 WBC      Neutrophils Absolute 6.24 x10 3/uL      Lymphocytes Absolute Automated 0.59 x10 3/uL      Monocytes Absolute Automated 0.38 x10 3/uL      Eosinophils Absolute Automated 0.00 x10 3/uL      Basophils Absolute Automated 0.06 x10 3/uL      Immature Granulocytes Absolute 0.01 x10 3/uL      Absolute NRBC 0.00 x10 3/uL             Radiologic Studies -   Radiology Results (24 Hour)       Procedure Component Value Units Date/Time    CT Abd/Pelvis with IV Contrast [[id number]] Collected: 10/18/21 1048    Order Status: Completed Updated: 10/18/21 1105    Narrative:      CT ABDOMEN PELVIS W IV/ WO PO CONT: 10/18/2021 10:36 AM    CLINICAL INFORMATION  abdominal  pain, worse around site of ileostomy reversal, hx of ileus and  rectal cancer, vomiting and diarrhea.    COMPARISON  None.    TECHNIQUE   Helical axial imaging from above the domes of diaphragm through the pubic  symphysis following administration of 100 cc of Omnipaque 350 intravenously  and without intraluminal bowel contrast. Sagittal and coronal  reconstructions were obtained.     Note that CT scanning at this site utilizes multiple dose reduction  techniques including automatic exposure control, adjustment of the MAS  and/or KVP according to patient size, and use of iterative reconstruction  technique.    FINDINGS  Lung base: No evidence of pulmonary parenchymal consolidation.    Liver: Unremarkable.  Gallbladder and biliary tree: The gallbladder is mildly distended and  contains a peripherally calcified gallstone. No abnormal gallbladder wall  thickening or pericholecystic fluid.  Pancreas: Diffuse pancreatic atrophy.  Spleen: Normal.  Adrenals: Normal.  Kidneys and ureters: Normal corticomedullary differentiation without  evidence of hydronephrosis.    Stomach: The stomach is predominantly decompressed but otherwise  unremarkable.  Small bowel: Proximal small bowel is normal in caliber. There is moderate  fluid-filled distention of the mid to distal small bowel, with transition  point to collapsed loops of small bowel within the right lower quadrant,  near the site of the ileostomy reversal. There is small bowel fecalization  within the distended small bowel loops.   Large bowel: The colon is predominantly fluid and stool filled, extending  to the rectum.  Appendix: Not visualized.    Lymph nodes: No pathologically enlarged lymph nodes.    Vessels: Hepatic and mesenteric vasculature are patent.    Peritoneum: No ascites or free air. No other fluid collection.  Retroperitoneum: Unremarkable.  Abdominal wall: Soft tissue stranding noted within the right upper quadrant  likely representing site of previous  ileostomy.    Bladder: Partially decompressed but otherwise unremarkable.  Reproductive organs: The uterus is surgical absent.  The bilateral adnexa  are unremarkable.    Osseous structures: Minimal lumbar degenerative changes. No suspicious  lytic or sclerotic osseous lesions.      Impression:          1. Findings consistent with high-grade partial versus early complete small  bowel obstruction, with transition point within the right lower quadrant  near the ileostomy reversal site.  2. Cholelithiasis without CT evidence of acute cholecystitis.        Casimiro Needle [facility]  10/18/2021 11:03 AM          Procedures   Procedures:   Procedures  Medical Decision Making   I am the first provider for this patient. I reviewed the vital signs, available nursing notes, past medical history, past surgical history, family history and social history.  Old Medical Records: Old medical records.  Nursing notes.   Vital Signs-I have reviewed the patient's vital signs.   Patient Vitals for the past 12 hrs:   BP Temp Pulse Resp   10/18/21 1008 123/59 -- 80 16   10/18/21 0747 148/64 97.9 F (36.6 C) 84 16     Clinical Decision Support:         Medications Given in the ED:  ED Medication Orders (From admission, onward)      Start Ordered     Status Ordering Provider    10/18/21 1047 10/18/21 1047  iohexol (OMNIPAQUE) 300 MG/ML injection 100 mL  IMG once as needed        Route: Intravenous  Ordered Dose: 100 mL       Last MAR action: Imaging Agent Given Marlis Edelson    10/18/21 0915 10/18/21 0914  potassium chloride (KLOR-CON) packet 40 mEq  Once        Route: Oral  Ordered Dose: 40 mEq       Last MAR action: Given Oneisha Ammons    10/18/21 0914 10/18/21 0913  ketorolac (TORADOL) injection 15 mg  Once        Route: Intravenous  Ordered Dose: 15 mg       Last MAR action: Given Leomar Westberg    10/18/21 0806 10/18/21 0805  sodium chloride 0.9 % bolus 1,000 mL  Once        Route: Intravenous  Ordered Dose: 1,000 mL       Last MAR  action: Stopped Saivon Prowse    10/18/21 0806 10/18/21 0805  ondansetron (ZOFRAN) injection 4 mg  Once        Route: Intravenous  Ordered Dose: 4 mg       Last MAR action: Given Eligh Rybacki            Medications Prescribed:  Discharge Prescriptions    None         ED Course:   ED Course as of 10/18/21 1209   Sun Oct 18, 2021   1023 WBC: 7.28 [CM]   1023 Potassium(!): 3.3  Repleted K [CM]   1023 Lipase: 10 [CM]   1024 Magnesium: 2.1 [CM]   1128 Lactic Acid: 1.2 [CM]   1129 CT Abd/Pelvis with IV Contrast  1. Findings consistent with high-grade partial versus early complete small  bowel obstruction, with transition point within the right lower quadrant near the ileostomy reversal site.  2. Cholelithiasis without CT evidence of acute cholecystitis. [CM]   1144 Discussed case with Dr. Renae Fickle Will, general surgery, advises admission and NG tube. [CM]   1158 NG tube placed by RN [CM]   1206 Patient accepted for admission by Dr. Marland Mcalpine, sound.  [CM]      ED Course User Index  [CM] Carmelina Paddock, [place]       Provider Notes:   Medical Decision Making   66 y.o. female PMH rectal adenocarcinoma presenting to the ED with too numerous to count episodes of nonbloody nonbilious emesis, generalized abdominal pain, and 1 episode of nonbloody watery diarrhea since 9 PM last night.  Examination as above, notable for generalized abdominal tenderness worse in the right upper quadrant, abdomen soft, + bowel sounds.  Vitals stable, patient afebrile. Differential diagnoses considered include ACS, pancreatitis, cholecystitis, cholangitis, aortic dissection, pneumonia, appendicitis, bowel obstruction, nephrolithiasis, PUD, intestinal ischemia.  Lab work notable for no leukocytosis, mild hypokalemia likely secondary to volume loss, lactic acid within normal limits.  Urinalysis with trace leukocytes and WBCs.  Will send urine culture.  Potassium repleted.  CT abdomen pelvis with contrast significant for "high-grade partial versus  early complete small bowel obstruction with transition point in the right lower quadrant.  Cholelithiasis without cholecystitis."  Discussed case with general surgery, Dr. Stann Mainland, who advised NG tube and admission. Patient accepted for admission by Dr. Marland Mcalpine, sound hospitalist. Discussed plan with patient who is agreeable.  Patient's pain well controlled during ED stay, no further episodes of emesis.  NG tube placed and tolerated well.      Problems Addressed:  SBO (small bowel obstruction): acute illness or injury    Amount and/or Complexity of Data Reviewed  Labs: ordered. Decision-making details documented in ED Course.  Radiology: ordered. Decision-making details documented in ED Course.  ECG/medicine tests: ordered.    Risk  Prescription drug management.  Decision regarding hospitalization.         SEP-1 documentation    Please select option A (  exclude), B (severe sepsis), or C (septic shock) from the drop-down menu below:    A. ------------Severe Sepsis/Septic Shock Exclusion----------------------------    Pt is excluded from severe sepsis or septic shock consideration at 12:08 [enter time of bed request placement] on 10/18/21 because of one or more reasons below:    No SEP-1 qualifying organ dysfunction during ED stay.     D/w Marlis Edelson, DO    Diagnosis     Clinical Impression:   1. Small bowel obstruction    2. SBO (small bowel obstruction)        Treatment Plan:   ED Disposition       ED Disposition   Admit    Condition   --    Date/Time   Sun Oct 18, 2021 12:00 PM    Comment   Admitting Physician: Dallie Piles [[id number]]   Service:: Medicine [[id number]]   Estimated Length of Stay: 3 - 5 Days   Tentative Discharge Plan?: Home or Self Care [1]   Does patient need telemetry?: No               _____________________________    CHART OWNERSHIPSigmund Hazel, PA-C, am the primary clinician of record.       Carmelina Paddock, [place]  10/18/21 1221       Cassell Clement V, DO  10/21/21 1812

## 2021-10-18 NOTE — ED Triage Notes (Signed)
Aimee Watson is a 66 y.o. female  pt. presents with vomitting since 9PM last night ; lower abdominal pain ;hx of rectal cancer diagnosed dec. 2020; denies fever, water stools began this morning;BP 148/64   Pulse 84   Temp 97.9 F (36.6 C) (Oral)   Resp 16   Ht 5\' 3"  (1.6 m)   Wt 59.9 kg   SpO2 100%   BMI 23.38 kg/m

## 2021-10-18 NOTE — H&P (Signed)
ADMISSION HISTORY AND PHYSICAL EXAM    Date Time: 10/18/21 12:23 PM  Patient Name: Aimee Watson,Aimee Watson  Attending Physician: Dallie PilesSheikh, Farhan, MD    Assessment:   66 year old [place] and has history of rectal cancer status post surgery, chemo and radiation with ileostomy placed in 2022 and reversed in February of 2023, presents with generalized abdominal pain associated with nausea, vomiting, diarrhea and chills as well as some urinary symptoms, admitted for:    # Small bowel obstruction noted on CT imaging  #History of rectal cancer status post chemo/radiation with ileostomy in 2022 and reversal in February 2023  #History of small bowel obstruction in February 2023  General surgery consulted by the emergency room physician who recommended NG tube to continuous suction  We will admit patient to medical floor inpatient under sound physician  -Patient's pain control with 15 mg of IV Toradol, she received a liter of IV fluids and 4 mg of IV Zofran for nausea management.   -Continue Toradol 15 mg IV every 6 hours as needed for moderate pain and morphine 2 mg every 4 hours as needed for severe pain  -Continue IV Zofran 4 mg every 4 hours as needed for nausea management  -Keep patient n.p.o.  -Continue Chloraseptic for throat discomfort  -NG tube to wall suction  -KUB in a.m.  -Further recommendations per general surgery  -Follow labs and correct as needed    Hypokalemia  Replaced in the ED  We will repeat a BMP in a.m. for follow-up on potassium level and correct as needed    Lightheadedness with no associated chest pain, shortness of breath, palpitations  Likely due to dehydration  Patient received 1 L IV fluid bolus symptoms admission  EKG showed normal sinus rhythm at 75 with nonspecific ST abnormality  We will get a troponin level and TSH  Follow labs    Urinary frequency and urgency  UA with positive WBC and mild leuks  Urine cultures pending  Will empirically start patient  on IV ceftriaxone and de-escalate based on culture result      History of Present Illness:   Aimee Watson is a 66 y.o. female with a past medical history of rectal cancer diagnosed in December 2020 status post chemo and radiation with ileostomy, she had reversal of her stoma in February 2023 presented to the emergency room with constant generalized abdominal pain described as cramping sensation, worse in the upper quadrant, associated with recurrent nonbilious and nonbloody nausea, vomiting, chills and 1 episode of non-bloody diarrhea starting yesterday at 9 PM after eating a large meal. She also noticed increased urinary frequency, foaming and urgency for the past 3 days and mild lightheadedness as well. All symptoms current resolved.    She reports similar symptoms and episode of small bowel obstruction after her ostomy reversal in February 2023 which was treated medically with NG tube and NPO. She denies fever, blood in stool or vomitus, hematuria, chest pain, shortness of breath, sick contacts.  She reports intermittent loose stools since 20% of her rectum was surgically removed and was told it expected for least a year.    The emergency room, hemoglobin was reviewed including CT abdomen pelvis with contrast with findings consistent with small bowel obstruction.  General surgery Dr. Ramon DredgeEdward will consulted who recommended NG tube and will see patient today.  Labs showed potassium of 3.3 repleted in the ED.      Past Medical History:  Past Medical History:   Diagnosis Date    Rectal adenocarcinoma        Past Surgical History:   History reviewed. No pertinent surgical history.    Family History:   History reviewed. No pertinent family history.    Social History:     Social History     Socioeconomic History    Marital status: Widowed     Spouse name: Not on file    Number of children: Not on file    Years of education: Not on file    Highest education level: Not on file   Occupational History    Not on  file   Tobacco Use    Smoking status: Never    Smokeless tobacco: Never   Substance and Sexual Activity    Alcohol use: Not on file    Drug use: Not on file    Sexual activity: Not on file   Other Topics Concern    Not on file   Social History Narrative    Not on file     Social Determinants of Health     Financial Resource Strain: Not on file   Food Insecurity: Not on file   Transportation Needs: Not on file   Physical Activity: Not on file   Stress: Not on file   Social Connections: Not on file   Intimate Partner Violence: Not on file   Housing Stability: Not on file       Allergies:     Allergies   Allergen Reactions    Penicillins        Medications:   (Not in a hospital admission)      Review of Systems:   A comprehensive review of systems was:   All systems reviewed and noted as normal except as stated in HPI    Physical Exam:     Vitals:    10/18/21 1008   BP: 123/59   Pulse: 80   Resp: 16   Temp:    SpO2: 99%       Intake and Output Summary (Last 24 hours) at Date Time  No intake or output data in the 24 hours ending 10/18/21 1223    General appearance - alert, chronically ill appearing, and in distress 2/2 NGT irritation  Mental status - alert, oriented to person, place, and time  Eyes - pupils equal and reactive, extraocular eye movements intact  Ears - bilateral TM's and external ear canals normal  Nose - normal and patent, no erythema, discharge or polyps, NGT intact  Mouth - mucous membranes moist, pharynx normal without lesions  Neck - supple, no significant adenopathy  Chest - clear to auscultation, no wheezes, rales or rhonchi, symmetric air entry  Heart - normal rate, regular rhythm, normal S1, S2, no murmurs, rubs, clicks or gallops  Abdomen - soft, mild tenderness to deep palpation RUQ, nondistended, no masses or organomegaly  Neurological - alert, oriented, normal speech, no focal findings or movement disorder noted  Musculoskeletal - no joint tenderness, deformity or swelling  Extremities -  peripheral pulses normal, no pedal edema, no clubbing or cyanosis  Skin - normal coloration and turgor, no rashes, no suspicious skin lesions noted    Labs:     Results       Procedure Component Value Units Date/Time    Lactic Acid [[id number]] Collected: 10/18/21 1119    Specimen: Blood Updated: 10/18/21 1126     Lactic Acid 1.2 mmol/L  Urinalysis Reflex to Microscopic Exam- Reflex to Culture [[id number]]  (Abnormal) Collected: 10/18/21 0943     Updated: 10/18/21 1014     Urine Type Urine, Clean Ca     Color, UA Straw     Clarity, UA Clear     Specific Gravity UA 1.011     Urine pH 6.0     Leukocyte Esterase, UA Trace     Nitrite, UA Negative     Protein, UR Negative     Glucose, UA Negative     Ketones UA Trace     Urobilinogen, UA Negative mg/dL      Bilirubin, UA Negative     Blood, UA Negative     RBC, UA 11 - 25 /hpf      WBC, UA 6 - 10 /hpf      Urine Mucus Present    Magnesium [[id number]] Collected: 10/18/21 0803    Specimen: Blood Updated: 10/18/21 0949     Magnesium 2.1 mg/dL     Comprehensive metabolic panel [[id number]]  (Abnormal) Collected: 10/18/21 0803    Specimen: Blood Updated: 10/18/21 0851     Glucose 160 mg/dL      BUN 44.0 mg/dL      Creatinine 0.9 mg/dL      Sodium 347 mEq/L      Potassium 3.3 mEq/L      Chloride 105 mEq/L      CO2 23 mEq/L      Calcium 9.9 mg/dL      Protein, Total 7.2 g/dL      Albumin 4.3 g/dL      AST (SGOT) 20 U/L      ALT 12 U/L      Alkaline Phosphatase 86 U/L      Bilirubin, Total 1.0 mg/dL      Globulin 2.9 g/dL      Albumin/Globulin Ratio 1.5     Anion Gap 13.0     eGFR >60.0 mL/min/1.73 m2     Lipase [[id number]] Collected: 10/18/21 0803     Updated: 10/18/21 0851     Lipase 10 U/L     CBC and differential [[id number]] Collected: 10/18/21 0803    Specimen: Blood Updated: 10/18/21 0834     WBC 7.28 x10 3/uL      Hgb 12.7 g/dL      Hematocrit 88.4 %      Platelets 224 x10 3/uL      RBC 4.24 x10 6/uL      MCV 88.4 fL      MCH 30.0 pg      MCHC 33.9 g/dL      RDW 13 %       MPV 11.4 fL      Instrument Absolute Neutrophil Count 6.24 x10 3/uL      Neutrophils 85.8 %      Lymphocytes Automated 8.1 %      Monocytes 5.2 %      Eosinophils Automated 0.0 %      Basophils Automated 0.8 %      Immature Granulocytes 0.1 %      Nucleated RBC 0.0 /100 WBC      Neutrophils Absolute 6.24 x10 3/uL      Lymphocytes Absolute Automated 0.59 x10 3/uL      Monocytes Absolute Automated 0.38 x10 3/uL      Eosinophils Absolute Automated 0.00 x10 3/uL      Basophils Absolute Automated 0.06 x10 3/uL      Immature Granulocytes Absolute 0.01 x10  3/uL      Absolute NRBC 0.00 x10 3/uL             Rads:   Radiological Procedure reviewed.    Signed by: Pilar Plate, NP

## 2021-10-19 ENCOUNTER — Other Ambulatory Visit: Payer: Self-pay

## 2021-10-19 LAB — BASIC METABOLIC PANEL
Anion Gap: 6 (ref 5.0–15.0)
BUN: 9 mg/dL (ref 7.0–21.0)
CO2: 26 mEq/L (ref 17–29)
Calcium: 8.7 mg/dL (ref 8.5–10.5)
Chloride: 110 mEq/L (ref 99–111)
Creatinine: 0.8 mg/dL (ref 0.4–1.0)
Glucose: 124 mg/dL — ABNORMAL HIGH (ref 70–100)
Potassium: 3.2 mEq/L — ABNORMAL LOW (ref 3.5–5.3)
Sodium: 142 mEq/L (ref 135–145)
eGFR: >60 mL/min/{1.73_m2} (ref >=60)

## 2021-10-19 LAB — CBC
Absolute NRBC: 0 10*3/uL (ref 0.00–0.00)
Hematocrit: 31.6 % — ABNORMAL LOW (ref 34.7–43.7)
Hgb: 10.7 g/dL — ABNORMAL LOW (ref 11.4–14.8)
MCH: 30 pg (ref 25.1–33.5)
MCHC: 33.9 g/dL (ref 31.5–35.8)
MCV: 88.5 fL (ref 78.0–96.0)
MPV: 11.4 fL (ref 8.9–12.5)
Nucleated RBC: 0 /100 WBC (ref 0.0–0.0)
Platelets: 189 10*3/uL (ref 142–346)
RBC: 3.57 10*6/uL — ABNORMAL LOW (ref 3.90–5.10)
RDW: 13 % (ref 11–15)
WBC: 4.85 10*3/uL (ref 3.10–9.50)

## 2021-10-19 LAB — MAGNESIUM: Magnesium: 2 mg/dL (ref 1.6–2.6)

## 2021-10-19 MED ORDER — POTASSIUM CHLORIDE 10 MEQ/100ML IV SOLN (WRAP)
10.0000 meq | INTRAVENOUS | Status: AC
Start: 2021-10-19 — End: 2021-10-19
  Administered 2021-10-19 (×3): 10 meq via INTRAVENOUS
  Filled 2021-10-19 (×2): qty 100

## 2021-10-19 MED ORDER — POTASSIUM CHLORIDE 2 MEQ/ML IV SOLN
INTRAVENOUS | Status: DC
Start: 2021-10-19 — End: 2021-10-20
  Filled 2021-10-19 (×2): qty 1000

## 2021-10-19 NOTE — Progress Notes (Signed)
Patient seen and examined  Reviewed NP notes  Agree with documentation    Assessment  Partial SBO/SBO  Appears to have resolved    Plan:  NG tube discontinued  Clear liquid diet  If patient continues to improve  Will advance diet and discharge surgery plans for discharge  Discussed with surgery team as well as patient and son at bedside

## 2021-10-19 NOTE — Progress Notes (Signed)
Initial Case Management Assessment and Discharge Planning  [facility]   Patient Name: Aimee Watson, Aimee Watson   Date of Birth [birth year]   Attending Physician: Gwyndolyn Kaufman, MD   Primary Care Physician: Pcp, None, MD   Length of Stay 1   Reason for Consult / Chief Complaint IDPA        Situation   Admission DX:   1. Small bowel obstruction    2. SBO (small bowel obstruction)        A/O Status: X 3    LACE Score: 4    Patient admitted from: ER  Admission Status: inpatient     Background     Advanced directive:   <no information>        Code Status:   Full Code     Residence: Multi-story home    PCP: PCP None, MD  Patient Contact:   [phone] (home)     There is no such number on file (mobile).     Emergency contact:   Extended Emergency Contact Information  Primary Emergency Contact: Spickler,Michael  Mobile Phone: [phone]  Relation: Son  Preferred language: English  Interpreter needed? No  Secondary Emergency Contact: Wood,Megan  Mobile Phone: [phone]  Relation: Daughter in law  Preferred language: English  Interpreter needed? No      ADL/IADL's: Independent  Previous Level of function: 7 Independent     DME: None    Pharmacy:     [facility] [phone] [address], [place] - [phone] AT [facility] [address]  [address] [place] [address]  Phone: [phone] Fax: [phone]      Prescription Coverage: Yes    Home Health: The patient is not currently receiving home health services.    Previous SNF/AR: Unknown    COVID Vaccine Status: Fully vaccinated with 1 booster    Date First IMM given: Unknown  UAI on file?: No  Transport for discharge? Mode of transportation: Sales executive - Family/Friend to drive patient  Agreeable to Home with family post-discharge:  Yes     Assessment   CM met and spoke with patient at bedside; Verified Face Sheet. Pt lives with her  son in a multilevel house; Self care; Independent. Pt has Hx of HH use.  Son to provide  transportation home upon D/C. NGT was removed today.   BARRIERS TO DISCHARGE: N/A      Recommendation   D/C Plan A: Home with family    D/C Plan B: Home with home health    D/C Plan C:       Natasha Bence, RN, BSN, CM  RN Care Manager I  [facility]  [phone]

## 2021-10-19 NOTE — Plan of Care (Signed)
NURSING SHIFT NOTE     Patient: Aimee Watson  Day: 1      SHIFT EVENTS     Shift Narrative/Significant Events (PRN med administration, fall, RRT, etc.):   Pt on NG tube low continuous suction. Pt denies any N/V and pain.    Safety and fall precautions remain in place. Purposeful rounding completed.          ASSESSMENT     Changes in assessment from patient's baseline this shift:    Neuro: No  CV: No  Pulm: No  Peripheral Vascular: No  HEENT: No  GI: No  BM during shift: No, Last BM: Last BM Date: 10/18/21  GU: No   Integ: No  MS: No    Pain: None  Pain Interventions: None  Medications Utilized: none    Mobility: PMP Activity: Step 6 - Walks in Room of Distance Walked (ft) (Step 6,7): 50 Feet           Lines     Patient Lines/Drains/Airways Status       Active Lines, Drains and Airways       Name Placement date Placement time Site Days    Peripheral IV 10/18/21 20 G Left Antecubital 10/18/21  0802  Antecubital  less than 1    NG/OG Tube Nasogastric 16 Fr. Right nostril 10/18/21  1205  Right nostril  less than 1                         VITAL SIGNS     Vitals:    10/19/21 0343   BP: 132/65   Pulse: 71   Resp: 16   Temp: 98.1 F (36.7 C)   SpO2: 98%       Temp  Min: 97.9 F (36.6 C)  Max: 99 F (37.2 C)  Pulse  Min: 69  Max: 84  Resp  Min: 14  Max: 18  BP  Min: 119/68  Max: 148/64  SpO2  Min: 98 %  Max: 100 %    No intake or output data in the 24 hours ending 10/19/21 0510       CARE PLAN        Problem: Safety  Goal: Patient will be free from injury during hospitalization  Outcome: Progressing  Flowsheets (Taken 10/18/2021 1821 by Marina GoodellLewis, Keiana, RN)  Patient will be free from injury during hospitalization:   Assess patient's risk for falls and implement fall prevention plan of care per policy   Provide and maintain safe environment   Use appropriate transfer methods   Ensure appropriate safety devices are available at the bedside   Include patient/ family/ care giver in decisions related to safety   Hourly  rounding  Goal: Patient will be free from infection during hospitalization  Outcome: Progressing  Flowsheets (Taken 10/18/2021 1821 by Marina GoodellLewis, Ayane, RN)  Free from Infection during hospitalization:   Assess and monitor for signs and symptoms of infection   Monitor all insertion sites (i.e. indwelling lines, tubes, urinary catheters, and drains)   Encourage patient and family to use good hand hygiene technique   Monitor lab/diagnostic results     Problem: Pain  Goal: Pain at adequate level as identified by patient  Outcome: Progressing  Flowsheets (Taken 10/18/2021 1821 by Marina GoodellLewis, Morgin, RN)  Pain at adequate level as identified by patient: Assess pain on admission, during daily assessment and/or before any "as needed" intervention(s)     Problem: Side Effects from  Pain Analgesia  Goal: Patient will experience minimal side effects of analgesic therapy  Outcome: Progressing  Flowsheets (Taken 10/19/2021 0509)  Patient will experience minimal side effects of analgesic therapy:   Monitor/assess patient's respiratory status (RR depth, effort, breath sounds)   Assess for changes in cognitive function

## 2021-10-19 NOTE — Plan of Care (Signed)
NURSING SHIFT NOTE     Patient: Aimee Watson  Day: 1      SHIFT EVENTS     Shift Narrative/Significant Events (PRN med administration, fall, RRT, etc.):     Pt AXOX4. Denies any pain. Pt ambulatory in the room and in the hall way. Skin dry and intact. NG-tube taken out as ordered, the out put was 200ml. Pt stated that had small bowel movement and passing Gass. Pt on clear liquid diet.     Safety and fall precautions remain in place. Purposeful rounding completed.          ASSESSMENT     Changes in assessment from patient's baseline this shift:    Neuro: No  CV: No  Pulm: No  Peripheral Vascular: No  HEENT: No  GI: No  BM during shift: No, Last BM: 10/19/21  GU: No   Integ: No  MS: No    Pain: None  Pain Interventions: Positioning  Medications Utilized: none given at this time.    Mobility: PMP Activity: Step 6 - Walks in Room of Distance Walked (ft) (Step 6,7): 50 Feet           Lines     Patient Lines/Drains/Airways Status       Active Lines, Drains and Airways       Name Placement date Placement time Site Days    Peripheral IV 10/18/21 20 G Left Antecubital 10/18/21  0802  Antecubital  1    NG/OG Tube Nasogastric 16 Fr. Right nostril 10/18/21  1205  Right nostril  1                         VITAL SIGNS     Vitals:    10/19/21 1305   BP: 126/62   Pulse: 77   Resp: 17   Temp: 99 F (37.2 C)   SpO2: 100%       Temp  Min: 98.1 F (36.7 C)  Max: 99 F (37.2 C)  Pulse  Min: 69  Max: 77  Resp  Min: 14  Max: 18  BP  Min: 116/67  Max: 143/64  SpO2  Min: 98 %  Max: 100 %      Intake/Output Summary (Last 24 hours) at 10/19/2021 1337  Last data filed at 10/18/2021 2200  Gross per 24 hour   Intake 60 ml   Output 100 ml   Net -40 ml              CARE PLAN        Problem: Pain  Goal: Pain at adequate level as identified by patient  Outcome: Progressing     Problem: Altered GI Function  Goal: Fluid and electrolyte balance are achieved/maintained  Outcome: Progressing  Flowsheets (Taken 10/19/2021 1335)  Fluid and electrolyte  balance are achieved/maintained:   Monitor intake and output every shift   Provide adequate hydration   Monitor/assess lab values and report abnormal values   Assess for confusion/personality changes   Assess and reassess fluid and electrolyte status   Monitor for muscle weakness   Observe for cardiac arrhythmias  Goal: Elimination patterns are normal or improving  Outcome: Progressing  Flowsheets (Taken 10/19/2021 1335)  Elimination patterns are normal or improving:   Assess for normal bowel sounds   Monitor for abdominal distension   Monitor for abdominal discomfort  Goal: Nutritional intake is adequate  Outcome: Progressing  Flowsheets (Taken 10/19/2021 1335)  Nutritional intake  is adequate:   Monitor daily weights   Encourage/perform oral hygiene as appropriate   Allow adequate time for meals   Encourage/administer dietary supplements as ordered (i.e. tube feed, TPN, oral, OGT/NGT, supplements)  Goal: Mobility/Activity is maintained at optimal level for patient  Outcome: Progressing  Flowsheets (Taken 10/19/2021 1335)  Mobility/activity is maintained at optimal level for patient:   Increase mobility as tolerated/progressive mobility   Encourage independent activity per ability   Maintain proper body alignment   Plan activities to conserve energy, plan rest periods  Goal: No bleeding  Outcome: Progressing  Flowsheets (Taken 10/19/2021 1335)  No bleeding:   Monitor and assess vitals and hemodynamic parameters   Assess for bruising/petechia   Monitor/assess lab values and report abnormal values     Problem: Altered GI Function  Goal: Fluid and electrolyte balance are achieved/maintained  Outcome: Progressing  Flowsheets (Taken 10/19/2021 1335)  Fluid and electrolyte balance are achieved/maintained:   Monitor intake and output every shift   Provide adequate hydration   Monitor/assess lab values and report abnormal values   Assess for confusion/personality changes   Assess and reassess fluid and electrolyte status    Monitor for muscle weakness   Observe for cardiac arrhythmias     Problem: Altered GI Function  Goal: Elimination patterns are normal or improving  Outcome: Progressing  Flowsheets (Taken 10/19/2021 1335)  Elimination patterns are normal or improving:   Assess for normal bowel sounds   Monitor for abdominal distension   Monitor for abdominal discomfort     Problem: Altered GI Function  Goal: Nutritional intake is adequate  Outcome: Progressing  Flowsheets (Taken 10/19/2021 1335)  Nutritional intake is adequate:   Monitor daily weights   Encourage/perform oral hygiene as appropriate   Allow adequate time for meals   Encourage/administer dietary supplements as ordered (i.e. tube feed, TPN, oral, OGT/NGT, supplements)     Problem: Altered GI Function  Goal: Mobility/Activity is maintained at optimal level for patient  Outcome: Progressing  Flowsheets (Taken 10/19/2021 1335)  Mobility/activity is maintained at optimal level for patient:   Increase mobility as tolerated/progressive mobility   Encourage independent activity per ability   Maintain proper body alignment   Plan activities to conserve energy, plan rest periods     Problem: Altered GI Function  Goal: No bleeding  Outcome: Progressing  Flowsheets (Taken 10/19/2021 1335)  No bleeding:   Monitor and assess vitals and hemodynamic parameters   Assess for bruising/petechia   Monitor/assess lab values and report abnormal values

## 2021-10-19 NOTE — Progress Notes (Signed)
GENERAL SURGERY PROGRESS NOTE  [facility] [phone]    Date Time: 10/19/21 8:16 AM  Patient Name: [facility] Day: 1    ASSESSMENT:     Patient Active Problem List   Diagnosis    SBO (small bowel obstruction)       PLAN:   - Given ROBF this AM, d/c'd NGT  - CLD  - PRN antiemetics  - Multimodal pain control  - Encourage IS, ambulation  - Rest of care per primary team  - Call with questions/concerns     Orland Mustard, MD  General Surgery, PGY-3  [phone]    Attending Note:    Doing ok. Pain controlled. Passing flatus but no BM. On exam, abdomen is soft, ND, with minimal tenderness in right side, no rebound/guarding.  - NGT Accomac'd  - Cleras ordered  - Rest as per Primary    I, Marlaine Hind, MD, have personally seen and examined this patient and have participated in their care. I agree with all clinical information, including the physical exam, patient history, and planning as documented by the physician assistant/resident. All notes, labs, and imaging were reviewed.  The patient was seen on the date the note was written      Marlaine Hind, MD  [facility]  [phone]     SUBJECTIVE:   NAEON.    Patient resting comfortably this AM. Pain well-controlled. Denies nausea or vomiting around NGT. Endorses flatus but denies BM overnight. No additional questions or concerns this AM.     Objective:   Current Vitals:   Vitals:    10/19/21 0343   BP: 132/65   Pulse: 71   Resp: 16   Temp: 98.1 F (36.7 C)   SpO2: 98%       Intake and Output Summary (Last 24 hours):  I/O last 3 completed shifts:  In: 60 [NG/GT:60]  Out: 100 [Emesis/NG output:100]    Labs:     Results       Procedure Component Value Units Date/Time    Magnesium [[id number]] Collected: 10/19/21 0354    Specimen: Blood Updated: 10/19/21 0810     Magnesium 2.0 mg/dL     Basic Metabolic Panel [[id number]]  (Abnormal) Collected: 10/19/21 0354    Specimen: Blood Updated: 10/19/21 0451     Glucose 124 mg/dL      BUN 9.0 mg/dL       Creatinine 0.8 mg/dL      Calcium 8.7 mg/dL      Sodium 952 mEq/L      Potassium 3.2 mEq/L      Chloride 110 mEq/L      CO2 26 mEq/L      Anion Gap 6.0     eGFR >60.0 mL/min/1.73 m2     CBC without differential [[id number]]  (Abnormal) Collected: 10/19/21 0354    Specimen: Blood Updated: 10/19/21 0431     WBC 4.85 x10 3/uL      Hgb 10.7 g/dL      Hematocrit 02.7 %      Platelets 189 x10 3/uL      RBC 3.57 x10 6/uL      MCV 88.5 fL      MCH 30.0 pg      MCHC 33.9 g/dL      RDW 13 %      MPV 11.4 fL      Nucleated RBC 0.0 /100 WBC      Absolute NRBC 0.00 x10 3/uL  High Sensitivity Troponin-I at 2 hrs with calculated Delta [[id number]] Collected: 10/18/21 1615    Specimen: Blood Updated: 10/18/21 1707     hs Troponin-I <2.7 ng/L      hs Troponin-I Delta calc n/a ng/L     Urine culture [[id number]] Collected: 10/18/21 0943    Specimen: Urine, Clean Catch Updated: 10/18/21 1622    Narrative:      Indications for Urine Culture:->Suprapubic Pain/Tenderness or  Dysuria    High Sensitivity Troponin-I at 0 hrs [[id number]] Collected: 10/18/21 0803    Specimen: Blood Updated: 10/18/21 1516     hs Troponin-I <2.7 ng/L     TSH [[id number]] Collected: 10/18/21 0803    Specimen: Blood Updated: 10/18/21 1514     TSH 1.23 uIU/mL     Lactic Acid [[id number]] Collected: 10/18/21 1119    Specimen: Blood Updated: 10/18/21 1126     Lactic Acid 1.2 mmol/L     Urinalysis Reflex to Microscopic Exam- Reflex to Culture [[id number]]  (Abnormal) Collected: 10/18/21 0943     Updated: 10/18/21 1014     Urine Type Urine, Clean Ca     Color, UA Straw     Clarity, UA Clear     Specific Gravity UA 1.011     Urine pH 6.0     Leukocyte Esterase, UA Trace     Nitrite, UA Negative     Protein, UR Negative     Glucose, UA Negative     Ketones UA Trace     Urobilinogen, UA Negative mg/dL      Bilirubin, UA Negative     Blood, UA Negative     RBC, UA 11 - 25 /hpf      WBC, UA 6 - 10 /hpf      Urine Mucus Present    Magnesium [[id number]] Collected: 10/18/21 0803     Specimen: Blood Updated: 10/18/21 0949     Magnesium 2.1 mg/dL     Comprehensive metabolic panel [[id number]]  (Abnormal) Collected: 10/18/21 0803    Specimen: Blood Updated: 10/18/21 0851     Glucose 160 mg/dL      BUN 75.6 mg/dL      Creatinine 0.9 mg/dL      Sodium 433 mEq/L      Potassium 3.3 mEq/L      Chloride 105 mEq/L      CO2 23 mEq/L      Calcium 9.9 mg/dL      Protein, Total 7.2 g/dL      Albumin 4.3 g/dL      AST (SGOT) 20 U/L      ALT 12 U/L      Alkaline Phosphatase 86 U/L      Bilirubin, Total 1.0 mg/dL      Globulin 2.9 g/dL      Albumin/Globulin Ratio 1.5     Anion Gap 13.0     eGFR >60.0 mL/min/1.73 m2     Lipase [[id number]] Collected: 10/18/21 0803     Updated: 10/18/21 0851     Lipase 10 U/L     CBC and differential [[id number]] Collected: 10/18/21 0803    Specimen: Blood Updated: 10/18/21 0834     WBC 7.28 x10 3/uL      Hgb 12.7 g/dL      Hematocrit 09.3 %      Platelets 224 x10 3/uL      RBC 4.24 x10 6/uL      MCV 88.4 fL      MCH 30.0 pg  MCHC 33.9 g/dL      RDW 13 %      MPV 11.4 fL      Instrument Absolute Neutrophil Count 6.24 x10 3/uL      Neutrophils 85.8 %      Lymphocytes Automated 8.1 %      Monocytes 5.2 %      Eosinophils Automated 0.0 %      Basophils Automated 0.8 %      Immature Granulocytes 0.1 %      Nucleated RBC 0.0 /100 WBC      Neutrophils Absolute 6.24 x10 3/uL      Lymphocytes Absolute Automated 0.59 x10 3/uL      Monocytes Absolute Automated 0.38 x10 3/uL      Eosinophils Absolute Automated 0.00 x10 3/uL      Basophils Absolute Automated 0.06 x10 3/uL      Immature Granulocytes Absolute 0.01 x10 3/uL      Absolute NRBC 0.00 x10 3/uL             Rads:     Radiology Results (24 Hour)       Procedure Component Value Units Date/Time    Abdomen 2 View With Chest 1 View [[id number]] Collected: 10/18/21 1257    Order Status: Completed Updated: 10/18/21 1301    Narrative:      ACUTE ABDOMINAL SERIES: Frontal chest, upright and supine abdomen.    CLINICAL STATEMENT: Bowel  obstruction.     COMPARISON: CT of the abdomen and pelvis performed on 10/18/2021.     FINDINGS: An NG tube is present, its tip ends overlying the body the  stomach.    The lungs lungs are clear. The cardiomediastinal silhouette is unremarkable  in appearance. No free air is identified under the diaphragm.     Mildly dilated loops of small bowel are identified of the mid abdomen. The  large bowel is decompressed containing air and stool.    Contrast is identified within the renal collecting systems, ureters and  bladder, excreted following the recent CT. A 2 cm calculus is identified  overlying the gallbladder fossa. No significant osseous abnormalities can  be identified.      Impression:        Mildly dilated loops of small bowel throughout the mid abdomen.     Fonnie Mu, DO  10/18/2021 12:59 PM    CT Abd/Pelvis with IV Contrast [[id number]] Collected: 10/18/21 1048    Order Status: Completed Updated: 10/18/21 1105    Narrative:      CT ABDOMEN PELVIS W IV/ WO PO CONT: 10/18/2021 10:36 AM    CLINICAL INFORMATION  abdominal pain, worse around site of ileostomy reversal, hx of ileus and  rectal cancer, vomiting and diarrhea.    COMPARISON  None.    TECHNIQUE   Helical axial imaging from above the domes of diaphragm through the pubic  symphysis following administration of 100 cc of Omnipaque 350 intravenously  and without intraluminal bowel contrast. Sagittal and coronal  reconstructions were obtained.     Note that CT scanning at this site utilizes multiple dose reduction  techniques including automatic exposure control, adjustment of the MAS  and/or KVP according to patient size, and use of iterative reconstruction  technique.    FINDINGS  Lung base: No evidence of pulmonary parenchymal consolidation.    Liver: Unremarkable.  Gallbladder and biliary tree: The gallbladder is mildly distended and  contains a peripherally calcified gallstone. No abnormal gallbladder wall  thickening or pericholecystic  fluid.  Pancreas: Diffuse pancreatic atrophy.  Spleen: Normal.  Adrenals: Normal.  Kidneys and ureters: Normal corticomedullary differentiation without  evidence of hydronephrosis.    Stomach: The stomach is predominantly decompressed but otherwise  unremarkable.  Small bowel: Proximal small bowel is normal in caliber. There is moderate  fluid-filled distention of the mid to distal small bowel, with transition  point to collapsed loops of small bowel within the right lower quadrant,  near the site of the ileostomy reversal. There is small bowel fecalization  within the distended small bowel loops.   Large bowel: The colon is predominantly fluid and stool filled, extending  to the rectum.  Appendix: Not visualized.    Lymph nodes: No pathologically enlarged lymph nodes.    Vessels: Hepatic and mesenteric vasculature are patent.    Peritoneum: No ascites or free air. No other fluid collection.  Retroperitoneum: Unremarkable.  Abdominal wall: Soft tissue stranding noted within the right upper quadrant  likely representing site of previous ileostomy.    Bladder: Partially decompressed but otherwise unremarkable.  Reproductive organs: The uterus is surgical absent.  The bilateral adnexa  are unremarkable.    Osseous structures: Minimal lumbar degenerative changes. No suspicious  lytic or sclerotic osseous lesions.      Impression:          1. Findings consistent with high-grade partial versus early complete small  bowel obstruction, with transition point within the right lower quadrant  near the ileostomy reversal site.  2. Cholelithiasis without CT evidence of acute cholecystitis.        Casimiro Needle Doxey  10/18/2021 11:03 AM              Medications:     Current Facility-Administered Medications   Medication Dose Route Frequency    ciprofloxacin  400 mg Intravenous Q12H    famotidine  20 mg Oral Q12H SCH    Or    famotidine  20 mg Intravenous Q12H SCH    heparin (porcine)  5,000 Units Subcutaneous Q8H SCH    potassium  chloride  10 mEq Intravenous Q1H      dextrose 5 % and 0.45% NaCl 100 mL/hr at 10/19/21 0335     acetaminophen **OR** acetaminophen, bisacodyl, dextrose **OR** dextrose **OR** dextrose **OR** glucagon (rDNA), ketorolac, magnesium sulfate, melatonin, morphine, naloxone, ondansetron **OR** ondansetron, phenol, potassium & sodium phosphates, potassium chloride **AND** potassium chloride     Physical Exam:   General: Normal appearing. Alert and oriented, in no acute distress.  Head: Normocephalic.   HEENT: No scleral icterus. Extraocular motions intact. Normal hearing.  Neck: Normal range of motion.  Pulm: Non-labored breathing. Normal chest rise. No acute respiratory distress.  CV: Clinically well-perfused.  Abd: Soft, non-distended. Right sided and upper abdominal TTP. Prior incisions well-healed. No masses or organomegaly noted.  MSK: No obvious swelling or deformities.  Neuro: Alert and oriented. Motor and sensory grossly intact.  Psych: Appropriate mood, affect, and thought.      Signed by: Orland Mustard, MD

## 2021-10-19 NOTE — Progress Notes (Signed)
[facility]  PROGRESS NOTE      Patient: Aimee Watson  Date: 10/19/2021   LOS: 1 Days  Admission Date: 10/18/2021   MRN: [id number]  Attending: Pilar Plate, NP  Please contact me on the following Spectralink  Via Epic chart       ASSESSMENT/PLAN     Aimee Watson is a 66 y.o. female admitted with SBO (small bowel obstruction)    Interval Summary:   66 year old female with history of rectal cancer status post chemo/radiation with ileostomy in 2022 reversal in February 2023, prior small bowel obstruction medically treated, admitted for small bowel obstruction.  General surgery is on board with plan for conservative treatment with NG tube for decompression and IV fluids.  Patient is n.p.o.  Plan for Gastrografin study in a.m. if SBO is not resolved.    Patient Active Hospital Problem List:  SBO (small bowel obstruction) (10/18/2021)     General surgery is on board, input appreciated, recommendations for NG tube for decompression and conservative management  -Continue IV fluid hydration  -Keep patient n.p.o.  -Continue NG tube to wall suction  -Patient will have Gastrografin challenge tomorrow if obstruction does not resolve per general surgery  -Continue pain management  -Nausea management  -Further recommendations per general surgery    Hypokalemia  -Replace potassium with 3K riders  -Repeat potassium level in a.m.    Urinary frequency and urgency  -UA with positive WBC and mild leuks  -Urine cultures pending   -Continue IV Cipro and deescalate based on Ucx    Normocytic anemia  -Likely due to chronic disease versus hydration  -No evidence of bleeding  -Follow CBC and transfuse with hemoglobin less than 7      Analgesia:    Tylenol for mild pain, Toradol for moderate pain and morphine for severe pain  Nutrition:    NPO  GI Prophylaxis:    Pepcid  DVT Prophylaxis:   Chemical PPx to be initiated once cleared by GI     Code Status:  Full code    DISPO:  Home likely in 24-48 hrs post Gastrografin study  and ability to tolerate p.o. intake.    Family Contact: Patient has 2 sons and she is staying with 1  Case reviewed and discussed with attending physician Dr. Lestine Mount.       SUBJECTIVE     Aimee Watson states she feels much better today, passing gas, mild tenderness to felt more like gas movements, no nausea, no fever.  Had 4 small bowel movements overnight, no further complaints.    MEDICATIONS     Current Facility-Administered Medications   Medication Dose Route Frequency    ciprofloxacin  400 mg Intravenous Q12H    famotidine  20 mg Oral Q12H SCH    Or    famotidine  20 mg Intravenous Q12H SCH    heparin (porcine)  5,000 Units Subcutaneous Q8H SCH    potassium chloride  10 mEq Intravenous Q1H       PHYSICAL EXAM     Vitals:    10/19/21 0821   BP: 116/67   Pulse: 72   Resp: 16   Temp: 98.1 F (36.7 C)   SpO2: 99%       Temperature: Temp  Min: 98.1 F (36.7 C)  Max: 99 F (37.2 C)  Pulse: Pulse  Min: 69  Max: 80  Respiratory: Resp  Min: 14  Max: 18  Non-Invasive BP: BP  Min: 116/67  Max:  143/64  Pulse Oximetry SpO2  Min: 98 %  Max: 100 %    Intake and Output Summary (Last 24 hours) at Date Time    Intake/Output Summary (Last 24 hours) at 10/19/2021 0854  Last data filed at 10/18/2021 2200  Gross per 24 hour   Intake 60 ml   Output 100 ml   Net -40 ml     GEN APPEARANCE: Normal affect, chronically ill appearing, in NAD;  A&OX3  HEENT: PERLA; EOMI; Conjunctiva Clear, NGT intact to wall suction  NECK: Supple; No bruits  CVS: RRR, S1, S2; No M/G/R  LUNGS: CTAB; No Wheezes; No Rhonchi: No rales  ABD: Soft; mild TTP; + Normoactive BS, +gas and small BMs  EXT: No edema; Pulses 2+ and intact  SKIN: No rash or Lesions  NEURO: CN 2-12 intact; No Focal neurological deficits    LABS     Recent Labs   Lab 10/19/21  0354 10/18/21  0803   WBC 4.85 7.28   RBC 3.57* 4.24   Hgb 10.7* 12.7   Hematocrit 31.6* 37.5   MCV 88.5 88.4   Platelets 189 224       Recent Labs   Lab 10/19/21  0354 10/18/21  0803   Sodium 142 141    Potassium 3.2* 3.3*   Chloride 110 105   CO2 26 23   BUN 9.0 15.0   Creatinine 0.8 0.9   Glucose 124* 160*   Calcium 8.7 9.9   Magnesium 2.0 2.1       Recent Labs   Lab 10/18/21  0803   ALT 12   AST (SGOT) 20   Bilirubin, Total 1.0   Albumin 4.3   Alkaline Phosphatase 86       Recent Labs   Lab 10/18/21  1615 10/18/21  0803   hs Troponin-I <2.7 <2.7   hs Troponin-I Delta calc n/a  --              Microbiology Results (last 15 days)       Procedure Component Value Units Date/Time    Urine culture [[id number]] Collected: 10/18/21 0943    Order Status: Sent Specimen: Urine, Clean Catch Updated: 10/18/21 1622    Narrative:      Indications for Urine Culture:->Suprapubic Pain/Tenderness or  Dysuria    Stool for Salm/Shig/Campy/Shiga PCR [[id number]]     Order Status: Canceled Specimen: Stool     Clostridium difficile toxin B PCR [[id number]]     Order Status: Canceled Specimen: Stool     Stool Enteric Viral Panel PCR [[id number]]     Order Status: Canceled Specimen: Stool     Stool Cryptosporidium / Giardia Antigen [[id number]]     Order Status: Canceled Specimen: Stool              RADIOLOGY     Upon my review:    SignedPilar Plate, NP  8:54 AM 10/19/2021     This chart was generated using a medical voice-recognition software which does not employ spell-checking or grammar-checking features. It was dictated, all or in part, in a busy and often noisy patient care environment. I have taken all usual measures to dictate carefully and to review all aspects of this chart. Nonetheless, given the known and well-documented performance characteristics of voice recognition software in such patient care environments, this dictation still may contain unrecognized and wholly unintended errors or omissions.

## 2021-10-19 NOTE — UM Notes (Signed)
Admission Order:  10/18/21 1200  ADMIT TO INPATIENT  Once        Diagnosis: Sbo (Small Bowel Obstruction)   Level of Care: Acute   Patient Class: Inpatient           Primary Payor: Payor: MEDICARE / Plan: MEDICARE PART A AND B / Product Type: Medicare /      Patient Information:  Patient Name: Aimee Watson       DOB: 06/16/1955  MRN: [id number]    Admission Diagnosis: Small bowel obstruction [K56.609]  SBO (small bowel obstruction) [K56.609]   Facility: [facility]     Initial Admission Clinical    H&P: 66 y.o. female with a past medical history of rectal cancer diagnosed in December 2020 status post chemo and radiation with ileostomy, she had reversal of her stoma in February 2023 presented to the emergency room with constant generalized abdominal pain described as cramping sensation, worse in the upper quadrant, associated with recurrent nonbilious and nonbloody nausea, vomiting, chills and 1 episode of non-bloody diarrhea starting yesterday at 9 PM after eating a large meal. She also noticed increased urinary frequency, foaming and urgency for the past 3 days and mild lightheadedness as well. All symptoms current resolved.     She reports similar symptoms and episode of small bowel obstruction after her ostomy reversal in February 2023 which was treated medically with NG tube and NPO. She denies fever, blood in stool or vomitus, hematuria, chest pain, shortness of breath, sick contacts.  She reports intermittent loose stools since 20% of her rectum was surgically removed and was told it expected for least a year.     The emergency room, hemoglobin was reviewed including CT abdomen pelvis with contrast with findings consistent with small bowel obstruction.  General surgery Dr. Ramon Dredge will consulted who recommended NG tube and will see patient today.  Labs showed potassium of 3.3 repleted in the ED.         Past Medical History:   Diagnosis Date    Rectal adenocarcinoma      Past  Surgical History:   Procedure Laterality Date    ROBOTIC, COLECTOMY, LAR  10/21/2020    TAKEDOWN ILEOSTOMY  03/24/2021       Assessment and Plan:  # Small bowel obstruction noted on CT imaging  #History of rectal cancer status post chemo/radiation with ileostomy in 2022 and reversal in February 2023  #History of small bowel obstruction in February 2023  General surgery consulted by the emergency room physician who recommended NG tube to continuous suction  We will admit patient to medical floor inpatient under sound physician  -Patient's pain control with 15 mg of IV Toradol, she received a liter of IV fluids and 4 mg of IV Zofran for nausea management.   -Continue Toradol 15 mg IV every 6 hours as needed for moderate pain and morphine 2 mg every 4 hours as needed for severe pain  -Continue IV Zofran 4 mg every 4 hours as needed for nausea management  -Keep patient n.p.o.  -Continue Chloraseptic for throat discomfort  -NG tube to wall suction  -KUB in a.m.  -Further recommendations per general surgery  -Follow labs and correct as needed     Hypokalemia  Replaced in the ED  We will repeat a BMP in a.m. for follow-up on potassium level and correct as needed     Lightheadedness with no associated chest pain, shortness of breath, palpitations  Likely due to  dehydration  Patient received 1 L IV fluid bolus symptoms admission  EKG showed normal sinus rhythm at 75 with nonspecific ST abnormality  We will get a troponin level and TSH  Follow labs     Urinary frequency and urgency  UA with positive WBC and mild leuks  Urine cultures pending  Will empirically start patient on IV ceftriaxone and de-escalate based on culture result      Clinical information:   ED VS:   Temp:  [98.1 F (36.7 C)-99 F (37.2 C)]   Heart Rate:  [69-80]   Resp Rate:  [14-18]   BP: (116-143)/(59-71)   SpO2:  [98 %-100 %]   Height:  [160 cm (5\' 3" )]   Weight:  [60.3 kg (133 lb)]   BMI (calculated):  [23.6]       ED Labs:   10/18/21 08:03 10/18/21  09:43 10/19/21 03:54   Hemoglobin 12.7  10.7 (L)   Hematocrit 37.5  31.6 (L)   RBC 4.24  3.57 (L)   Glucose 160 (H)  124 (H)   Potassium 3.3 (L)  3.2 (L)   Leukocyte Esterase, UA  Trace !    Ketones UA  Trace !    RBC UA  11 - 25 !    WBC, UA  6 - 10 !    (L): Data is abnormally low  (H): Data is abnormally high  !: Data is abnormal      Imaging:  Abdomen 2 View With Chest 1 View (Final result)  Result time 10/18/21 12:59:27  Final result by Bobette Mo, DO (10/18/21 12:59:27)                Impression:      Mildly dilated loops of small bowel throughout the mid abdomen.             CT Abd/Pelvis with IV Contrast (Final result)  Result time 10/18/21 11:03:15  Final result by Cristy Folks, MD (10/18/21 11:03:15)                Impression:        1. Findings consistent with high-grade partial versus early complete small   bowel obstruction, with transition point within the right lower quadrant   near the ileostomy reversal site.   2. Cholelithiasis without CT evidence of acute cholecystitis.               ED meds given:      Surgery progress note:  Impression:          Patient Active Problem List   Diagnosis    SBO (small bowel obstruction)      Diagnosis is SBO. Exam is fairly benign with mild tenderness and no rebound, labs without leukocytosis or lactic acidosis, vital signs not deranged.      Plan:      - Admit for NGT decompression and conservative management for now.  - NPO, IVF  - Gastrografin challenge tomorrow if not resolving yet    Hospital stay day 1  10/19/21     Medicine progress note:  SBO (small bowel obstruction) (10/18/2021)     General surgery is on board, input appreciated, recommendations for NG tube for decompression and conservative management  -Continue IV fluid hydration  -Keep patient n.p.o.  -Continue NG tube to wall suction  -Patient will have Gastrografin challenge tomorrow if obstruction does not resolve per general surgery  -Continue pain management  -Nausea management  -Further  recommendations per  general surgery     Hypokalemia  -Replace potassium with 3K riders  -Repeat potassium level in a.m.     Urinary frequency and urgency  -UA with positive WBC and mild leuks  -Urine cultures pending   -Continue IV Cipro and deescalate based on Ucx     Normocytic anemia  -Likely due to chronic disease versus hydration  -No evidence of bleeding  -Follow CBC and transfuse with hemoglobin less than 7            Scheduled Meds:  Current Facility-Administered Medications   Medication Dose Route Frequency    ciprofloxacin  400 mg Intravenous Q12H    famotidine  20 mg Oral Q12H SCH    Or    famotidine  20 mg Intravenous Q12H SCH    heparin (porcine)  5,000 Units Subcutaneous Q8H SCH    potassium chloride  10 mEq Intravenous Q1H     Continuous Infusions:   dextrose 5 % and 0.45% NaCl 100 mL/hr at 10/19/21 0335     PRN Meds:.acetaminophen **OR** acetaminophen, bisacodyl, dextrose **OR** dextrose **OR** dextrose **OR** glucagon (rDNA), ketorolac, magnesium sulfate, melatonin, morphine, naloxone, ondansetron **OR** ondansetron, phenol, potassium & sodium phosphates, potassium chloride **AND** potassium chloride      Intake/Output Summary (Last 24 hours) at 10/19/2021 1002  Last data filed at 10/18/2021 2200  Gross per 24 hour   Intake 60 ml   Output 100 ml   Net -40 ml          Patient Lines/Drains/Airways Status       Active Lines, Drains and Airways       Name Placement date Placement time Site Days    Peripheral IV 10/18/21 20 G Left Antecubital 10/18/21  0802  Antecubital  1    NG/OG Tube Nasogastric 16 Fr. Right nostril 10/18/21  1205  Right nostril  less than 1                       UTILIZATION REVIEW CONTACT: Earlene Plater, UR RN  Clinical case manager- Utilization review  Main UR#: [phone]  Direct #: [phone]  Email: [email or web address]    Facility Tax ID & NPI     Tax ID NPI   [facility] [id number]   Shea Stakes [id number]   [place] [id number]   [place]  [id number] - NEW;   [id number] - OLD;   Mackie Pai  [phone]       NOTES TO REVIEWER:     This clinical review is based on/compiled from documentation provided by the treatment team within the patient's medical record.

## 2021-10-20 DIAGNOSIS — K56609 Unspecified intestinal obstruction, unspecified as to partial versus complete obstruction: Secondary | ICD-10-CM

## 2021-10-20 LAB — BASIC METABOLIC PANEL
Anion Gap: 6 (ref 5.0–15.0)
BUN: 6 mg/dL — ABNORMAL LOW (ref 7.0–21.0)
CO2: 27 mEq/L (ref 17–29)
Calcium: 8.8 mg/dL (ref 8.5–10.5)
Chloride: 111 mEq/L (ref 99–111)
Creatinine: 0.8 mg/dL (ref 0.4–1.0)
Glucose: 107 mg/dL — ABNORMAL HIGH (ref 70–100)
Potassium: 3.7 mEq/L (ref 3.5–5.3)
Sodium: 144 mEq/L (ref 135–145)
eGFR: >60 mL/min/{1.73_m2} (ref >=60)

## 2021-10-20 LAB — ECG 12-LEAD
Atrial Rate: 75 {beats}/min
P Axis: 34 degrees
P-R Interval: 154 ms
Q-T Interval: 416 ms
QRS Duration: 70 ms
QTC Calculation (Bezet): 464 ms
R Axis: -28 degrees
T Axis: -10 degrees
Ventricular Rate: 75 {beats}/min

## 2021-10-20 LAB — CBC
Absolute NRBC: 0 10*3/uL (ref 0.00–0.00)
Hematocrit: 31.1 % — ABNORMAL LOW (ref 34.7–43.7)
Hgb: 10.4 g/dL — ABNORMAL LOW (ref 11.4–14.8)
MCH: 30.1 pg (ref 25.1–33.5)
MCHC: 33.4 g/dL (ref 31.5–35.8)
MCV: 90.1 fL (ref 78.0–96.0)
MPV: 11.5 fL (ref 8.9–12.5)
Nucleated RBC: 0 /100 WBC (ref 0.0–0.0)
Platelets: 152 10*3/uL (ref 142–346)
RBC: 3.45 10*6/uL — ABNORMAL LOW (ref 3.90–5.10)
RDW: 13 % (ref 11–15)
WBC: 3.19 10*3/uL (ref 3.10–9.50)

## 2021-10-20 LAB — MAGNESIUM: Magnesium: 2 mg/dL (ref 1.6–2.6)

## 2021-10-20 NOTE — Progress Notes (Signed)
GENERAL SURGERY PROGRESS NOTE  VSA [id number]    Date Time: 10/20/21 7:55 AM  Patient Name: Aimee Watson Day: 2    ASSESSMENT:   Aimee Watson is a 66 y.o. female who  has a past medical history of Rectal adenocarcinoma. Patient just moved here from [place] to be with her son and has history of T3N1 rectal cancer, sp neoadj folfox and subsequent LAR with diverting ileostomy 2022, reversal of stoma 2023 in February. Presented with SBO. NGT d/c'd on 9/11. ADAT.    Patient Active Problem List   Diagnosis    SBO (small bowel obstruction)     PLAN:   - Tolerating CLD; advance to regular diet  - PRN antiemetics  - Bowel reg w/ doc-senna, miralax  - Multimodal pain control  - Encourage IS, ambulation  - Appropriate for discharge from gen surg perspective after tolerating regular diet   - Rest of care per primary team  - Call with questions/concerns     Orland Mustard, MD  General Surgery, PGY-3  [phone]      This patient was personally seen and examined by me and I agree with the assessment and plan above.  All notes, labs, and imaging if performed were reviewed.  The patient was seen on the date the note was written.    Feeling much better. Passing loose stool today. Tolerated low residue diet.   OK for [place] home today     Maia Handa C. Artis Delay, MD, MPH  [facility]  [address] [place] [address]  [place], [place] [address]  [phone]         SUBJECTIVE:   Aimee Watson.    Patient resting comfortably this AM. Pain well-controlled. Tolerating CLD without nausea or vomiting. Endorses flatus but denies BM overnight. No additional questions or concerns this AM.     Objective:   Current Vitals:   Vitals:    10/20/21 0732   BP: 117/64   Pulse: 66   Resp: 18   Temp: 97.3 F (36.3 C)   SpO2: 99%       Intake and Output Summary (Last 24 hours):  I/O last 3 completed shifts:  In: 60 [NG/GT:60]  Out: 100 [Emesis/NG output:100]    Labs:     Results       Procedure Component Value Units Date/Time     Basic Metabolic Panel [[id number]]  (Abnormal) Collected: 10/20/21 0331    Specimen: Blood Updated: 10/20/21 0544     Glucose 107 mg/dL      BUN 6.0 mg/dL      Creatinine 0.8 mg/dL      Calcium 8.8 mg/dL      Sodium 696 mEq/L      Potassium 3.7 mEq/L      Chloride 111 mEq/L      CO2 27 mEq/L      Anion Gap 6.0     eGFR >60.0 mL/min/1.73 m2     Magnesium [[id number]] Collected: 10/20/21 0331     Updated: 10/20/21 0544     Magnesium 2.0 mg/dL     CBC without differential [[id number]]  (Abnormal) Collected: 10/20/21 0331    Specimen: Blood Updated: 10/20/21 0524     WBC 3.19 x10 3/uL      Hgb 10.4 g/dL      Hematocrit 36.6 %      Platelets 152 x10 3/uL      RBC 3.45 x10 6/uL      MCV 90.1 fL  MCH 30.1 pg      MCHC 33.4 g/dL      RDW 13 %      MPV 11.5 fL      Nucleated RBC 0.0 /100 WBC      Absolute NRBC 0.00 x10 3/uL     Magnesium [[id number]] Collected: 10/20/21 0331    Specimen: Blood Updated: 10/20/21 0332    Narrative:      Use  blood in the lab    Urine culture [[id number]] Collected: 10/18/21 0943    Specimen: Urine, Clean Catch Updated: 10/19/21 1649    Narrative:      Indications for Urine Culture:->Suprapubic Pain/Tenderness or  Dysuria  ORDER#: [id number]                                    ORDERED BY: MAKDISI, CHRIST  SOURCE: Urine, Clean Catch                           COLLECTED:  10/18/21 09:43  ANTIBIOTICS AT COLL.:                                RECEIVED :  10/18/21 16:21  Culture Urine                              FINAL       10/19/21 16:49  10/19/21   10,000 - 30,000 CFU/ML of normal urogenital or skin microbiota, no             further work      Magnesium [[id number]] Collected: 10/19/21 0354    Specimen: Blood Updated: 10/19/21 0810     Magnesium 2.0 mg/dL             Rads:     Radiology Results (24 Hour)       ** No results found for the last 24 hours. **              Medications:     Current Facility-Administered Medications   Medication Dose Route Frequency    famotidine  20 mg Oral Q12H SCH    Or     famotidine  20 mg Intravenous Q12H SCH    heparin (porcine)  5,000 Units Subcutaneous Q8H SCH      potassium chloride 40 mEq in dextrose 5 % and 0.9% NaCl 1,000 mL infusion 100 mL/hr at 10/19/21 2118     acetaminophen **OR** acetaminophen, bisacodyl, dextrose **OR** dextrose **OR** dextrose **OR** glucagon (rDNA), ketorolac, magnesium sulfate, melatonin, morphine, naloxone, ondansetron **OR** ondansetron, phenol, potassium & sodium phosphates, potassium chloride **AND** potassium chloride     Physical Exam:   General: Normal appearing. Alert and oriented, in no acute distress.  Head: Normocephalic.   HEENT: No scleral icterus. Extraocular motions intact. Normal hearing.  Neck: Normal range of motion.  Pulm: Non-labored breathing. Normal chest rise. No acute respiratory distress.  CV: Clinically well-perfused.  Abd: Soft, non-distended. Right sided and upper abdominal TTP. Prior incisions well-healed. No masses or organomegaly noted.  MSK: No obvious swelling or deformities.  Neuro: Alert and oriented. Motor and sensory grossly intact.  Psych: Appropriate mood, affect, and thought.      Signed by: Orland Mustard, MD

## 2021-10-20 NOTE — Progress Notes (Signed)
D/c order in place today at 01:55 PM. DCP: Home. CM met and  reviewed D/c plan and transportation with patient at bedside. Son to provide transportation home.    CM continues coordinating D/C plan.  Natasha Bence, RN, BSN, CM  RN Care Manager I  [facility]  [phone]

## 2021-10-20 NOTE — Progress Notes (Signed)
Nutritional Support Services  Nutrition Assessment    Aimee Watson 66 y.o. female   MRN: [id number]    Summary of Nutrition Recommendations:    Continue low fiber diet, monitor tolerance  Monitor bowel function   ONS of Ensure Plus BID to help pt meet estimated needs. Each Ensure Plus High Protein provides 350 kcal and 20 gm protein.  Monitor wt trends, standing as feasible  -----------------------------------------------------------------------------------------------------------------                                                        Assessment Data:   Referral Source: RN screen  Reason for Referral: MST-2 wt loss    Nutrition:   Spoke with pt at bedside, getting ready for discharge. No issues with n/v. +loose stool currently. Ate breakfast in AM of 1 pancake and some eggs. Pt states wt loss since ileostomy (9/13) from 165# to current wt of 132#, pt states current wt stable for past few months. Was talking ONS intermittently to help meet estimated needs.     Learning Needs: Discussed recommendation for lower fiber diet given recent SBO. Discussed differences between soluble and insoluble fiber. Briefly discussed probiotics. Encouraged pt to focus on wt maintenance and bowel function. Discussed with pt will likely have looser stools as 20% of rectum has been removed, but to continue to follow GI recommendations.     Hospital Admission: SBO (small bowel obstruction)   Patient Active Problem List   Diagnosis    SBO (small bowel obstruction)     Medical Hx:  has a past medical history of Rectal adenocarcinoma.    PSH: has a past surgical history that includes ROBOTIC, COLECTOMY, LAR (10/21/2020) and TAKEDOWN ILEOSTOMY (03/24/2021).     Orders Placed This Encounter   Procedures    Diet low fiber   Intake: not recorded    ANTHROPOMETRIC  Height: 160 cm (5\' 3" )  Weight: 60.3 kg (133 lb)  Body mass index is 23.56 kg/m.     Weight History Summary:    Weight Monitoring     Weight Weight Method   Aug/Sept 2023  165# (75kg) Stated   03/16/21 71.9kg Care Everywhere   10/18/2021 59.875 kg  Stated     60.328 kg  Standing Scale      ESTIMATED NEEDS  Total Daily Energy Needs: 1507.5 to 1809 kcal  Method for Calculating Energy Needs: 25 kcal - 30 kcal per kg  at 60.3 kg (Actual body weight)  Rationale: non icu, non obese     Total Daily Protein Needs: 72.36 to 90.45 g  Method for Calculating Protein Needs: 1.2 g - 1.5 g per kg at 60.3 kg (Actual body weight)  Rationale: non icu, non obese    Total Daily Fluid Needs: 1507.5 to 1809 ml  Method for Calculating Fluid Needs: 1 ml per kcal energy = 1507.5 to 1809 kcal  Rationale: or per team    Pertinent Medications:  Current Facility-Administered Medications   Medication Dose Route Frequency    famotidine  20 mg Oral Q12H SCH    Or    famotidine  20 mg Intravenous Q12H SCH    heparin (porcine)  5,000 Units Subcutaneous Q8H SCH      potassium chloride 40 mEq in dextrose 5 % and 0.9% NaCl 1,000 mL infusion 100 mL/hr  at 10/19/21 2118     PRN meds given in the past 48 hours: KCl    Pertinent labs:  Recent Labs   Lab 10/20/21  0331 10/19/21  0354 10/18/21  0803   Sodium 144 142 141   Potassium 3.7 3.2* 3.3*   Chloride 111 110 105   CO2 27 26 23    BUN 6.0* 9.0 15.0   Creatinine 0.8 0.8 0.9   Glucose 107* 124* 160*   Calcium 8.8 8.7 9.9   Magnesium 2.0 2.0 2.1   eGFR >60.0 >60.0 >60.0     Allergies   Allergen Reactions    Penicillins      Physical Assessment: Date 9/12  Head: temple region: slight depression with decrease in muscle tone/resistance (moderate muscle loss - temporalis), orbital region: slightly dark circles, somewhat hollow look, some decrease in bounce back of fat pads (mild fat loss), and buccal region: slight depression, somewhat sunken appearance, flat cheeks, decrease in bounce back of fat pads (mild fat loss)  Upper Body: clavicle bone region: some protrusion of the clavicle with decrease in muscle tone/resistance (moderate muscle loss - pectoralis major), shoulder and  acromion bone region: slight protrusion of acromion process, decrease in muscle tone/resistance (moderate muscle loss - deltoid), and upper arm region: some fat in pinch between fingers, but not ample (mild fat loss)  Lower Body: no s/s subcutaneous fat or muscle loss  Edema: none noted  Skin: WDL  GI function: soft; LBM: today (loose)                                                              Nutrition Diagnosis      Moderate Malnutrition related to increased nutrient needs in setting of chronic illness as evidenced by moderate muscle loss (temporalis, deltoid, pectoralis), mild fat loss (orbital, buccal, triceps), wt loss 16% in past 7 months. - new                                                              Intervention     Nutrition recommendation - Please refer to top of note                                                             Monitoring/Evaluation     Goals:   Meet >70% of estimated needs    Nutrition Risk Level: High (will follow up at least 2 times per week and PRN)     Renn Stille S. Allena Katz MS, RD, CNSC  Clinical Dietitian

## 2021-10-20 NOTE — Progress Notes (Signed)
Pt discharged to home per md order. Discharge instruction, follow up, and medication reviewed with pt, verbalized understanding. All questions answered. Iv line taken out, no bleeding noted. Skin intact. All patient belongings taken by the patient at the time of discharge. Pt left the unit ambulatory accompanied by son in stable condition.

## 2021-10-20 NOTE — Progress Notes (Signed)
LOS # 2      Summary of Discharge Plan: DCP: Home      Identified Possible Discharge Barriers: medical condition      CM Interventions and Outcome: Received report from medical team for possible D/C today. Pt is on clear liquid diet. Pt to f/u with surgeon      Discussed above Discharge Plan with (patient, family, Care Team, others): yes      Case Management will continue to follow on patient's discharge needs.     Natasha Bence, RN, BSN, CM  RN Care Manager I  [facility]  [phone]

## 2021-10-20 NOTE — Plan of Care (Signed)
NURSING SHIFT NOTE     Patient: Aimee Watson  Day: 2      SHIFT EVENTS     Shift Narrative/Significant Events (PRN med administration, fall, RRT, etc.):     Patient is AOX4. Vital signs are stable. Denies abdominal pain, states just mild tenderness. Patient is passing gas but no BM during shift. Tolerating clear liquid.  Safety and fall precautions remain in place. Purposeful rounding completed.          ASSESSMENT     Changes in assessment from patient's baseline this shift:    Neuro: No  CV: No  Pulm: No  Peripheral Vascular: No  HEENT: No  GI: No  BM during shift: No, Last BM: Last BM Date: 10/19/21  GU: No   Integ: No  MS: No    Pain: None  Pain Interventions: None  Medications Utilized: N/A    Mobility: PMP Activity: Step 6 - Walks in Room of Distance Walked (ft) (Step 6,7): 30 Feet           Lines     Patient Lines/Drains/Airways Status       Active Lines, Drains and Airways       Name Placement date Placement time Site Days    Peripheral IV 10/18/21 20 G Left Antecubital 10/18/21  0802  Antecubital  1    NG/OG Tube Nasogastric 16 Fr. Right nostril 10/18/21  1205  Right nostril  1                         VITAL SIGNS     Vitals:    10/20/21 0332   BP: 121/66   Pulse: 66   Resp: 18   Temp: 97.9 F (36.6 C)   SpO2: 97%       Temp  Min: 97.7 F (36.5 C)  Max: 99 F (37.2 C)  Pulse  Min: 64  Max: 77  Resp  Min: 16  Max: 18  BP  Min: 116/67  Max: 126/62  SpO2  Min: 97 %  Max: 100 %    No intake or output data in the 24 hours ending 10/20/21 0422           CARE PLAN        Problem: Safety  Goal: Patient will be free from injury during hospitalization  Outcome: Progressing  Flowsheets (Taken 10/20/2021 0421)  Patient will be free from injury during hospitalization:   Assess patient's risk for falls and implement fall prevention plan of care per policy   Provide and maintain safe environment   Use appropriate transfer methods   Ensure appropriate safety devices are available at the bedside   Include patient/  family/ care giver in decisions related to safety   Hourly rounding  Goal: Patient will be free from infection during hospitalization  Outcome: Progressing  Flowsheets (Taken 10/20/2021 0421)  Free from Infection during hospitalization:   Assess and monitor for signs and symptoms of infection   Monitor lab/diagnostic results   Monitor all insertion sites (i.e. indwelling lines, tubes, urinary catheters, and drains)   Encourage patient and family to use good hand hygiene technique     Problem: Pain  Goal: Pain at adequate level as identified by patient  Outcome: Progressing  Flowsheets (Taken 10/20/2021 0421)  Pain at adequate level as identified by patient:   Identify patient comfort function goal   Assess pain on admission, during daily assessment and/or before any "as  needed" intervention(s)   Reassess pain within 30-60 minutes of any procedure/intervention, per Pain Assessment, Intervention, Reassessment (AIR) Cycle   Evaluate if patient comfort function goal is met   Evaluate patient's satisfaction with pain management progress     Problem: Altered GI Function  Goal: Fluid and electrolyte balance are achieved/maintained  Outcome: Progressing  Flowsheets (Taken 10/20/2021 0421)  Fluid and electrolyte balance are achieved/maintained:   Monitor intake and output every shift   Monitor/assess lab values and report abnormal values   Provide adequate hydration   Assess for confusion/personality changes   Monitor daily weight   Assess and reassess fluid and electrolyte status  Goal: Elimination patterns are normal or improving  Outcome: Progressing  Flowsheets (Taken 10/20/2021 0421)  Elimination patterns are normal or improving:   Report abnormal assessment to physician   Assess for normal bowel sounds   Monitor for abdominal distension   Monitor for abdominal discomfort

## 2021-10-20 NOTE — Malnutrition Assessment (Signed)
Aimee Watson is a 66 y.o. female patient.   [id number]    Malnutrition Assessment   Malnutrition Documentation    Moderate Malnutrition related to increased nutrient needs in setting of chronic illness as evidenced by moderate muscle loss (temporalis, deltoid, pectoralis), mild fat loss (orbital, buccal, triceps), wt loss 16% in past 7 months.     Aimee Watson S. Allena Katz MS, RD, CNSC  Clinical Dietitian    If physician disagrees with this assessment see addendum.

## 2021-10-20 NOTE — Plan of Care (Signed)
Problem: Safety  Goal: Patient will be free from injury during hospitalization  Outcome: Progressing  Flowsheets (Taken 10/20/2021 0820)  Patient will be free from injury during hospitalization:   Assess patient's risk for falls and implement fall prevention plan of care per policy   Provide and maintain safe environment  Goal: Patient will be free from infection during hospitalization  Outcome: Progressing  Flowsheets (Taken 10/20/2021 0820)  Free from Infection during hospitalization:   Assess and monitor for signs and symptoms of infection   Monitor lab/diagnostic results     Problem: Pain  Goal: Pain at adequate level as identified by patient  Outcome: Progressing  Flowsheets (Taken 10/20/2021 0820)  Pain at adequate level as identified by patient:   Identify patient comfort function goal   Assess pain on admission, during daily assessment and/or before any "as needed" intervention(s)

## 2021-10-20 NOTE — Progress Notes (Signed)
D/C plan reviewed. Son to provide transportation home    DCP: Home       10/20/21 1506   Discharge Disposition   Patient preference/choice provided? Yes   Physical Discharge Disposition Home   Mode of Transportation Car   Patient/Family/POA notified of transfer plan Patient informed only   Patient agreeable to discharge plan/expected d/c date? Yes   Bedside nurse notified of transport plan? Yes   CM Interventions   Follow up appointment scheduled? No  (Pt to set up f/u appointments)   Notified MD? Yes   Multidisciplinary rounds/family meeting before d/c? Yes   Medicare Checklist   Is this a Medicare patient? Yes     Natasha Bence, RN, BSN, CM  RN Care Manager I  [facility]  [phone]

## 2021-10-20 NOTE — Discharge Summary (Signed)
Discharge Summary    Date:10/20/2021   Patient Name: Aimee Watson,Aimee Watson  Attending Physician: Jeri LagerSolipuram, Syona Wroblewski, MD    Date of Admission:   10/18/2021    Date of Discharge:   10/20/2021  Discharge Dx:   Small bowel obstruction  History of rectal adenocarcinoma s/p FOLFOX with diabetic ileostomy, reversal 2023  Normocytic anemia  Hypokalemia  Moderate malnutrition  Cholelithiasis  Treatment Team:   Treatment Team:   Attending Provider: Jeri LagerSolipuram, Briscoe Daniello, MD  Consulting Physician: Lorene DyWill, Edward C, DO     Procedures performed:   Radiology: all results from this admission  Abdomen 2 View With Chest 1 View    Result Date: 10/18/2021  Mildly dilated loops of small bowel throughout the mid abdomen. Fonnie MuBrendan Waters, DO 10/18/2021 12:59 PM    CT Abd/Pelvis with IV Contrast    Result Date: 10/18/2021  1. Findings consistent with high-grade partial versus early complete small bowel obstruction, with transition point within the right lower quadrant near the ileostomy reversal site. 2. Cholelithiasis without CT evidence of acute cholecystitis. Casimiro NeedleMichael [facility] 10/18/2021 11:03 AM     Hospital Course:   66 year old female with a past medical history of rectal carcinoma s/p surgery, chemoradiation with ileostomy placed in 20 2 2  and reversed in February 2023 admit to the hospital for abdominal pain, nausea vomiting, diarrhea chills as well as some urinary symptoms.  CT abdomen pelvis consistent with high-grade partial versus early complete SBO near ileostomy reversal site.  Patient was seen by general surgery, made n.p.o. and NG tube was placed for decompression.  Patient started passing gas, NG tube was discontinued and started on clear liquid diet and slowly advanced as patient tolerated.  Patient was cleared by surgery for discharge and subsequently discharged home.    Condition at Discharge:   Stable  Today:     BP 125/70   Pulse 63   Temp 98.1 F (36.7 C) (Oral)   Resp 18   Ht 1.6 m (5\' 3" )   Wt 60.3 kg (133 lb)   SpO2 100%    BMI 23.56 kg/m   Ranges for the last 24 hours:  Temp:  [97.3 F (36.3 C)-98.8 F (37.1 C)] 98.1 F (36.7 C)  Heart Rate:  [63-66] 63  Resp Rate:  [16-18] 18  BP: (116-125)/(64-70) 125/70    Seen and examined on the day of discharge.  Noted to be ambulating in the hallways.  Had a small bowel movement yesterday after removal of NG tube.  Continues to pass gas.  Tolerating diet  Discussed with general surgery, patient was able to tolerate low fiber diet, was okay for discharge from their end.    GEN APPEARANCE: Normal affect, chronically ill appearing, in NAD;  A&OX3  HEENT: PERLA; EOMI; Conjunctiva Clear, NGT intact to wall suction  NECK: Supple; No bruits  CVS: RRR, S1, S2; No M/G/R  LUNGS: CTAB; No Wheezes; No Rhonchi: No rales  ABD: Soft; mild TTP; + Normoactive BS, +gas and small BMs  EXT: No edema; Pulses 2+ and intact  SKIN: No rash or Lesions  NEURO: CN 2-12 intact; No Focal neurological deficits  Micro / Labs / Path pending:     Unresulted Labs       Procedure . . . Date/Time    Magnesium [[id number]] Collected: 10/20/21 0331    Specimen: Blood Updated: 10/20/21 0332    Narrative:      Use  blood in the lab  Discharge Instructions:      Follow-up Information       Pcp, None, MD .                             Discharge Diet: Regular Diet        Discharge References/Attachments    None       Disposition:        Discharge Medication List      You have not been prescribed any medications.       Minutes spent coordinating discharge and reviewing discharge plan: 40 minutes      Signed by: Jeri Lager, MD

## 2021-10-20 NOTE — Discharge Instr - AVS First Page (Addendum)
Reason for your Hospital Admission:  Small bowel obstruction      Instructions for after your discharge:  Follow-up with your primary care physician within a week of discharge  Go to nearest emergency department if you experience any intolerance to food, constipation

## 2021-11-12 ENCOUNTER — Ambulatory Visit: Payer: Medicare Other | Attending: Hematology | Admitting: Hematology

## 2021-11-12 ENCOUNTER — Encounter: Payer: Self-pay | Admitting: Hematology

## 2021-11-12 ENCOUNTER — Telehealth: Payer: Self-pay

## 2021-11-12 ENCOUNTER — Other Ambulatory Visit: Payer: Self-pay

## 2021-11-12 VITALS — BP 123/73 | HR 70 | Temp 97.9°F | Resp 16 | Wt 127.0 lb

## 2021-11-12 DIAGNOSIS — Z719 Counseling, unspecified: Secondary | ICD-10-CM

## 2021-11-12 DIAGNOSIS — C20 Malignant neoplasm of rectum: Secondary | ICD-10-CM | POA: Insufficient documentation

## 2021-11-12 DIAGNOSIS — K56609 Unspecified intestinal obstruction, unspecified as to partial versus complete obstruction: Secondary | ICD-10-CM | POA: Insufficient documentation

## 2021-11-12 LAB — CBC AND DIFFERENTIAL
Absolute NRBC: 0 10*3/uL (ref 0.00–0.00)
Basophils Absolute Automated: 0.06 10*3/uL (ref 0.00–0.08)
Basophils Automated: 1.3 %
Eosinophils Absolute Automated: 0.09 10*3/uL (ref 0.00–0.44)
Eosinophils Automated: 2 %
Hematocrit: 37.2 % (ref 34.7–43.7)
Hgb: 12.3 g/dL (ref 11.4–14.8)
Immature Granulocytes Absolute: 0.01 10*3/uL (ref 0.00–0.07)
Immature Granulocytes: 0.2 %
Instrument Absolute Neutrophil Count: 2.96 10*3/uL (ref 1.10–6.33)
Lymphocytes Absolute Automated: 0.96 10*3/uL (ref 0.42–3.22)
Lymphocytes Automated: 21.2 %
MCH: 30.3 pg (ref 25.1–33.5)
MCHC: 33.1 g/dL (ref 31.5–35.8)
MCV: 91.6 fL (ref 78.0–96.0)
MPV: 10.1 fL (ref 8.9–12.5)
Monocytes Absolute Automated: 0.44 10*3/uL (ref 0.21–0.85)
Monocytes: 9.7 %
Neutrophils Absolute: 2.96 10*3/uL (ref 1.10–6.33)
Neutrophils: 65.6 %
Nucleated RBC: 0 /100 WBC (ref 0.0–0.0)
Platelets: 195 10*3/uL (ref 142–346)
RBC: 4.06 10*6/uL (ref 3.90–5.10)
RDW: 13 % (ref 11–15)
WBC: 4.52 10*3/uL (ref 3.10–9.50)

## 2021-11-12 LAB — RRL - CMP
ALT Piccolo(R): <20 U/L (ref 10–47)
AST Piccolo(R): 28 U/L (ref 11–38)
Albumin Piccolo(R): 4.1 g/dL (ref 3.3–5.0)
Alkaline Phosphatase Piccolo(R): 77 U/L (ref 42–141)
BUN Piccolo(R): 16 mg/dL (ref 7.0–19.0)
Bilirubin Total Piccolo(R): 1 mg/dL (ref 0.2–1.6)
CO2 Piccolo(R): 28 mEq/L (ref 21–29)
Calcium Piccolo(R): 9.9 mg/dL (ref 8.5–10.5)
Chloride Piccolo(R): 104 mEq/L (ref 98–107)
Creatinine Piccolo(R): 0.5 mg/dL (ref 0.4–1.5)
Glucose Piccolo(R): 96 mg/dL (ref 70–100)
Potassium Piccolo(R): 4 mEq/L (ref 3.5–5.1)
Protein Piccolo(R): 7.6 g/dL (ref 6.4–8.1)
Sodium Piccolo(R): 143 mEq/L (ref 136–145)
eGFR: >60 mL/min/{1.73_m2} (ref >=60)

## 2021-11-12 LAB — CEA: CEA: <1.7 ng/mL (ref 0.0–5.0)

## 2021-11-12 NOTE — Telephone Encounter (Signed)
New Patient Education: Nurse Initial Visit    [x]   Introduced self to patient and family, including name, title, and credentials.  [x]   Included patient and family members/significant others in education: Patient  [x]   Assessed for barriers to education.   None Identified  [x]   Preferred language:  English  [x]   Preferred method of education (select all that apply):  verbal instuctions, handouts  [x]   Assistance needed for education (select all that apply):  None  [x]   Education packet given to patient and handouts reviewed. Handouts include: clinic specific information, When to Call the Clinic. Other handouts: PT locations, Dr. Alcide Evener information, Avelina Laine mobile lab draw information  [x]   Patient/family education on [employer] telephone tree and importance of following prompts  [x]   Patient/family educated on programs and services of [facility] (handout   in new patient packet).  [x]  Patient/family educated on CURES Act & access to all test results  [x]   Reviewed the education process with patient/family and set up following up appointments (date/time/place):   [x]   Labs: today, 11/12/21  [x]   Tests: CT C/A/P 12/07/21 @ 11AM, arrive at 9:30AM for contrast. Nothing to eat or drink 2 hrs before scan. Location: [facility] [address], [place], [place] [address]  [x]   Referrals: LWC, nutrition, GI (Rupa Sherryll Burger information provided) Pelvic PT (referral placed, pt provided information on locations to contact)   [x]   Patient/family members questions addressed.  [x]   Evaluated patient/family members understanding of education.  Patient verbalizes understanding.  [x]   Action Plan: plan for labs today, pt to set up [facility] mobile lab draw. Information sheet on how to set up draw provided to pt. Communicated understanding and encouraged to reach out with any questions or issues setting up lab draw. CT in about 4 weeks and f/u with Dr. Nedra Hai post scan, 12/10/21.

## 2021-11-12 NOTE — Progress Notes (Signed)
OCT attempted to contact this patient via telephone for an introduction and check-in based off staff referral. OCT was not able to successfully connect with this patient and left a message including call back information.    OCT will make a 2nd attempt to connect with the patient.    [facility] 11/12/2021   Location Telephone   Patient Type Adult: Cancer patient   Time Spent 15 minutes   Intervention Level 1 - Outreach          Tanja Port, MSW  Oncology Therapist I   Supervisee in Social Work  [facility]  [facility]  [address]  [place], [place] [address]  [phone]  [email or web address]   [facility] Radiation

## 2021-11-12 NOTE — Progress Notes (Signed)
[facility]  [address]  [place], [place] [address]  P: [phone]  F: [phone]        [facility]  [address] [place], [address]  [place], [place] [address]  P: [phone]  [phone]    Dr. Referring    I had the pleasure of seeing your patient in our department today for an oncology consult visit. Below, you will find her initial consultation note.  If you have any questions, please feel free to contact me.  Again, thank you for allowing me to participate in this patients care.    CHIEF COMPLAINT  History of rectal cancer    HISTORY OF PRESENT ILLNESS    Aimee Watson is 66 y.o. female who comes in with No chief complaint on file.  .    She presented to medical attention with blood per rectum and had a colonoscopy in 01/2020 which revealed a mass 2cm from the anal verge. A biopsy confirmed adenocarcinoma. There was no e/o metastatic disease by CT. An MRI revealed a T2-T3 cancer with involvement of the internal sphincter and 2 positive mesorectal nodes. In 02/2020 she started neoadjuvant FOLFOX, then underwent chemoRT in 07/2020-08/2020. She then had LAR with coloanal anastomosis and ypT1a/N0 disease (0/3 nodes), a near CR to therapy. She had MSI-L disease.    She had ostomy reversal in 03/2021. She has had long term side effects from treatment including neuropathy in her hands leading to difficulties with numbness and tingling in the fingertips, joints locking in her fingers, and tightness in her palms. She reports some cramping in her feet for which she takes an over-the-counter potassium supplement.    She reports bladder prolapse and urinary incontinence since chemoRT. She has had very frequent and loose bowel movements. She was taking psyllium to help manage this and was down to 3 soft bowel movements daily, with occasional bouts of frequent BM up to 8 daily triggered by certain foods.     She recently moved to the [place]  area to live with her son. She was admitted to [facility] 9/10-9/12 with SBO. The transition point was near the ileostomy reversal site. The SBO resolved with conservative management.     Since hospitalization she has been afraid to take more psyllium, so has had more frequent bowel movements.     She is here to establish care with oncology.    I reviewed notes from the hospital, prior oncologist/colorectal surgeon on the date of service.    Allergies   Allergen Reactions    Penicillins Rash     Current Outpatient Medications   Medication Sig Dispense Refill    ascorbic acid (VITAMIN C) 250 MG tablet Take 1 tablet (250 mg) by mouth      Cholecalciferol (Vitamin D3) 2000 UNIT capsule Take 1 capsule (2,000 Units) by mouth      diphenoxylate-atropine (LOMOTIL) 2.5-0.025 MG per tablet TAKE 1 TABLET BY MOUTH FOUR TIMES DAILY AS NEEDED FOR DIARRHEA      L-Lysine HCl 500 MG Tab Take by mouth every evening      loperamide (IMODIUM A-D) 2 MG tablet Take 1 tablet (2 mg) by mouth 4 (four) times daily as needed      Multiple Vitamins-Minerals (Multi Vitamin/Minerals) Tab Take 1 tablet by mouth      omeprazole (PriLOSEC) 20 MG capsule Take 1 capsule (20 mg) by mouth      Potassium 99 MG Tab Take 1 tablet (99 mg) by  mouth       No current facility-administered medications for this visit.     Past Medical History:   Diagnosis Date    Rectal adenocarcinoma      Past Surgical History:   Procedure Laterality Date    ROBOTIC, COLECTOMY, LAR  10/21/2020    [place] ILEOSTOMY  03/24/2021     No family history on file.  Social History     Socioeconomic History    Marital status: Widowed   Tobacco Use    Smoking status: Never    Smokeless tobacco: Never     Social Determinants of Health     Financial Resource Strain: Low Risk  (11/09/2021)    Overall Financial Resource Strain (CARDIA)     Difficulty of Paying Living Expenses: Not hard at all   Food Insecurity: No Food Insecurity (11/09/2021)    Hunger Vital Sign     Worried About  Running Out of Food in the Last Year: Never true     Ran Out of Food in the Last Year: Never true   Transportation Needs: No Transportation Needs (11/09/2021)    PRAPARE - Therapist, art (Medical): No     Lack of Transportation (Non-Medical): No   Physical Activity: Insufficiently Active (11/09/2021)    Exercise Vital Sign     Days of Exercise per Week: 5 days     Minutes of Exercise per Session: 20 min   Stress: No Stress Concern Present (11/09/2021)    [employer] of Occupational Health - Occupational Stress Questionnaire     Feeling of Stress : Only a little   Social Connections: Moderately Isolated (11/09/2021)    Social Connection and Isolation Panel [NHANES]     Frequency of Communication with Friends and Family: More than three times a week     Frequency of Social Gatherings with Friends and Family: More than three times a week     Attends Religious Services: 1 to 4 times per year     Active Member of [employer] or Organizations: No     Attends Banker Meetings: Never     Marital Status: Widowed   Intimate Partner Violence: Not At Risk (11/09/2021)    Humiliation, Afraid, Rape, and Kick questionnaire     Fear of Current or Ex-Partner: No     Emotionally Abused: No     Physically Abused: No     Sexually Abused: No   Housing Stability: Low Risk  (11/09/2021)    Housing Stability Vital Sign     Unable to Pay for Housing in the Last Year: No     Number of Places Lived in the Last Year: 2     Unstable Housing in the Last Year: No     Review of Systems   Constitutional:  Positive for appetite change and fatigue. Negative for fever.   HENT:  Negative for hearing loss, sinus pain, sore throat, tinnitus and voice change.    Respiratory:  Negative for wheezing.    Cardiovascular:  Negative for chest pain and palpitations.   Gastrointestinal:  Positive for constipation, diarrhea, nausea and vomiting. Negative for blood in stool.   Genitourinary:  Negative for hematuria.   Skin:   Negative for rash.   Neurological:  Positive for numbness. Negative for dizziness, tremors, seizures and headaches.   Psychiatric/Behavioral:  Negative for confusion. The patient is nervous/anxious.        Objective:     Vitals:  11/12/21 1009   BP: 123/73   Pulse: 70   Resp: 16   Temp: 97.9 F (36.6 C)   SpO2: 99%     Physical Exam  Constitutional:       General: She is not in acute distress.     Appearance: Normal appearance.   HENT:      Head: Normocephalic.   Eyes:      General: No scleral icterus.     Extraocular Movements: Extraocular movements intact.   Cardiovascular:      Rate and Rhythm: Normal rate and regular rhythm.      Pulses: Normal pulses.      Heart sounds: Normal heart sounds.   Pulmonary:      Effort: Pulmonary effort is normal. No respiratory distress.      Breath sounds: Normal breath sounds. No wheezing or rales.   Abdominal:      General: Abdomen is flat. There is no distension.      Palpations: Abdomen is soft. There is no mass.      Tenderness: There is no abdominal tenderness. There is no guarding.   Musculoskeletal:         General: No swelling.      Cervical back: Neck supple.      Right lower leg: No edema.      Left lower leg: No edema.   Lymphadenopathy:      Cervical: No cervical adenopathy.   Skin:     General: Skin is warm and dry.      Coloration: Skin is not jaundiced.   Neurological:      Mental Status: She is alert and oriented to person, place, and time. Mental status is at baseline.       LAB DATA:   Component      Latest Ref Rng 11/12/2021   WBC      3.10 - 9.50 x10 3/uL 4.52    Hemoglobin      11.4 - 14.8 g/dL 78.2    Hematocrit      34.7 - 43.7 % 37.2    Platelet Count      142 - 346 x10 3/uL 195        Component      Latest Ref Rng 11/12/2021   Glucose Piccolo(R)      70 - 100 mg/dL 96    BUN Piccolo(R)      7.0 - 19.0 mg/dL 95.6    Creatinine Piccolo(R)      0.4 - 1.5 mg/dL 0.5    Sodium Piccolo(R)      136 - 145 mEq/L 143    Potassium Piccolo(R)      3.5 - 5.1 mEq/L  4.0    Chloride Piccolo(R)      98 - 107 mEq/L 104    CO2 Piccolo(R)      21 - 29 mEq/L 28    Calcium Piccolo(R)      8.5 - 10.5 mg/dL 9.9    Protein Piccolo(R)      6.4 - 8.1 g/dL 7.6    Albumin Piccolo(R)      3.3 - 5.0 g/dL 4.1    AST Piccolo(R)      11 - 38 U/L 28    ALT Piccolo(R)      10 - 47 U/L <20    Alkaline Phosphatase Piccolo(R)      42 - 141 U/L 77    Bilirubin Total Piccolo(R)      0.2 - 1.6  mg/dL 1.0          RADIOLOGY DATA:   10/18/21 CT A/P  IMPRESSION:         1. Findings consistent with high-grade partial versus early complete small  bowel obstruction, with transition point within the right lower quadrant  near the ileostomy reversal site.  2. Cholelithiasis without CT evidence of acute cholecystitis.       Assessment & Plan:   Ms. Aimee Watson is a 66 year old woman with a history of stage 3 (T3/N1) rectal cancer, s/p total neoadjuvant therapy and LAR.     1) Rectal cancer:  -she had a near CR to treatment with ypT1a/N0 disease (0/3 nodes).   -discussed with her that at this point I would recommend she be monitored for recurrence.  -recommendations per NCCN for H/P every 3-6 months for 2y, then q88mo for a total of 5y. CEA at the same interval, CTDNA every 3 months for now.   -CT C/A/P every 6-12 months for a total of 5y. Will plan CT C/A/P next month.  -colonoscopy one year after surgery, will refer to GI Dr. Alcide Evener.     2) SBO:  -recent SBO likely due to adhesions from prior surgery. No e/o recurrence on last CT.  -follow up next CT in a month    3) Supportive care:  -[facility]  -Nutrition  -pelvic PT for incontinence and pelvic symptoms   -needs new [place] PMD    She will return to the office to follow up results after her CT.      I spent a total of 80 minutes reviewing the chart, discussing the case with consultants, and discussing the plan of care with the patient on the date of service.     1. Rectal cancer  CT Abdomen Pelvis W IV And PO Cont    CT Chest W Contrast    CBC and  differential    RRL - CMP    CEA    Referral to [facility]    Ambulatory referral to Nutrition Services    Ambulatory referral to Physical Therapy    Referral to Gastroenterology ([place])    CBC and differential    RRL - CMP    CEA    RRL - CMP    CEA    CBC and differential      2. SBO (small bowel obstruction)  Referral to Gastroenterology ([place])        Orders Placed This Encounter   Procedures    CT Abdomen Pelvis W IV And PO Cont     Standing Status:   Future     Standing Expiration Date:   11/13/2022     Order Specific Question:   Clinical info for radiologist     Answer:   rectal cancer     Order Specific Question:   Release to patient     Answer:   Immediate    CT Chest W Contrast     Standing Status:   Future     Standing Expiration Date:   11/13/2022     Order Specific Question:   Clinical info for radiologist     Answer:   rectal cancer, monitor     Order Specific Question:   Release to patient     Answer:   Immediate    CBC and differential     Standing Status:   Future     Number of Occurrences:  1     Standing Expiration Date:   11/13/2022     Order Specific Question:   Release to patient     Answer:   Immediate    RRL - CMP     Standing Status:   Future     Number of Occurrences:   1     Standing Expiration Date:   11/13/2022     Order Specific Question:   Release to patient     Answer:   Immediate    CEA     Standing Status:   Future     Number of Occurrences:   1     Standing Expiration Date:   11/13/2022     Order Specific Question:   Release to patient     Answer:   Immediate    CBC and differential     Standing Status:   Future     Number of Occurrences:   1     Standing Expiration Date:   02/12/2022     Order Specific Question:   Release to patient     Answer:   Immediate    RRL - CMP     Standing Status:   Future     Number of Occurrences:   1     Standing Expiration Date:   02/12/2022     Order Specific Question:   Release to patient     Answer:   Immediate    CEA     Standing Status:    Future     Number of Occurrences:   1     Standing Expiration Date:   11/13/2022     Order Specific Question:   Release to patient     Answer:   Immediate    Referral to [facility]     Standing Status:   Future     Standing Expiration Date:   11/13/2022     Referral Priority:   Routine     Referral Type:   Consultation     Referral Reason:   Specialty Services Required     Number of Visits Requested:   1    Ambulatory referral to Nutrition Services     Standing Status:   Future     Standing Expiration Date:   11/13/2022     Referral Priority:   Routine     Referral Type:   Consultation     Referral Reason:   Specialty Services Required     Requested Specialty:   Registered Dietitian Nutritionist     Number of Visits Requested:   1    Ambulatory referral to Physical Therapy     Standing Status:   Future     Standing Expiration Date:   11/13/2022     Referral Priority:   Routine     Referral Type:   Consultation     Referral Reason:   Specialty Services Required     Requested Specialty:   Physical Therapy     Number of Visits Requested:   1    Referral to Gastroenterology ([place])     Standing Status:   Future     Standing Expiration Date:   11/13/2022     Referral Priority:   Routine     Referral Type:   Consultation     Referral Reason:   Specialty Services Required     Requested Specialty:   Gastroenterology     Number of Visits Requested:  1     There are no Patient Instructions on file for this visit.    Leonides Schanz, MD  Answers submitted by the patient for this visit:  [facility] - Patient Intake Form (Submitted on 11/09/2021)  Chief Complaint: Symptoms  Unexpected weight loss: No  Unexpected weight gain: No  Night sweats: No  Hot flashes: No  itching: No  blurred vision: No  Bleeding gums: No  difficulty breathing: No  Pain with breathing: No  Chronic cough: No  Bloody sputum: No  Snoring: No  Rectal bleeding: No  heartburn: Yes  Vaginal dryness: Yes  Urinary frequency: Yes  bladder  incontinence: Yes  Change in urinary stream: Yes  Difficulty walking: No  tingling: Yes  Memory Loss: No  mood changes: No  Depressed mood: No  Difficulty sleeping: Yes  Bruise or bleeds easily: No

## 2021-11-13 ENCOUNTER — Telehealth (INDEPENDENT_AMBULATORY_CARE_PROVIDER_SITE_OTHER): Payer: Self-pay

## 2021-11-13 NOTE — Telephone Encounter (Signed)
DATE: 11/13/2021    [facility] representative attempted to call patient, regrettably without success. LWC left a voice message requesting a call back at patient's earliest convenience. A team member will place a follow up call within the next 5-7 days, if there is no sooner communication        [employer] Intake Coordinator  [facility]  [phone]

## 2021-11-17 ENCOUNTER — Other Ambulatory Visit: Payer: Self-pay

## 2021-11-18 ENCOUNTER — Telehealth (INDEPENDENT_AMBULATORY_CARE_PROVIDER_SITE_OTHER): Payer: Self-pay

## 2021-11-18 ENCOUNTER — Encounter (INDEPENDENT_AMBULATORY_CARE_PROVIDER_SITE_OTHER): Payer: Self-pay

## 2021-11-18 NOTE — Telephone Encounter (Signed)
Error

## 2021-11-19 ENCOUNTER — Telehealth (INDEPENDENT_AMBULATORY_CARE_PROVIDER_SITE_OTHER): Payer: Self-pay

## 2021-11-19 NOTE — Telephone Encounter (Signed)
Date: 11/19/2021       [facility] representative spoke with patient as a follow up from previous contact encounter on 10/6. Patient will connect with department when ready      [facility] Intake Coordinator  [facility]  [phone]
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
## 2021-11-24 ENCOUNTER — Encounter (INDEPENDENT_AMBULATORY_CARE_PROVIDER_SITE_OTHER): Payer: Self-pay | Admitting: Gastroenterology

## 2021-11-24 ENCOUNTER — Ambulatory Visit (INDEPENDENT_AMBULATORY_CARE_PROVIDER_SITE_OTHER): Payer: Medicare Other | Admitting: Gastroenterology

## 2021-11-24 VITALS — BP 137/75 | HR 72 | Temp 97.9°F | Resp 18 | Ht 63.0 in | Wt 127.2 lb

## 2021-11-24 DIAGNOSIS — C20 Malignant neoplasm of rectum: Secondary | ICD-10-CM

## 2021-11-24 DIAGNOSIS — Z85048 Personal history of other malignant neoplasm of rectum, rectosigmoid junction, and anus: Secondary | ICD-10-CM

## 2021-11-24 MED ORDER — PEG 3350/ELECTROLYTES 240 G PO SOLR
1.0000 | Freq: Once | ORAL | 0 refills | Status: AC
Start: 2021-11-24 — End: 2021-11-24

## 2021-11-24 NOTE — Patient Instructions (Addendum)
To schedule or reschedule a procedure, please call my procedure coordinator Hannah Holmes at [phone].      PLEASE NOTE:  YOU MUST HAVE SOMEONE DRIVE YOU HOME FROM YOUR PROCEDURE, OTHER THAN A TAXI OR YOUR PROCEDURE WILL BE CANCELLED!      You must report to the location 1 HOUR prior to your scheduled procedure time.        []  [facility]     [address]  [place], [place] [address]   [phone]      [] [facility]:  Report to the registration desk at the yellow entrance     [address]  [place], [place] [address]  [phone]      [] [facility]     [address]   [place], [place] [address]   [phone]      []  [facility]    [address]  , [place] [address]  [phone]      []   [facility] [place]     [address]   [place],  [address]  [phone]        Split Dose Colonoscopy Preparation Instructions - COLYTE, GAVILYTE, GOLYTELY, OR NULYTELY  Please carefully read a week before your procedure.    IF YOU ARE ON BLOOD THINNERS (COUMADIN, PLAVIX, etc.), INSULIN OR OTHER DIABETIC MEDICATIONS, PLEASE LET US KNOW AND CHECK WITH YOUR PRESCRIBING PHYSICIAN FOR INSTRUCTIONS. Your prescribing provider needs to determine if you should stop or stay on your blood thinner before procedure.  Not following these instructions may result in cancellation.      General Endoscopy Information  Do no chew gum or suck on hard candy the day of your procedure.  You must have a responsible adult to accompany you home after the procedure. This person must pick you up in the endoscopy unit. If you do not have a responsible adult to accompany you home, your procedure will be cancelled.   You may not operate a motor vehicle for the remainder of the day following your procedure.   You may not take a taxi, Uber or bus home unless accompanied by a responsible  adult.   If your insurance company requires a REFERRAL, YOU MUST BRING IT WITH YOU. Also, please bring your current insurance card(s), copay (if applicable), and a current picture ID with you on the day of your procedure.   Our office will contact your insurance carrier to verify coverage and, if required, obtain preauthorization for your procedure. However, preauthorization is not a guarantee of payment and you will be responsible for any deductibles, copays, co-insurance, and/or any other plan specific out-of-pocket expenses.   Dependent upon your family history, personal history, prior gastroenterology diagnoses, or findings discovered during your colonoscopy, your procedure may be considered preventative or diagnostic. This determination will not be made until after the procedure has concluded and will be based upon the findings of your exam. In our experience, many insurance carriers cover preventative and diagnostic colonoscopies differently, and as a result, your out-of-pocket payment may also differ. If you have any questions about your coverage, please contact your insurance carrier directly.   If you have any questions, please contact your GI physician's office during normal business hours.    Preparation Instructions    One (1) day before the procedure date: Split Dose: 1st half    Drink only clear liquids   the entire day. No solid food should be taken. Clear liquids include: water, broth without any solid pieces, apple juice, white grape juice, pulp free lemonade, sprite, ginger-ale, coffee or tea without milk or nondairy creamers, plain Jell-O (no added fruit or toppings, no red, purple, or blue Jell-O). See clear diet below.    Prepare the solution: Gavilyte, Golytely, Nulytely or Colyte Prep: mix the powder with water in the provided plastic container to the 'fill" line and chill in the refrigerator.   You may add "Crystal Light" powdered lemonade (as an alternative to the flavor packets) to the  solution to improve its taste.  No solid food should be taken during or after the prep.   At 5 p.m.: Begin 1st dose of the prep solution at a rate of 8 ounces every 15-30 minutes (over 1-2 hours) until half of the prep solution is finished. If you feel full or nauseated by drinking the solution, then slow down and finish the first half of the solution before midnight.  Please continue to drink clear liquids until you go to bed.     * It is not uncommon for individuals to experience bloating or nausea when drinking the solution. If vomiting or other symptoms concern you, please call your GI physician's office.    The day of your procedure: Split dose: 2nd half    6 hours before your procedure time: Start 2nd dose of prep solution. Depending on your procedure start time this may require waking up early to complete the prep.  Drink the solution at a rate of 8 ounces every 15-30 minutes (over 1-2 hours) until you finish the entire solution.  It is important that you finish the remaining 2 liters of the prep solution at least 4 hours before your scheduled procedure.  Complete 2nd dose 4 hours prior to your scheduled start time. Do not take anything by mouth starting 4 hours before your procedure.  Do not eat hard candy or chew gum.   Wear comfortable clothing that is easy to remove and leave jewelry at home. Leave any valuables (i.e., purse, cell phone etc.) with family or companion.  Report to the Endoscopy Procedure Unit 90 minutes before your scheduled procedure time.  Once in the pre-procedural area, you will be asked to put on a hospital gown. A nurse will review your medical history with you (Bring a list of your current medication, allergies, and a copy of your recent EKG). An intravenous line (IV) will be started for your sedation during the procedure.  When your procedure is done, you may remain in the recovery room for up to 1 hour.  Your doctor will discuss the results of your procedure with you and give you a  copy of your report.     CLEAR LIQUID DIET   THESE ITEMS ARE ALLOWED:  Water  Clear broth: beef, chicken, vegetable  Juices  Apple juice or cider  White grape juice, white cranberry juice  Tang  Lemonade  Soda (clear color)  Tea  Coffee (without cream)  Clear gelatin (without fruit)  Popsicles (without fruit or cream)  Italian ices THESE ITEMS ARE NOT ALLOWED:  Milk  Cream  Milkshakes  Tomato juice  Orange juice  Cream soups  Any soup other than the listed broth  Oatmeal  Cream of Wheat  Grapefruit juice  Alcohol     PLEASE NOTE: It is extremely important to follow the preparation listed above, so that the doctor will be able to   see your entire colon. Your colon must be clear of any stool. Inadequate preparation limits the value of this procedure and could necessitate rescheduling of the examination.     * If you need to reschedule or cancel your appointment, please call you GI physician's office.      FREQUENTLY ASKED QUESTIONS:    My procedure is in the afternoon. Can I eat in the morning?   A: No. To ensure your safety during the procedure, it is important that the stomach is empty. Any food or liquid in the stomach at the time of the procedure places you at risk of aspirating these contents into the lung leading to a serious complication called aspiration pneumonia. You can drink water until 4 hours before your procedure time.     I ate breakfast (lunch or dinner) the day before my colonoscopy. Is that okay?    A: If the preparation instructions were not followed properly, residual stool may remain in the colon and hide important findings from the examining physician. In some cases, if the colon preparation is not good, you may have to repeat the preparation and the exam. If you accidentally eat any solid food the day before your exam, please call and ask to speak with a member of the nursing staff. You may be asked to reschedule your procedure.     I don't have a ride. Is that okay?   A: No. If you do not have  a responsible adult to accompany you home, YOUR PROCEDURE WILL BE CANCELLED.    How many days prior to the procedure should I discontinue my Coumadin, Plavix or other blood thinning medications?   A: If you are taking any blood thinning medications please let your endoscopist know. Generally speaking, you should quit taking your blood thinning medication 2-7 days prior to some procedures depending on the medication; however, you must check with your endoscopist and your prescribing physician to ensure that it is needed and safe for you to do so.    What medications can I take the day before and the day of my procedure?  A: The day prior to your procedure take your medications the way you normally would. However, for those patients taking any type of bowel cleansing preparation, be advised that you may undergo a prolonged period of diarrhea that may flush oral medications out of your system before they have time to take effect. The morning of your procedure you should take any blood pressure or heart medications you may be on with a small sip of water. You can hold most other medications and take them once your procedure has been completed. If you have questions about specific medication(s), please call a member of the endoscopy unit clinical staff.    I am diabetic. Do I take my insulin?   A: You must direct the question to the physician who placed you on this medication. Please check your blood sugar the morning of your procedure as you normally would. If you have any questions about your diabetes management in conjunction with your fast for your endoscopic procedure, please consult your primary physician.     I am on pain medication? Can I take it prior to my procedure?   A: You may take your prescription pain medications prior to your procedure with a small sip of water. Please inform the pre-procedural clinical staff of any medications you've taken the day of your procedure. If you have any questions, please  call a member   of the endoscopy unit clinical staff.    I'm having a menstrual period. Should I reschedule my colonoscopy appointment?   A: No. Your menstrual period will not interfere with your physician's ability to complete your procedure.     May I continue taking my iron tablets?   A: No. Iron may cause the formation of dark color stools which can make it difficult for the physician to complete your colonoscopy if your preparation is less than optimal. We recommend you stop taking your oral iron supplements at least one week prior to your procedure.     I have been on Aspirin therapy for my heart. Should I continue to take it?   A: Aspirin may affect blood coagulation. Please check with your endoscopist and prescribing physician.     I am having a colonoscopy tomorrow. I started my colon preparation on time but now I am experiencing diarrhea and/or a bloating feeling. What should I do?   A: Nausea, vomiting and a sense of fullness or bloating can occur any time after beginning your colon preparation. However, it is important that you drink all the preparation. For most people, taking an hour break from the preparation will usually help. Then continue taking the preparation as ordered. If the vomiting returns or symptoms get worse, please call your GI physician's office.    If you have any questions or concerns, please don't hesitate to call.     Sincerely,    [facility]

## 2021-12-01 ENCOUNTER — Telehealth: Payer: Self-pay

## 2021-12-01 NOTE — Telephone Encounter (Signed)
Outpatient Oncology Nutrition Note  [name]    Philippa Vessey   66 y.o. female     DOB: 12-Feb-1955  MRN: [id number]    RD received nutrition referral and phone call/voicemail from patient for nutrition counseling in setting of rectal cancer. RD reached out to patient by phone to set up appointment, but patient unavailable at time of call. RD left voicemail as well as sent e-mail to patient requesting return call/e-mail at patient's convenience to set up appointment. RD will plan to schedule appointment once patient returns call/e-mail.    Clelia Schaumann, RD-AP, CSO, CNSC  Oncology Dietitian Clinical Specialist  [facility]  [facility]  [email or web address]  [phone]

## 2021-12-04 ENCOUNTER — Inpatient Hospital Stay
Admission: EM | Admit: 2021-12-04 | Discharge: 2021-12-06 | DRG: 390 | Disposition: A | Discharge status: Home / Self Care | Payer: Medicare Other | Attending: Internal Medicine | Admitting: Internal Medicine

## 2021-12-04 DIAGNOSIS — K8020 Calculus of gallbladder without cholecystitis without obstruction: Secondary | ICD-10-CM | POA: Diagnosis present

## 2021-12-04 DIAGNOSIS — R109 Unspecified abdominal pain: Secondary | ICD-10-CM

## 2021-12-04 DIAGNOSIS — K565 Intestinal adhesions [bands], unspecified as to partial versus complete obstruction: Principal | ICD-10-CM | POA: Diagnosis present

## 2021-12-04 DIAGNOSIS — Z9049 Acquired absence of other specified parts of digestive tract: Secondary | ICD-10-CM

## 2021-12-04 DIAGNOSIS — K56609 Unspecified intestinal obstruction, unspecified as to partial versus complete obstruction: Principal | ICD-10-CM

## 2021-12-04 DIAGNOSIS — Z85048 Personal history of other malignant neoplasm of rectum, rectosigmoid junction, and anus: Secondary | ICD-10-CM

## 2021-12-04 DIAGNOSIS — Z79899 Other long term (current) drug therapy: Secondary | ICD-10-CM

## 2021-12-04 HISTORY — DX: Unspecified intestinal obstruction, unspecified as to partial versus complete obstruction: K56.609

## 2021-12-04 LAB — URINALYSIS WITH REFLEX TO MICROSCOPIC EXAM - REFLEX TO CULTURE
Bilirubin, UA: NEGATIVE
Blood, UA: NEGATIVE
Glucose, UA: NEGATIVE
Nitrite, UA: NEGATIVE
Protein, UR: NEGATIVE
Specific Gravity UA: 1.005 (ref 1.001–1.035)
Urine pH: 6 (ref 5.0–8.0)
Urobilinogen, UA: NEGATIVE mg/dL (ref 0.2–2.0)

## 2021-12-04 LAB — CBC AND DIFFERENTIAL
Absolute NRBC: 0 10*3/uL (ref 0.00–0.00)
Basophils Absolute Automated: 0.08 10*3/uL (ref 0.00–0.08)
Basophils Automated: 0.8 %
Eosinophils Absolute Automated: 0.07 10*3/uL (ref 0.00–0.44)
Eosinophils Automated: 0.7 %
Hematocrit: 37.8 % (ref 34.7–43.7)
Hgb: 12.6 g/dL (ref 11.4–14.8)
Immature Granulocytes Absolute: 0.03 10*3/uL (ref 0.00–0.07)
Immature Granulocytes: 0.3 %
Instrument Absolute Neutrophil Count: 8.12 10*3/uL — ABNORMAL HIGH (ref 1.10–6.33)
Lymphocytes Absolute Automated: 0.82 10*3/uL (ref 0.42–3.22)
Lymphocytes Automated: 8.5 %
MCH: 29.5 pg (ref 25.1–33.5)
MCHC: 33.3 g/dL (ref 31.5–35.8)
MCV: 88.5 fL (ref 78.0–96.0)
MPV: 10.5 fL (ref 8.9–12.5)
Monocytes Absolute Automated: 0.57 10*3/uL (ref 0.21–0.85)
Monocytes: 5.9 %
Neutrophils Absolute: 8.12 10*3/uL — ABNORMAL HIGH (ref 1.10–6.33)
Neutrophils: 83.8 %
Nucleated RBC: 0 /100 WBC (ref 0.0–0.0)
Platelets: 239 10*3/uL (ref 142–346)
RBC: 4.27 10*6/uL (ref 3.90–5.10)
RDW: 13 % (ref 11–15)
WBC: 9.69 10*3/uL — ABNORMAL HIGH (ref 3.10–9.50)

## 2021-12-04 LAB — COMPREHENSIVE METABOLIC PANEL
ALT: 15 U/L (ref 0–55)
AST (SGOT): 23 U/L (ref 5–41)
Albumin/Globulin Ratio: 1.5 (ref 0.9–2.2)
Albumin: 4.5 g/dL (ref 3.5–5.0)
Alkaline Phosphatase: 85 U/L (ref 37–117)
Anion Gap: 13 (ref 5.0–15.0)
BUN: 17 mg/dL (ref 7.0–21.0)
Bilirubin, Total: 0.8 mg/dL (ref 0.2–1.2)
CO2: 26 mEq/L (ref 17–29)
Calcium: 10.1 mg/dL (ref 8.5–10.5)
Chloride: 105 mEq/L (ref 99–111)
Creatinine: 0.8 mg/dL (ref 0.4–1.0)
Globulin: 3.1 g/dL (ref 2.0–3.6)
Glucose: 114 mg/dL — ABNORMAL HIGH (ref 70–100)
Potassium: 3.8 mEq/L (ref 3.5–5.3)
Protein, Total: 7.6 g/dL (ref 6.0–8.3)
Sodium: 144 mEq/L (ref 135–145)
eGFR: >60 mL/min/{1.73_m2} (ref >=60)

## 2021-12-04 LAB — LACTIC ACID: Lactic Acid: 0.9 mmol/L (ref 0.2–2.0)

## 2021-12-04 LAB — LIPASE: Lipase: 15 U/L (ref 8–78)

## 2021-12-04 MED ORDER — ONDANSETRON HCL 4 MG/2ML IJ SOLN
4.0000 mg | Freq: Once | INTRAMUSCULAR | Status: AC
Start: 2021-12-04 — End: 2021-12-04
  Administered 2021-12-04: 4 mg via INTRAVENOUS
  Filled 2021-12-04: qty 2

## 2021-12-04 MED ORDER — SODIUM CHLORIDE 0.9 % IV BOLUS
1000.0000 mL | Freq: Once | INTRAVENOUS | Status: AC
Start: 2021-12-04 — End: 2021-12-05
  Administered 2021-12-04: 1000 mL via INTRAVENOUS

## 2021-12-04 NOTE — ED Triage Notes (Signed)
[facility] EMERGENCY DEPARTMENT  Provider in Triage Note        Patient Name: Aimee Watson    Chief Complaint:   Chief Complaint   Patient presents with    Abdominal Pain       HPI: Aimee Watson is a 66 y.o. female, PMH rectal adenocarcinoma, SBO who has had a rapid medical screening evaluation initiated by myself. Presents for RUQ abdominal pain to site of prior surgery that began this morning with associated nausea, nbnb emesis, dizziness, chills,and nb diarrhea. Patient describes pain as cramping in nature. Unsure if she has passed gas today. Ate a piece of chicken at 2 pm. Reports diarrhea has been more frequent recently. Denies fever, urinary sx, recent travel. Hx of ileostomy and colectomy.     Medical/Surgical/Social history: as per HPI    Vitals: BP 137/75   Pulse 75   Temp 98.6 F (37 C) (Oral)   Resp 16   Wt 57.6 kg   SpO2 100%   BMI 22.50 kg/m     Pertinent brief exam:   Examination of area of concern: RUQ TTP, no BS auscultated     Preliminary orders: labs, ct abd/pelv    Symptom based preliminary diagnosis/MDM: abdominal pain    Patient advised to remain in the ED until further evaluation can be performed. Patient instructed to notify staff of any changes in condition while waiting.  This assessment is an initial evaluation to expedite care.

## 2021-12-04 NOTE — ED Triage Notes (Signed)
Pt with c/o RUQ abdominal pain that began this morning but increased in pain around 1400. Pt states emesis, dizziness, chills. Pt denies nausea, diarrhea, fevers, CP, SOB. Pt states pain is 6/10. Pt denies taking any medication prior to arrival.

## 2021-12-05 ENCOUNTER — Inpatient Hospital Stay: Payer: Medicare Other

## 2021-12-05 ENCOUNTER — Telehealth: Payer: Self-pay | Admitting: Student in an Organized Health Care Education/Training Program

## 2021-12-05 ENCOUNTER — Emergency Department: Payer: Medicare Other

## 2021-12-05 DIAGNOSIS — K56609 Unspecified intestinal obstruction, unspecified as to partial versus complete obstruction: Secondary | ICD-10-CM

## 2021-12-05 LAB — ECG 12-LEAD
Atrial Rate: 88 {beats}/min
IHS MUSE NARRATIVE AND IMPRESSION: NORMAL
P Axis: 63 degrees
P-R Interval: 144 ms
Q-T Interval: 360 ms
QRS Duration: 62 ms
QTC Calculation (Bezet): 435 ms
R Axis: 38 degrees
T Axis: 38 degrees
Ventricular Rate: 88 {beats}/min

## 2021-12-05 MED ORDER — POTASSIUM CHLORIDE 10 MEQ/100ML IV SOLN
10.0000 meq | INTRAVENOUS | Status: DC | PRN
Start: 2021-12-05 — End: 2021-12-06

## 2021-12-05 MED ORDER — GLUCAGON 1 MG IJ SOLR (WRAP)
1.0000 mg | INTRAMUSCULAR | Status: DC | PRN
Start: 2021-12-05 — End: 2021-12-06

## 2021-12-05 MED ORDER — FAMOTIDINE 10 MG/ML IV SOLN (WRAP)
20.0000 mg | Freq: Two times a day (BID) | INTRAVENOUS | Status: DC
Start: 2021-12-05 — End: 2021-12-06
  Administered 2021-12-05 – 2021-12-06 (×3): 20 mg via INTRAVENOUS
  Filled 2021-12-05 (×3): qty 2

## 2021-12-05 MED ORDER — DEXTROSE 10 % IV BOLUS
12.5000 g | INTRAVENOUS | Status: DC | PRN
Start: 2021-12-05 — End: 2021-12-06

## 2021-12-05 MED ORDER — GLUCOSE 40 % PO GEL (WRAP)
15.0000 g | ORAL | Status: DC | PRN
Start: 2021-12-05 — End: 2021-12-06

## 2021-12-05 MED ORDER — MAGNESIUM SULFATE IN D5W 1-5 GM/100ML-% IV SOLN
1.0000 g | INTRAVENOUS | Status: DC | PRN
Start: 2021-12-05 — End: 2021-12-06

## 2021-12-05 MED ORDER — POTASSIUM & SODIUM PHOSPHATES 280-160-250 MG PO PACK
2.0000 | PACK | ORAL | Status: DC | PRN
Start: 2021-12-05 — End: 2021-12-06

## 2021-12-05 MED ORDER — IOHEXOL 350 MG/ML IV SOLN
100.0000 mL | Freq: Once | INTRAVENOUS | Status: AC | PRN
Start: 2021-12-05 — End: 2021-12-05
  Administered 2021-12-05: 100 mL via INTRAVENOUS

## 2021-12-05 MED ORDER — SODIUM CHLORIDE 0.9 % IV SOLN
INTRAVENOUS | Status: DC
Start: 2021-12-05 — End: 2021-12-06

## 2021-12-05 MED ORDER — NALOXONE HCL 0.4 MG/ML IJ SOLN (WRAP)
0.2000 mg | INTRAMUSCULAR | Status: DC | PRN
Start: 2021-12-05 — End: 2021-12-06

## 2021-12-05 MED ORDER — MELATONIN 3 MG PO TABS
3.0000 mg | ORAL_TABLET | Freq: Every evening | ORAL | Status: DC | PRN
Start: 2021-12-05 — End: 2021-12-06

## 2021-12-05 MED ORDER — DEXTROSE 50 % IV SOLN
12.5000 g | INTRAVENOUS | Status: DC | PRN
Start: 2021-12-05 — End: 2021-12-06

## 2021-12-05 MED ORDER — ACETAMINOPHEN 160 MG/5ML PO SUSP/SOLN (WRAP)
650.0000 mg | Freq: Four times a day (QID) | ORAL | Status: DC | PRN
Start: 2021-12-05 — End: 2021-12-06
  Administered 2021-12-05: 650 mg via ORAL
  Filled 2021-12-05: qty 20.3

## 2021-12-05 MED ORDER — ENOXAPARIN SODIUM 40 MG/0.4ML IJ SOSY
40.0000 mg | PREFILLED_SYRINGE | Freq: Every day | INTRAMUSCULAR | Status: DC
Start: 2021-12-05 — End: 2021-12-06
  Administered 2021-12-05 – 2021-12-06 (×2): 40 mg via SUBCUTANEOUS
  Filled 2021-12-05 (×2): qty 0.4

## 2021-12-05 MED ORDER — POTASSIUM CHLORIDE CRYS ER 20 MEQ PO TBCR
0.0000 meq | EXTENDED_RELEASE_TABLET | ORAL | Status: DC | PRN
Start: 2021-12-05 — End: 2021-12-06

## 2021-12-05 MED ORDER — ONDANSETRON HCL 4 MG/2ML IJ SOLN
4.0000 mg | Freq: Three times a day (TID) | INTRAMUSCULAR | Status: DC | PRN
Start: 2021-12-05 — End: 2021-12-06

## 2021-12-05 NOTE — H&P (Signed)
[facility]      Patient: Aimee Watson  Date: 12/04/2021   DOB: 1955-07-08  Date of Admission: 12/04/2021   MRN: [id number]  Attending: Estrella Deeds, NP         Chief Complaint   Patient presents with    Abdominal Pain      History Gathered From: The patient    HISTORY AND PHYSICAL     Aimee Watson is a 66 y.o. female with a PMHx significant for including rectal adenocarcinoma (chemotherapy) s/p hemicolectomy with ileostomy reversal in February of this year who returned to the ED tonight complaining of RUQ cramping, increased diarrhea and nausea.  While she was in the ED, she  noted that she had a hard object on her abdominal wall just right of her umbilicus that has since disappeared.  Interestingly, the cramping pain is now across her lower abdomen in the suprapubic area and no longer in the RUQ.  She had no fever, chills, malaise or GU complaints. She was recently hospitalized here 9/10 - 9/12 for a high-grade SBO that resolved with conservative management and saw her outpatient gastroenterologist (Dr. Alcide Evener) on 10/17 for increased loose stools.      Labs in the ED tonight a largely normal/within normal range    CT of the abdomen and pelvis suspicious for low-grade SBO.  Cholelithiasis without cholecystitis    Past Medical History:   Diagnosis Date    Rectal adenocarcinoma     Small bowel obstruction        Past Surgical History:   Procedure Laterality Date    ROBOTIC, COLECTOMY, LAR  10/21/2020    [place]KED[place] ILEOSTOMY  03/24/2021       Prior to Admission medications    Medication Sig Start Date End Date Taking? Authorizing Provider   ascorbic acid (VITAMIN C) 250 MG tablet Take 1 tablet (250 mg) by mouth   Yes [provider]   Cholecalciferol (Vitamin D3) 2000 UNIT capsule Take 1 capsule (2,000 Units) by mouth   Yes [provider]   L-Lysine HCl 500 MG Tab Take by mouth every evening   Yes [provider]   loperamide (IMODIUM A-D) 2 MG tablet Take 1 tablet  (2 mg) by mouth 4 (four) times daily as needed   Yes [provider]   Multiple Vitamins-Minerals (Multi Vitamin/Minerals) Tab Take 1 tablet by mouth   Yes [provider]   omeprazole (PriLOSEC) 20 MG capsule Take 1 capsule (20 mg) by mouth   Yes [provider]   Potassium 99 MG Tab Take 1 tablet (99 mg) by mouth   Yes [provider]   diphenoxylate-atropine (LOMOTIL) 2.5-0.025 MG per tablet TAKE 1 TABLET BY MOUTH FOUR TIMES DAILY AS NEEDED FOR DIARRHEA 09/02/21   [provider]       Allergies   Allergen Reactions    Penicillins Rash       History reviewed. No pertinent family history.    Social History     Tobacco Use    Smoking status: Never    Smokeless tobacco: Never   Vaping Use    Vaping Use: Never used   Substance Use Topics    Alcohol use: Yes     Comment: 1-2 a month of wine    Drug use: Never       REVIEW OF SYSTEMS   Positive for: RUQ cramping, diarrhea and nausea  Negative for: no fever, chills, malaise or GU complaints  s    All ROS completed and otherwise negative.    PHYSICAL EXAM     Vital Signs (most recent): BP 135/75   Pulse 82   Temp 98.1 F (36.7 C) (Oral)   Resp 18   Wt 57.6 kg (127 lb)   SpO2 99%   BMI 22.50 kg/m   Constitutional: No apparent distress. Patient speaks freely in full sentences.   HEENT: NC/AT, PERRL, no scleral icterus or conjunctival pallor, no nasal discharge, MMM, oropharynx without erythema or exudate  Neck: trachea midline, supple, no cervical or supraclavicular lymphadenopathy or masses  Cardiovascular: RRR, normal S1 S2, no murmurs, gallops, palpable thrills, no JVD, Non-displaced PMI.  Respiratory: Normal rate. No retractions or increased work of breathing. Clear to auscultation and percussion bilaterally.  Gastrointestinal: Surgical scars on anterior abdomen, no masses, hernias or pulsatile masses.  +BS, non-distended, soft, non-tender, no rebound or guarding, no hepatosplenomegaly  Genitourinary: no suprapubic or  costovertebral angle tenderness  Musculoskeletal: ROM and motor strength grossly normal. No clubbing, edema, or cyanosis. DP and radial pulses 2+ and symmetric.  Neurologic: EOMI, CN 2-12 grossly intact. no gross motor or sensory deficits  Psychiatric: AAOx3, affect and mood appropriate. The patient is alert, interactive, appropriate.      Exam done by Estrella Deeds, NP on 12/05/21 at 4:24 AM      LABS & IMAGING     Recent Results (from the past 24 hour(s))   Comprehensive metabolic panel    Collection Time: 12/04/21  7:09 PM   Result Value Ref Range    Glucose 114 (H) 70 - 100 mg/dL    BUN 16.1 7.0 - 09.6 mg/dL    Creatinine 0.8 0.4 - 1.0 mg/dL    Sodium 045 409 - 811 mEq/L    Potassium 3.8 3.5 - 5.3 mEq/L    Chloride 105 99 - 111 mEq/L    CO2 26 17 - 29 mEq/L    Calcium 10.1 8.5 - 10.5 mg/dL    Protein, Total 7.6 6.0 - 8.3 g/dL    Albumin 4.5 3.5 - 5.0 g/dL    AST (SGOT) 23 5 - 41 U/L    ALT 15 0 - 55 U/L    Alkaline Phosphatase 85 37 - 117 U/L    Bilirubin, Total 0.8 0.2 - 1.2 mg/dL    Globulin 3.1 2.0 - 3.6 g/dL    Albumin/Globulin Ratio 1.5 0.9 - 2.2    Anion Gap 13.0 5.0 - 15.0    eGFR >60.0 >=60 mL/min/1.73 m2   Lipase    Collection Time: 12/04/21  7:09 PM   Result Value Ref Range    Lipase 15 8 - 78 U/L   CBC and differential    Collection Time: 12/04/21  7:09 PM   Result Value Ref Range    WBC 9.69 (H) 3.10 - 9.50 x10 3/uL    Hgb 12.6 11.4 - 14.8 g/dL    Hematocrit 91.4 78.2 - 43.7 %    Platelets 239 142 - 346 x10 3/uL    RBC 4.27 3.90 - 5.10 x10 6/uL    MCV 88.5 78.0 - 96.0 fL    MCH 29.5 25.1 - 33.5 pg    MCHC 33.3 31.5 - 35.8 g/dL    RDW 13 11 - 15 %    MPV 10.5 8.9 - 12.5 fL    Instrument Absolute Neutrophil Count 8.12 (H) 1.10 - 6.33 x10 3/uL    Neutrophils 83.8 None %    Lymphocytes  Automated 8.5 None %    Monocytes 5.9 None %    Eosinophils Automated 0.7 None %    Basophils Automated 0.8 None %    Immature Granulocytes 0.3 None %    Nucleated RBC 0.0 0.0 - 0.0 /100 WBC    Neutrophils Absolute  8.12 (H) 1.10 - 6.33 x10 3/uL    Lymphocytes Absolute Automated 0.82 0.42 - 3.22 x10 3/uL    Monocytes Absolute Automated 0.57 0.21 - 0.85 x10 3/uL    Eosinophils Absolute Automated 0.07 0.00 - 0.44 x10 3/uL    Basophils Absolute Automated 0.08 0.00 - 0.08 x10 3/uL    Immature Granulocytes Absolute 0.03 0.00 - 0.07 x10 3/uL    Absolute NRBC 0.00 0.00 - 0.00 x10 3/uL   Urinalysis Reflex to Microscopic Exam- Reflex to Culture    Collection Time: 12/04/21  7:33 PM   Result Value Ref Range    Urine Type Urine, Clean Ca     Color, UA Straw Colorless - Yellow    Clarity, UA Clear Clear - Hazy    Specific Gravity UA 1.005 1.001 - 1.035    Urine pH 6.0 5.0 - 8.0    Leukocyte Esterase, UA Small (A) Negative    Nitrite, UA Negative Negative    Protein, UR Negative Negative    Glucose, UA Negative Negative    Ketones UA Trace (A) Negative    Urobilinogen, UA Negative 0.2 - 2.0 mg/dL    Bilirubin, UA Negative Negative    Blood, UA Negative Negative    RBC, UA 0 - 2 0 - 5 /hpf    WBC, UA 0 - 5 0 - 5 /hpf   Lactic Acid    Collection Time: 12/04/21 10:37 PM   Result Value Ref Range    Lactic Acid 0.9 0.2 - 2.0 mmol/L       MICROBIOLOGY:  Blood Culture: Not indicated  Urine Culture: Not indicated  Antibiotics Started: Not indicated    IMAGING:  Upon my review: See HPI    CARDIAC:  EKG Interpretation (upon my review): See HPI    Markers:        EMERGENCY DEPARTMENT COURSE:  Orders Placed This Encounter   Procedures    Stool Cryptosporidium / Giardia Antigen    CT Abd/Pelvis with IV and PO Contrast    Comprehensive metabolic panel    Lipase    CBC and differential    Urinalysis Reflex to Microscopic Exam- Reflex to Culture    Lactic Acid    Diet clear liquid    Vital signs    Pulse Oximetry    Progressive Mobility Protocol    Notify physician    NSG Communication: Glucose POCT order (PRN hypoglycemia)    I/O    Height    Weight    Skin assessment    Notify physician (Critical Blood Glucose Value)    Notify physician (Communication:  Document Abnormal Blood Glucose)    NSG Communication: Glucose POCT order (PRN hypoglycemia)    NSG Communication: Adult Hypoglycemia Treatment Algorithm    Place sequential compression device    Maintain sequential compression device    Education: Activity    Education: Disease Process & Condition    Education: Pain Management    Education: Falls Risk    Education: Smoking Cessation    Full Code    ECG 12 lead    Saline lock IV    Saline lock IV    Admit to Observation  Admit to Inpatient       ASSESSMENT & PLAN     Aimee Watson is a 66 y.o. female being admitted for small bowel obstruction.  She has a significant abdominal surgical history and was recently hospitalized here for small bowel obstruction.  She was able to finish the oral contrast without vomiting, she passed some stool and flatus while in the ED and currently feels fairly well.  She requested that we hold off on NG tube for now    Recurrent small bowel obstruction  Rectal adenocarcinoma  s/p hemicolectomy with ileostomy takedown  Concern for abdominal wall hernia?  Admit to Med Surg  Vital signs per unit protocol  Supplemental oxygen to maintain SPO2 > 95%  NS 100 cc/HR  Add H2 blocker  Patient refusing NGT at present.  Given nontoxic appearance, will hold off for now  Clear liquid diet for now advance as tolerated  Will need surgical consult  Defer additional imaging for now      Nutrition  Liquid diet            DVT/VTE Prophylaxis:  Enoxaparin    Code Status: Full Code    Anticipated medical stability for discharge: 3 to 5 days    @patientclass @      Signed,  Estrella Deeds, NP    12/05/2021 4:24 AM  Time Elapsed: 45 min

## 2021-12-05 NOTE — Plan of Care (Signed)
NURSING SHIFT NOTE     Patient: Aimee Watson  Day: 0      SHIFT EVENTS     Shift Narrative/Significant Events (PRN med administration, fall, RRT, etc.):     Pt arrived from the ED at 0400. A&Ox4, independent and ambulates with a steady gait. Pain of 4/10 and pt declined pain med. Oriented to room and use of call light. Scattered scars on abd. Pt denied nausea. Per lab, 3 stool tests were cancelled because the stool sample that was sent in the ED was formed. Safety and fall precautions remain in place. Purposeful rounding completed.          ASSESSMENT     Changes in assessment from patient's baseline this shift:    Neuro: No  CV: No  Pulm: No  Peripheral Vascular: No  HEENT: No  GI: No  BM during shift: No, Last BM: Last BM Date: 12/05/21  GU: No   Integ: No  MS: No    Pain: Improved  Pain Interventions: Rest      Mobility: PMP Activity: Step 6 - Walks in Room of Distance Walked (ft) (Step 6,7): 40 Feet           Lines     Patient Lines/Drains/Airways Status       Active Lines, Drains and Airways       Name Placement date Placement time Site Days    Peripheral IV 12/04/21 20 G Left Antecubital 12/04/21  2054  Antecubital  less than 1                         VITAL SIGNS     Vitals:    12/05/21 0357   BP: 135/75   Pulse: 82   Resp: 18   Temp: 98.1 F (36.7 C)   SpO2: 99%       Temp  Min: 97.8 F (36.6 C)  Max: 98.6 F (37 C)  Pulse  Min: 71  Max: 83  Resp  Min: 14  Max: 21  BP  Min: 123/58  Max: 149/75  SpO2  Min: 98 %  Max: 100 %    No intake or output data in the 24 hours ending 12/05/21 0640           CARE PLAN        Problem: Safety  Goal: Patient will be free from injury during hospitalization  Outcome: Progressing  Flowsheets (Taken 12/05/2021 [phone])  Patient will be free from injury during hospitalization:   Assess patient's risk for falls and implement fall prevention plan of care per policy   Provide and maintain safe environment   Use appropriate transfer methods   Ensure appropriate safety devices  are available at the bedside   Hourly rounding   Include patient/ family/ care giver in decisions related to safety  Goal: Patient will be free from infection during hospitalization  Outcome: Progressing     Problem: Pain  Goal: Pain at adequate level as identified by patient  Outcome: Progressing  Flowsheets (Taken 12/05/2021 0639)  Pain at adequate level as identified by patient:   Identify patient comfort function goal   Assess pain on admission, during daily assessment and/or before any "as needed" intervention(s)     Problem: Fluid and Electrolyte Imbalance/ Endocrine  Goal: Fluid and electrolyte balance are achieved/maintained  Outcome: Progressing  Flowsheets (Taken 12/05/2021 0639)  Fluid and electrolyte balance are achieved/maintained:   Monitor intake and output  every shift   Monitor/assess lab values and report abnormal values   Provide adequate hydration   Assess and reassess fluid and electrolyte status  Goal: Adequate hydration  Outcome: Progressing     Problem: Altered GI Function  Goal: Fluid and electrolyte balance are achieved/maintained  Outcome: Progressing  Flowsheets (Taken 12/05/2021 [phone])  Fluid and electrolyte balance are achieved/maintained:   Monitor intake and output every shift   Monitor/assess lab values and report abnormal values   Provide adequate hydration   Assess and reassess fluid and electrolyte status  Goal: Elimination patterns are normal or improving  Outcome: Progressing  Flowsheets (Taken 12/05/2021 [phone])  Elimination patterns are normal or improving:   Assess for normal bowel sounds   Monitor for abdominal distension   Monitor for abdominal discomfort  Goal: Nutritional intake is adequate  Outcome: Progressing  Goal: Mobility/Activity is maintained at optimal level for patient  Outcome: Progressing

## 2021-12-05 NOTE — UM Notes (Signed)
PATIENT NAME: Aimee Watson, Aimee Watson   DOB: 1955-09-20   PMH:  has a past medical history of Rectal adenocarcinoma and Small bowel obstruction.  PSH:  has a past surgical history that includes ROBOTIC, COLECTOMY, LAR (10/21/2020) and TAKEDOWN ILEOSTOMY (03/24/2021).     Admission Order:   12/05/21 0252  Admit to Observation  Once        Diagnosis: Sbo (Small Bowel Obstruction)  Level of Care: Acute  Patient Class: Observation          12/05/21 0301  Admit to Inpatient  Once        Diagnosis: Sbo (Small Bowel Obstruction)  Level of Care: Acute  Patient Class: Inpatient           ADMISSION REVIEW :   History of present illness: Pt is a 66 y.o. female arrived at [facility] on (12/04/2021 at 2337). PMH rectal adenocarcinoma, SBO who has had a rapid medical screening evaluation initiated by myself. Presents for RUQ abdominal pain to site of prior surgery that began this morning with associated nausea, nbnb emesis, dizziness, chills,and nb diarrhea. Patient describes pain as cramping in nature. Unsure if she has passed gas today. Ate a piece of chicken at 2 pm. Reports diarrhea has been more frequent recently. Denies fever, urinary sx, recent travel. Hx of ileostomy and colectomy.       Arrival VS: Vitals BP: 137/75, Temp: 98.6 F (37 C), Temp Source: Oral, Heart Rate: 75, Resp Rate: 16, SpO2: 100 %, Height: 160 cm (5\' 3" ), Weight: 57.6 kg (127 lb)      Abnormal Labs:  12/04/21 19:09  WBC: 9.69 (H)  Instrument Absolute Neutrophil Count: 8.12 (H)  Neutrophils Absolute: 8.12 (H)  Glucose: 114 (H)    Diagnostics:    CT AP  1.Mildly dilated and fluid-filled loops of distal small bowel likely with  transition point distally within or close to the ileocecal valve. There is  diluted contrast proximally within the proximal colon. Findings are most  suspicious for low-grade small bowel obstruction. No free intraperitoneal  air or fluid.  2.Cholelithiasis without CT evidence of acute cholecystitis.    ED Medications:  1L NS bolus  IV zofran  x1    Admit to inpatient status, Dx  SBO (small bowel obstruction) [K56.609], [facility]    Plan:  Recurrent small bowel obstruction  Rectal adenocarcinoma  s/p hemicolectomy with ileostomy takedown  Concern for abdominal wall hernia?  Admit to Med Surg  Vital signs per unit protocol  Supplemental oxygen to maintain SPO2 > 95%  NS 100 cc/HR  Add H2 blocker  Patient refusing NGT at present.  Given nontoxic appearance, will hold off for now  Clear liquid diet for now advance as tolerated  Will need surgical consult  Defer additional imaging for now     DAY 2 Continue stay review:  CSR Date: 12/05/2021 [facility]        O2: 99% RA  Diet: CLD  VS:  I&O   Temp:  [97.8 F (36.6 C)-98.6 F (37 C)]   Heart Rate:  [71-83]   Resp Rate:  [14-21]   BP: (123-149)/(58-75)   SpO2:  [98 %-100 %]   Height:  [160 cm (5\' 3" )]   Weight:  [56.8 kg (125 lb 3.5 oz)-57.6 kg (127 lb)]   BMI (calculated):  [22.2]    No intake/output data recorded.         Scheduled Meds:  Current Facility-Administered Medications  Medication Dose Route Frequency    enoxaparin  40 mg Subcutaneous Daily    famotidine  20 mg Intravenous Q12H [facility]     Continuous Infusions:   sodium chloride 100 mL/hr at 12/05/21 0543         A/P:  - repeat KUB midday today to assess PO contrast progression from CT  - no need for NGT at this time  - encourage OOB, ambulation  - clears, NPO if any emesis        Primary Payor: MEDICARE / Plan: MEDICARE PART A AND B / Product Type: Medicare /   UTILIZATION REVIEW CONTACT: Name: Magda Bernheim, MSN, RN  Utilization Review   Phone: [phone]  Fax: [phone]  Please use fax number [phone] to provide authorization for hospital services or to request additional information.        NOTES TO REVIEWER:    This clinical review is based on/compiled from documentation provided by the treatment team within the patient's medical record.

## 2021-12-05 NOTE — ED Provider Notes (Signed)
EMERGENCY DEPARTMENT HISTORY AND PHYSICAL EXAM     None        Date: 12/04/2021  Patient Name: Aimee Watson    History of Presenting Illness and Plan     Chief Complaint   Patient presents with    Abdominal Pain       History Provided By: Patient    HPI:     HPI documented within ED course below.       Associated symptoms and pertinent negative listed in ROS.    PCP: Pcp, None, MD  SPECIALISTS:    Current Facility-Administered Medications   Medication Dose Route Frequency Provider Last Rate Last Admin    acetaminophen (TYLENOL) 160 MG/5ML oral liquid 650 mg  650 mg Oral Q6H PRN Estrella Deeds, NP        dextrose (GLUCOSE) 40 % oral gel 15 g of glucose  15 g of glucose Oral PRN Estrella Deeds, NP        Or    dextrose (D10W) 10% bolus 125 mL  12.5 g Intravenous PRN Estrella Deeds, NP        Or    dextrose 50 % bolus 12.5 g  12.5 g Intravenous PRN Estrella Deeds, NP        Or    glucagon (rDNA) (GLUCAGEN) injection 1 mg  1 mg Intramuscular PRN Estrella Deeds, NP        enoxaparin (LOVENOX) syringe 40 mg  40 mg Subcutaneous Daily Estrella Deeds, NP        famotidine (PEPCID) injection 20 mg  20 mg Intravenous Q12H SCH Lucrezio, Despina Arias, NP        magnesium sulfate 1g in dextrose 5% IVPB (premix)  1 g Intravenous PRN Estrella Deeds, NP        melatonin tablet 3 mg  3 mg Oral QHS PRN Estrella Deeds, NP        naloxone [facility]) injection 0.2 mg  0.2 mg Intravenous PRN Estrella Deeds, NP        ondansetron (ZOFRAN) injection 4 mg  4 mg Intravenous Q8H PRN Estrella Deeds, NP        potassium & sodium phosphates (PHOS-NAK) 280-160-250 MG packet 2 packet  2 packet Oral PRN Estrella Deeds, NP        potassium chloride (KLOR-CON M20) CR tablet 0-40 mEq  0-40 mEq Oral PRN Estrella Deeds, NP        And    potassium chloride 10 mEq in 100 mL IVPB (premix)  10 mEq Intravenous PRN Estrella Deeds, NP           Past History     Past Medical History:  Past  Medical History:   Diagnosis Date    Rectal adenocarcinoma     Small bowel obstruction        Past Surgical History:  Past Surgical History:   Procedure Laterality Date    ROBOTIC, COLECTOMY, LAR  10/21/2020    [place] ILEOSTOMY  03/24/2021       Family History:  History reviewed. No pertinent family history.    Social History:  Social History     Tobacco Use    Smoking status: Never    Smokeless tobacco: Never   Vaping Use    Vaping Use: Never used   Substance Use Topics    Alcohol use: Yes     Comment: 1-2 a month of wine  Drug use: Never       Allergies:  Allergies   Allergen Reactions    Penicillins Rash       Review of Systems     Review of Systems    Physical Exam   BP 135/75   Pulse 82   Temp 98.1 F (36.7 C) (Oral)   Resp 18   Wt 57.6 kg   SpO2 99%   BMI 22.50 kg/m     Physical Exam  Vitals and nursing note reviewed.   Constitutional:       General: She is in acute distress.      Appearance: She is well-developed.   HENT:      Head: Normocephalic and atraumatic.   Eyes:      Pupils: Pupils are equal, round, and reactive to light.   Cardiovascular:      Rate and Rhythm: Normal rate and regular rhythm.      Heart sounds: Normal heart sounds.   Pulmonary:      Effort: Pulmonary effort is normal.      Breath sounds: Normal breath sounds. No wheezing.   Abdominal:      General: There is distension.      Palpations: Abdomen is soft.      Tenderness: There is abdominal tenderness in the right upper quadrant and epigastric area. There is no guarding or rebound.   Musculoskeletal:         General: Normal range of motion.      Cervical back: Normal range of motion and neck supple.   Skin:     General: Skin is warm and dry.   Neurological:      Mental Status: She is alert and oriented to person, place, and time.   Psychiatric:         Behavior: Behavior normal.         Diagnostic Study Results     Labs -     Results       Procedure Component Value Units Date/Time    Stool Cryptosporidium / Giardia Antigen  [[id number]] Collected: 12/04/21 1933    Specimen: Stool Updated: 12/05/21 0428    Narrative:      ORDER#: [id number]                                    ORDERED BY: MAKDISI, CHRIST  SOURCE: Stool stool                                  COLLECTED:  12/04/21 19:33  ANTIBIOTICS AT COLL.:                                RECEIVED :  12/05/21 01:57  Cryptosporidium/Giardia Antigen            FINAL       12/05/21 04:28  12/05/21   Negative for Cryptosporidium parvum antigen             Negative for Giardia lamblia antigen      Lactic Acid [[id number]] Collected: 12/04/21 2237    Specimen: Blood Updated: 12/04/21 2251     Lactic Acid 0.9 mmol/L     Urinalysis Reflex to Microscopic Exam- Reflex to Culture [[id number]]  (Abnormal) Collected: 12/04/21 1933     Updated: 12/04/21 1954  Urine Type Urine, Clean Ca     Color, UA Straw     Clarity, UA Clear     Specific Gravity UA 1.005     Urine pH 6.0     Leukocyte Esterase, UA Small     Nitrite, UA Negative     Protein, UR Negative     Glucose, UA Negative     Ketones UA Trace     Urobilinogen, UA Negative mg/dL      Bilirubin, UA Negative     Blood, UA Negative     RBC, UA 0 - 2 /hpf      WBC, UA 0 - 5 /hpf     Comprehensive metabolic panel [[id number]]  (Abnormal) Collected: 12/04/21 1909    Specimen: Blood Updated: 12/04/21 1936     Glucose 114 mg/dL      BUN 40.9 mg/dL      Creatinine 0.8 mg/dL      Sodium 811 mEq/L      Potassium 3.8 mEq/L      Chloride 105 mEq/L      CO2 26 mEq/L      Calcium 10.1 mg/dL      Protein, Total 7.6 g/dL      Albumin 4.5 g/dL      AST (SGOT) 23 U/L      ALT 15 U/L      Alkaline Phosphatase 85 U/L      Bilirubin, Total 0.8 mg/dL      Globulin 3.1 g/dL      Albumin/Globulin Ratio 1.5     Anion Gap 13.0     eGFR >60.0 mL/min/1.73 m2     Lipase [[id number]] Collected: 12/04/21 1909    Specimen: Blood Updated: 12/04/21 1936     Lipase 15 U/L     CBC and differential [[id number]]  (Abnormal) Collected: 12/04/21 1909    Specimen: Blood Updated: 12/04/21 1921      WBC 9.69 x10 3/uL      Hgb 12.6 g/dL      Hematocrit 46.9 %      Platelets 239 x10 3/uL      RBC 4.27 x10 6/uL      MCV 88.5 fL      MCH 29.5 pg      MCHC 33.3 g/dL      RDW 13 %      MPV 10.5 fL      Instrument Absolute Neutrophil Count 8.12 x10 3/uL      Neutrophils 83.8 %      Lymphocytes Automated 8.5 %      Monocytes 5.9 %      Eosinophils Automated 0.7 %      Basophils Automated 0.8 %      Immature Granulocytes 0.3 %      Nucleated RBC 0.0 /100 WBC      Neutrophils Absolute 8.12 x10 3/uL      Lymphocytes Absolute Automated 0.82 x10 3/uL      Monocytes Absolute Automated 0.57 x10 3/uL      Eosinophils Absolute Automated 0.07 x10 3/uL      Basophils Absolute Automated 0.08 x10 3/uL      Immature Granulocytes Absolute 0.03 x10 3/uL      Absolute NRBC 0.00 x10 3/uL             Radiologic Studies -   Radiology Results (24 Hour)       Procedure Component Value Units Date/Time    CT Abd/Pelvis with IV and PO Contrast [  [id number]] Collected: 12/05/21 0140    Order Status: Completed Updated: 12/05/21 0158    Narrative:      Study: CT ABDOMEN PELVIS W IV AND PO CONT    Date: 12/05/2021 1:37 AM    Reason for exam: RUQ pain, hx of SBO, vomiting, r/o obstruction    Technique: CT of abdomen and pelvis after administration of 100 mL of  Omnipaque 350. Oral contrast was given. Multiplanar reconstructions were  provided.    All CT scans are performed using one of these three dose reduction  techniques: automated exposure control, adjustment of the mA and/or kV  according to patient size, or use of iterative reconstruction techniques.    Comparison:CT abdomen pelvis on 10/18/2021    Findings:  Mildly dilated fluid-filled loops of distal bowel with transition point  appearing distal the within the or close to the ileocecal valve there is  passage of diluted contrast into proximal colon. No free intraperitoneal  air or fluid.    Mild hepatomegaly. Cholelithiasis. There is no pericholecystic fluid  collection or wall thickening  to suggest acute cholecystitis. The spleen  and adrenal glands are normal. Diffuse fatty replacement of the pancreas  without definitive pancreatic ductal dilatation.    The kidneys enhance symmetrically. No hydronephrosis. The urinary bladder  is markedly distended.    No lymphadenopathy by CT size criteria. Numerous nonenlarged mesenteric  lymph nodes are likely reactive. The major abdominopelvic vasculature are  patent and normal in caliber.    No aggressive osseous lesion.    Minimal left basilar atelectasis. Normal size heart without pericardial  effusion.      Impression:        1.Mildly dilated and fluid-filled loops of distal small bowel likely with  transition point distally within or close to the ileocecal valve. There is  diluted contrast proximally within the proximal colon. Findings are most  suspicious for low-grade small bowel obstruction. No free intraperitoneal  air or fluid.  2.Cholelithiasis without CT evidence of acute cholecystitis.    Nelya Ebadirad  12/05/2021 1:56 AM        .    Medical Decision Making   I am the first provider for this patient.    I reviewed the vital signs, available nursing notes, past medical history, past surgical history, family history and social history.      Vital Signs-Reviewed the patient's vital signs.   Patient Vitals for the past 12 hrs:   BP Temp Pulse Resp   12/05/21 0357 135/75 98.1 F (36.7 C) 82 18   12/05/21 0300 149/75 97.8 F (36.6 C) 83 21   12/05/21 0000 123/58 98 F (36.7 C) 71 14   12/04/21 1838 137/75 98.6 F (37 C) 75 16       Pulse Oximetry Analysis - Normal       EKG:  Interpreted by me: Sinus rhythm, rate 88, normal PR, nonspecific T wave abnormality, no STEMI, previous EKG 18 October 2021 no significant ST segment changes.     Labs: All labs have been ordered, reviewed and interpreted by me.  Xrays: Ordered, reviewed and interpreted by me, confirmed by radiology report.   CT/US/MRI: Ordered and reviewed  by me, confirmed by radiology  report.     Critical Care Time:    Procedures      ED Course/ Provider Note/ MDM: :   Initial differential diagnosis to include but not limited to: bowel obstruction, diverticulitis, gastritis, pancreatitis, incarcerated hernia    ED Course  as of 12/05/21 0432   Fri Dec 04, 2021   2236 Patient reports passing gas in waiting room. Started on PO contrast [CM]   Sat Dec 05, 2021   0129 HPI: 66 y.o. female, PMH rectal adenocarcinoma, SBO.  Presents for RUQ abdominal pain to site of prior surgery that began this morning with associated nausea, nbnb emesis x 6 episodes, dizziness, chills,and constipation. Pt had a small BM earlier this AM and later this evening. Patient describes pain as cramping in nature. Unsure if she has passed gas today. Ate a piece of chicken at 2 pm. Reports diarrhea has been more frequent recently. Denies fever, urinary sx, recent travel. Hx of ileostomy and colectomy.  [ST]   0138 Comprehensive metabolic panel(!) [ST]   0138 Lactic Acid [ST]   0138 Lipase [ST]   0138 CBC and differential(!) [ST]   0307 Now having frequent soft stools. Mild lower abdominal TTP. No further vomiting.   Accepted to SOUND servcie, gen med.   No NGT at this time. IVF bolus given.  [ST]      ED Course User Index  [CM] Janeann Forehand [place], [place]  [ST] Cherlyn Roberts, MD     Pt complained of ball like mass above the ileostomy incision when the pain was severe.  She states that that has since been alleviated.  Possible incisional hernia.  No hernia felt on examination.  No rebound or guarding.  Patient clinically appears more comfortable however CT still indicating small bowel obstruction.  Given patient's extensive surgical history, will observe.  Patient accepted to the sound service by Dr. Greig Castilla    Medical Decision Making  Amount and/or Complexity of Data Reviewed  Labs:  Decision-making details documented in ED Course.    Risk  Prescription drug management.  Decision regarding hospitalization.              Dr. Audley Hose is the primary emergency doctor of record.    Diagnosis     Clinical Impression:   1. SBO (small bowel obstruction)        Treatment Plan:   ED Disposition       ED Disposition   Admit    Condition   --    Date/Time   Sat Dec 05, 2021  3:01 AM    Comment   Admitting Physician: Marya Amsler [[id number]]   Service:: Medicine [[id number]]   Estimated Length of Stay: 3 - 5 Days   Tentative Discharge Plan?: Home or Self Care [1]   Does patient need telemetry?: No                   _______________________________      This note was generated by the Epic EMR system/ Dragon speech recognition and may contain inherent errors or omissions not intended by the user. Grammatical errors, random word insertions, deletions and pronoun errors  are occasional consequences of this technology due to software limitations. Not all errors are caught or corrected. If there are questions or concerns about the content of this note or information contained within the body of this dictation they should be addressed directly with the author for clarification. The use of the ED course in this note was to facilitate documentation. The time stamps of the HPI, ROS, physical exam,EKG interpretation within the ED course reflect the time these elements were documented in the chart, not the time they were physically completed.      _______________________________  Cherlyn Roberts, MD  12/05/21 [phone]

## 2021-12-05 NOTE — Consults (Signed)
SURGICAL CONSULTATION  Team General Surgery/[facility], [facility] [phone]    Date Time: 12/05/21 9:36 AM  Patient Name: Aimee Watson  Attending Physician: Lamona Curl, MD  Consulting Physician:  Dr Artis Delay  Reason for consultation: SBO    History of Present Illness:   Aimee Watson is a 66 y.o. female who presents to the hospital with nausea and abdominal pain since last night, pt also notes firm abd wall mass which has since disappeared (was near ileostomy site). PMH /PSH notable for rectal cancer s/p LAR w/ DLI and subsequent reversal. Crampy abd pain started yesterday morning, progressively worsening with nausea starting yesterday afternoon. 5 episodes emesis yesterday afternoon.     Pt now feeling better, starting to pass small amounts of flatus. No BM yet. Last BM yesterday evening.     Past Medical History:     Past Medical History:   Diagnosis Date    Rectal adenocarcinoma     Small bowel obstruction        Past Surgical History:     Past Surgical History:   Procedure Laterality Date    ROBOTIC, COLECTOMY, LAR  10/21/2020    [place] ILEOSTOMY  03/24/2021       Family History:   History reviewed. No pertinent family history.    Social History:     Social History     Socioeconomic History    Marital status: Widowed     Spouse name: Not on file    Number of children: Not on file    Years of education: Not on file    Highest education level: Not on file   Occupational History    Not on file   Tobacco Use    Smoking status: Never    Smokeless tobacco: Never   Vaping Use    Vaping Use: Never used   Substance and Sexual Activity    Alcohol use: Yes     Comment: 1-2 a month of wine    Drug use: Never    Sexual activity: Not on file   Other Topics Concern    Not on file   Social History Narrative    Not on file     Social Determinants of Health     Financial Resource Strain: Low Risk  (11/19/2021)    Overall Financial Resource Strain (CARDIA)     Difficulty of Paying Living Expenses: Not hard at all   Food  Insecurity: No Food Insecurity (11/19/2021)    Hunger Vital Sign     Worried About Running Out of Food in the Last Year: Never true     Ran Out of Food in the Last Year: Never true   Transportation Needs: No Transportation Needs (11/19/2021)    PRAPARE - Therapist, art (Medical): No     Lack of Transportation (Non-Medical): No   Physical Activity: Insufficiently Active (11/19/2021)    Exercise Vital Sign     Days of Exercise per Week: 5 days     Minutes of Exercise per Session: 20 min   Stress: No Stress Concern Present (11/19/2021)    [employer] of Occupational Health - Occupational Stress Questionnaire     Feeling of Stress : Only a little   Social Connections: Moderately Isolated (11/19/2021)    Social Connection and Isolation Panel [NHANES]     Frequency of Communication with Friends and Family: More than three times a week     Frequency of Social Gatherings with Friends and  Family: More than three times a week     Attends Religious Services: 1 to 4 times per year     Active Member of Clubs or Organizations: No     Attends Banker Meetings: Patient refused     Marital Status: Widowed   Intimate Partner Violence: Not At Risk (11/19/2021)    Humiliation, Afraid, Rape, and Kick questionnaire     Fear of Current or Ex-Partner: No     Emotionally Abused: No     Physically Abused: No     Sexually Abused: No   Housing Stability: Low Risk  (11/19/2021)    Housing Stability Vital Sign     Unable to Pay for Housing in the Last Year: No     Number of Places Lived in the Last Year: 2     Unstable Housing in the Last Year: No       Allergies:     Allergies   Allergen Reactions    Penicillins Rash       Medications:     Prior to Admission medications    Medication Sig Start Date End Date Taking? Authorizing Provider   ascorbic acid (VITAMIN C) 250 MG tablet Take 1 tablet (250 mg) by mouth   Yes [provider]   Cholecalciferol (Vitamin D3) 2000 UNIT capsule Take 1  capsule (2,000 Units) by mouth   Yes [provider]   L-Lysine HCl 500 MG Tab Take by mouth every evening   Yes [provider]   loperamide (IMODIUM A-D) 2 MG tablet Take 1 tablet (2 mg) by mouth 4 (four) times daily as needed   Yes [provider]   Multiple Vitamins-Minerals (Multi Vitamin/Minerals) Tab Take 1 tablet by mouth   Yes [provider]   omeprazole (PriLOSEC) 20 MG capsule Take 1 capsule (20 mg) by mouth   Yes [provider]   Potassium 99 MG Tab Take 1 tablet (99 mg) by mouth   Yes [provider]   diphenoxylate-atropine (LOMOTIL) 2.5-0.025 MG per tablet TAKE 1 TABLET BY MOUTH FOUR TIMES DAILY AS NEEDED FOR DIARRHEA 09/02/21   [provider]       Review of Systems:   General ROS: negative  ENT ROS: negative  Cardiovascular ROS: negative  Respiratory ROS: negative  Gastrointestinal ROS: positive for - abdominal pain and nausea/vomiting  Genito-Urinary ROS: negative  Hematological and Lymphatic ROS: negative  Endocrine ROS: negative  Musculoskeletal ROS: negative  Neurological ROS: negative     Physical Exam:     Vitals:    12/05/21 0737   BP: 108/67   Pulse: 73   Resp: 19   Temp: 97.7 F (36.5 C)   SpO2: 100%            Intake and Output Summary (Last 24 hours) at Date Time  No intake or output data in the 24 hours ending 12/05/21 0936    General: no acute distress  HEENT: no scleral icterus, trachea midline  Lungs: non-labored respirations,, on room air  Heart: RRR  Abdomen: soft, non-tender, well-healed prior surgical scars, non-distended   MSK: MAE  Skin: warm and dry, no peripheral edema  Neuro: awake, alert, CN II-XII grossly intact       Labs:     Results       Procedure Component Value Units Date/Time    Stool Cryptosporidium / Giardia Antigen [[id number]] Collected: 12/04/21 1933    Specimen: Stool Updated: 12/05/21 4098  Narrative:      ORDER#: [id number]                                    ORDERED BY: MAKDISI, CHRIST  SOURCE:  Stool stool                                  COLLECTED:  12/04/21 19:33  ANTIBIOTICS AT COLL.:                                RECEIVED :  12/05/21 01:57  Cryptosporidium/Giardia Antigen            FINAL       12/05/21 04:28  12/05/21   Negative for Cryptosporidium parvum antigen             Negative for Giardia lamblia antigen      Lactic Acid [[id number]] Collected: 12/04/21 2237    Specimen: Blood Updated: 12/04/21 2251     Lactic Acid 0.9 mmol/L     Urinalysis Reflex to Microscopic Exam- Reflex to Culture [[id number]]  (Abnormal) Collected: 12/04/21 1933     Updated: 12/04/21 1954     Urine Type Urine, Clean Ca     Color, UA Straw     Clarity, UA Clear     Specific Gravity UA 1.005     Urine pH 6.0     Leukocyte Esterase, UA Small     Nitrite, UA Negative     Protein, UR Negative     Glucose, UA Negative     Ketones UA Trace     Urobilinogen, UA Negative mg/dL      Bilirubin, UA Negative     Blood, UA Negative     RBC, UA 0 - 2 /hpf      WBC, UA 0 - 5 /hpf     Comprehensive metabolic panel [[id number]]  (Abnormal) Collected: 12/04/21 1909    Specimen: Blood Updated: 12/04/21 1936     Glucose 114 mg/dL      BUN 62.9 mg/dL      Creatinine 0.8 mg/dL      Sodium 528 mEq/L      Potassium 3.8 mEq/L      Chloride 105 mEq/L      CO2 26 mEq/L      Calcium 10.1 mg/dL      Protein, Total 7.6 g/dL      Albumin 4.5 g/dL      AST (SGOT) 23 U/L      ALT 15 U/L      Alkaline Phosphatase 85 U/L      Bilirubin, Total 0.8 mg/dL      Globulin 3.1 g/dL      Albumin/Globulin Ratio 1.5     Anion Gap 13.0     eGFR >60.0 mL/min/1.73 m2     Lipase [[id number]] Collected: 12/04/21 1909    Specimen: Blood Updated: 12/04/21 1936     Lipase 15 U/L     CBC and differential [[id number]]  (Abnormal) Collected: 12/04/21 1909    Specimen: Blood Updated: 12/04/21 1921     WBC 9.69 x10 3/uL      Hgb 12.6 g/dL      Hematocrit 03.4 %      Platelets 239 x10 3/uL      RBC 4.27  x10 6/uL      MCV 88.5 fL      MCH 29.5 pg      MCHC 33.3 g/dL      RDW 13 %       MPV 10.5 fL      Instrument Absolute Neutrophil Count 8.12 x10 3/uL      Neutrophils 83.8 %      Lymphocytes Automated 8.5 %      Monocytes 5.9 %      Eosinophils Automated 0.7 %      Basophils Automated 0.8 %      Immature Granulocytes 0.3 %      Nucleated RBC 0.0 /100 WBC      Neutrophils Absolute 8.12 x10 3/uL      Lymphocytes Absolute Automated 0.82 x10 3/uL      Monocytes Absolute Automated 0.57 x10 3/uL      Eosinophils Absolute Automated 0.07 x10 3/uL      Basophils Absolute Automated 0.08 x10 3/uL      Immature Granulocytes Absolute 0.03 x10 3/uL      Absolute NRBC 0.00 x10 3/uL             Rads:     Radiology Results (24 Hour)       Procedure Component Value Units Date/Time    CT Abd/Pelvis with IV and PO Contrast [[id number]] Collected: 12/05/21 0140    Order Status: Completed Updated: 12/05/21 0158    Narrative:      Study: CT ABDOMEN PELVIS W IV AND PO CONT    Date: 12/05/2021 1:37 AM    Reason for exam: RUQ pain, hx of SBO, vomiting, r/o obstruction    Technique: CT of abdomen and pelvis after administration of 100 mL of  Omnipaque 350. Oral contrast was given. Multiplanar reconstructions were  provided.    All CT scans are performed using one of these three dose reduction  techniques: automated exposure control, adjustment of the mA and/or kV  according to patient size, or use of iterative reconstruction techniques.    Comparison:CT abdomen pelvis on 10/18/2021    Findings:  Mildly dilated fluid-filled loops of distal bowel with transition point  appearing distal the within the or close to the ileocecal valve there is  passage of diluted contrast into proximal colon. No free intraperitoneal  air or fluid.    Mild hepatomegaly. Cholelithiasis. There is no pericholecystic fluid  collection or wall thickening to suggest acute cholecystitis. The spleen  and adrenal glands are normal. Diffuse fatty replacement of the pancreas  without definitive pancreatic ductal dilatation.    The kidneys enhance  symmetrically. No hydronephrosis. The urinary bladder  is markedly distended.    No lymphadenopathy by CT size criteria. Numerous nonenlarged mesenteric  lymph nodes are likely reactive. The major abdominopelvic vasculature are  patent and normal in caliber.    No aggressive osseous lesion.    Minimal left basilar atelectasis. Normal size heart without pericardial  effusion.      Impression:        1.Mildly dilated and fluid-filled loops of distal small bowel likely with  transition point distally within or close to the ileocecal valve. There is  diluted contrast proximally within the proximal colon. Findings are most  suspicious for low-grade small bowel obstruction. No free intraperitoneal  air or fluid.  2.Cholelithiasis without CT evidence of acute cholecystitis.    Nelya Ebadirad  12/05/2021 1:56 AM            Problem  List:     Patient Active Problem List   Diagnosis    SBO (small bowel obstruction)    Rectal cancer             Assessment:   66 y.o. female with history rectal cancer s/p LAR with DLI and later reversal. Admitted with nausea, vomtiing, abd pain with dilated small bowel loops on CT although still passing small amounts flatus and BM. Similar episode in Sept 2023 managed w/ NGT decompression    Plan:   - repeat KUB midday today to assess PO contrast progression from CT  - no need for NGT at this time  - encourage OOB, ambulation  - clears, NPO if any emesis   - remainder per primary     Signed by: Lowanda Foster, MD     This patient was personally seen and examined by me and I agree with the assessment and plan above.  All notes, labs, and imaging if performed were reviewed.  The patient was seen on the date the note was written.       Kaveh Kissinger C. Artis Delay, MD, MPH  [facility]  [address]  [place], [place] [address]  [phone]

## 2021-12-05 NOTE — Plan of Care (Addendum)
NURSING SHIFT NOTE     Patient: Aimee Watson  Day: 0      SHIFT EVENTS     Shift Narrative/Significant Events (PRN med administration, fall, RRT, etc.):     Pt alert and oriented. Pt c/o abdominal tenderness but did not require pain medication. Pt on clear liquid diet, pt tolerated it at breakfast time. Pt ambualtory to the bathroom and in hallway. Pt stated that had 3 times small bowel movement and passing gasses. Skin dry and intact.     Safety and fall precautions remain in place. Purposeful rounding completed.          ASSESSMENT     Changes in assessment from patient's baseline this shift:    Neuro: No  CV: No  Pulm: No  Peripheral Vascular: No  HEENT: No  GI: No  BM during shift: No, Last BM: Last BM Date: 12/05/21  GU: No   Integ: No  MS: No    Pain: Improved  Pain Interventions: Rest  Medications Utilized: none given at this time    Mobility: PMP Activity: Step 6 - Walks in Room of [employer] (ft) (Step 6,7): 15 Feet           Lines     Patient Lines/Drains/Airways Status       Active Lines, Drains and Airways       Name Placement date Placement time Site Days    Peripheral IV 12/04/21 20 G Left Antecubital 12/04/21  2054  Antecubital  less than 1                         VITAL SIGNS     Vitals:    12/05/21 0737   BP: 108/67   Pulse: 73   Resp: 19   Temp: 97.7 F (36.5 C)   SpO2: 100%       Temp  Min: 97.7 F (36.5 C)  Max: 98.6 F (37 C)  Pulse  Min: 71  Max: 83  Resp  Min: 14  Max: 21  BP  Min: 108/67  Max: 149/75  SpO2  Min: 98 %  Max: 100 %    No intake or output data in the 24 hours ending 12/05/21 1004           CARE PLAN        Problem: Safety  Goal: Patient will be free from injury during hospitalization  Outcome: Progressing  Flowsheets (Taken 12/05/2021 1001)  Patient will be free from injury during hospitalization:   Assess patient's risk for falls and implement fall prevention plan of care per policy   Provide and maintain safe environment   Ensure appropriate safety devices are  available at the bedside   Use appropriate transfer methods  Goal: Patient will be free from infection during hospitalization  Outcome: Progressing  Flowsheets (Taken 12/05/2021 1001)  Free from Infection during hospitalization:   Assess and monitor for signs and symptoms of infection   Monitor lab/diagnostic results   Monitor all insertion sites (i.e. indwelling lines, tubes, urinary catheters, and drains)     Problem: Safety  Goal: Patient will be free from injury during hospitalization  Outcome: Progressing  Flowsheets (Taken 12/05/2021 1001)  Patient will be free from injury during hospitalization:   Assess patient's risk for falls and implement fall prevention plan of care per policy   Provide and maintain safe environment   Ensure appropriate safety devices are available at the bedside  Use appropriate transfer methods     Problem: Safety  Goal: Patient will be free from infection during hospitalization  Outcome: Progressing  Flowsheets (Taken 12/05/2021 1001)  Free from Infection during hospitalization:   Assess and monitor for signs and symptoms of infection   Monitor lab/diagnostic results   Monitor all insertion sites (i.e. indwelling lines, tubes, urinary catheters, and drains)     Problem: Pain  Goal: Pain at adequate level as identified by patient  Outcome: Progressing  Flowsheets (Taken 12/05/2021 1001)  Pain at adequate level as identified by patient:   Identify patient comfort function goal   Assess for risk of opioid induced respiratory depression, including snoring/sleep apnea. Alert healthcare team of risk factors identified.   Assess pain on admission, during daily assessment and/or before any "as needed" intervention(s)   Reassess pain within 30-60 minutes of any procedure/intervention, per Pain Assessment, Intervention, Reassessment (AIR) Cycle   Evaluate if patient comfort function goal is met     Problem: Pain  Goal: Pain at adequate level as identified by patient  Outcome:  Progressing  Flowsheets (Taken 12/05/2021 1001)  Pain at adequate level as identified by patient:   Identify patient comfort function goal   Assess for risk of opioid induced respiratory depression, including snoring/sleep apnea. Alert healthcare team of risk factors identified.   Assess pain on admission, during daily assessment and/or before any "as needed" intervention(s)   Reassess pain within 30-60 minutes of any procedure/intervention, per Pain Assessment, Intervention, Reassessment (AIR) Cycle   Evaluate if patient comfort function goal is met     Problem: Side Effects from Pain Analgesia  Goal: Patient will experience minimal side effects of analgesic therapy  Outcome: Progressing  Flowsheets (Taken 12/05/2021 1001)  Patient will experience minimal side effects of analgesic therapy:   Monitor/assess patient's respiratory status (RR depth, effort, breath sounds)   Assess for changes in cognitive function   Prevent/manage side effects per LIP orders (i.e. nausea, vomiting, pruritus, constipation, urinary retention, etc.)   Evaluate for opioid-induced sedation with appropriate assessment tool (i.e. POSS)     Problem: Side Effects from Pain Analgesia  Goal: Patient will experience minimal side effects of analgesic therapy  Outcome: Progressing  Flowsheets (Taken 12/05/2021 1001)  Patient will experience minimal side effects of analgesic therapy:   Monitor/assess patient's respiratory status (RR depth, effort, breath sounds)   Assess for changes in cognitive function   Prevent/manage side effects per LIP orders (i.e. nausea, vomiting, pruritus, constipation, urinary retention, etc.)   Evaluate for opioid-induced sedation with appropriate assessment tool (i.e. POSS)     Problem: Fluid and Electrolyte Imbalance/ Endocrine  Goal: Fluid and electrolyte balance are achieved/maintained  Outcome: Progressing  Flowsheets (Taken 12/05/2021 1001)  Fluid and electrolyte balance are achieved/maintained:   Monitor intake and  output every shift   Provide adequate hydration   Assess for confusion/personality changes   Monitor/assess lab values and report abnormal values   Monitor daily weight   Assess and reassess fluid and electrolyte status  Goal: Adequate hydration  Outcome: Progressing  Flowsheets (Taken 12/05/2021 1001)  Adequate hydration: Assess mucus membranes, skin color, turgor, perfusion and presence of edema     Problem: Fluid and Electrolyte Imbalance/ Endocrine  Goal: Fluid and electrolyte balance are achieved/maintained  Outcome: Progressing  Flowsheets (Taken 12/05/2021 1001)  Fluid and electrolyte balance are achieved/maintained:   Monitor intake and output every shift   Provide adequate hydration   Assess for confusion/personality changes   Monitor/assess lab values  and report abnormal values   Monitor daily weight   Assess and reassess fluid and electrolyte status     Problem: Fluid and Electrolyte Imbalance/ Endocrine  Goal: Adequate hydration  Outcome: Progressing  Flowsheets (Taken 12/05/2021 1001)  Adequate hydration: Assess mucus membranes, skin color, turgor, perfusion and presence of edema     Problem: Altered GI Function  Goal: Fluid and electrolyte balance are achieved/maintained  Outcome: Progressing  Flowsheets (Taken 12/05/2021 1001)  Fluid and electrolyte balance are achieved/maintained:   Monitor intake and output every shift   Provide adequate hydration   Assess for confusion/personality changes   Monitor/assess lab values and report abnormal values   Monitor daily weight   Assess and reassess fluid and electrolyte status  Goal: Elimination patterns are normal or improving  Outcome: Progressing  Flowsheets (Taken 12/05/2021 1001)  Elimination patterns are normal or improving:   Assess for normal bowel sounds   Monitor for abdominal distension   Monitor for abdominal discomfort  Goal: Nutritional intake is adequate  Outcome: Progressing  Flowsheets (Taken 12/05/2021 1001)  Nutritional intake is adequate:    Monitor daily weights   Assist patient with meals/food selection   Allow adequate time for meals  Goal: Mobility/Activity is maintained at optimal level for patient  Outcome: Progressing  Goal: No bleeding  Outcome: Progressing  Flowsheets (Taken 12/05/2021 1001)  No bleeding:   Monitor and assess vitals and hemodynamic parameters   Monitor/assess lab values and report abnormal values   Assess for bruising/petechia     Problem: Altered GI Function  Goal: Fluid and electrolyte balance are achieved/maintained  Outcome: Progressing  Flowsheets (Taken 12/05/2021 1001)  Fluid and electrolyte balance are achieved/maintained:   Monitor intake and output every shift   Provide adequate hydration   Assess for confusion/personality changes   Monitor/assess lab values and report abnormal values   Monitor daily weight   Assess and reassess fluid and electrolyte status     Problem: Altered GI Function  Goal: Elimination patterns are normal or improving  Outcome: Progressing  Flowsheets (Taken 12/05/2021 1001)  Elimination patterns are normal or improving:   Assess for normal bowel sounds   Monitor for abdominal distension   Monitor for abdominal discomfort     Problem: Altered GI Function  Goal: Nutritional intake is adequate  Outcome: Progressing  Flowsheets (Taken 12/05/2021 1001)  Nutritional intake is adequate:   Monitor daily weights   Assist patient with meals/food selection   Allow adequate time for meals     Problem: Altered GI Function  Goal: No bleeding  Outcome: Progressing  Flowsheets (Taken 12/05/2021 1001)  No bleeding:   Monitor and assess vitals and hemodynamic parameters   Monitor/assess lab values and report abnormal values   Assess for bruising/petechia     Problem: Altered GI Function  Goal: Nutritional intake is adequate  Outcome: Progressing  Flowsheets (Taken 12/05/2021 1001)  Nutritional intake is adequate:   Monitor daily weights   Assist patient with meals/food selection   Allow adequate time for meals

## 2021-12-05 NOTE — ED to IP RN Note (Signed)
[facility] EMERGENCY DEPARTMENT  ED NURSING NOTE FOR THE RECEIVING INPATIENT NURSE   ED NURSE Willette Pa [phone]   ED CHARGE RN [phone]   ADMISSION INFORMATION   Aimee Watson is a 66 y.o. female admitted with an ED diagnosis of:    1. SBO (small bowel obstruction)         Isolation: None   Allergies: Penicillins   Holding Orders confirmed? Yes   Belongings Documented? Yes   Home medications sent to pharmacy confirmed? N/A   NURSING CARE   Patient Comes From:   Mental Status: Home Independent  alert and oriented   ADL: Independent with all ADLs   Ambulation: no difficulty   Pertinent Information  and Safety Concerns:     Broset Violence Risk Level: Low WBC elevated.      CT / NIH   CT Head ordered on this patient?  No   NIH/Dysphagia assessment done prior to admission? No   VITAL SIGNS (at the time of this note)      Vitals:    12/05/21 0000   BP: 123/58   Pulse: 71   Resp: 14   Temp: 98 F (36.7 C)   SpO2: 98%

## 2021-12-06 LAB — COMPREHENSIVE METABOLIC PANEL
ALT: 9 U/L (ref 0–55)
AST (SGOT): 17 U/L (ref 5–41)
Albumin/Globulin Ratio: 1.5 (ref 0.9–2.2)
Albumin: 3.4 g/dL — ABNORMAL LOW (ref 3.5–5.0)
Alkaline Phosphatase: 62 U/L (ref 37–117)
Anion Gap: 7 (ref 5.0–15.0)
BUN: 7 mg/dL (ref 7.0–21.0)
Bilirubin, Total: 0.9 mg/dL (ref 0.2–1.2)
CO2: 26 mEq/L (ref 17–29)
Calcium: 8.7 mg/dL (ref 8.5–10.5)
Chloride: 111 mEq/L (ref 99–111)
Creatinine: 0.7 mg/dL (ref 0.4–1.0)
Globulin: 2.3 g/dL (ref 2.0–3.6)
Glucose: 91 mg/dL (ref 70–100)
Potassium: 3.5 mEq/L (ref 3.5–5.3)
Protein, Total: 5.7 g/dL — ABNORMAL LOW (ref 6.0–8.3)
Sodium: 144 mEq/L (ref 135–145)
eGFR: >60 mL/min/{1.73_m2} (ref >=60)

## 2021-12-06 LAB — CBC AND DIFFERENTIAL
Absolute NRBC: 0 10*3/uL (ref 0.00–0.00)
Basophils Absolute Automated: 0.05 10*3/uL (ref 0.00–0.08)
Basophils Automated: 1.4 %
Eosinophils Absolute Automated: 0.14 10*3/uL (ref 0.00–0.44)
Eosinophils Automated: 3.9 %
Hematocrit: 32.1 % — ABNORMAL LOW (ref 34.7–43.7)
Hgb: 10.5 g/dL — ABNORMAL LOW (ref 11.4–14.8)
Immature Granulocytes Absolute: 0.01 10*3/uL (ref 0.00–0.07)
Immature Granulocytes: 0.3 %
Instrument Absolute Neutrophil Count: 1.88 10*3/uL (ref 1.10–6.33)
Lymphocytes Absolute Automated: 1.05 10*3/uL (ref 0.42–3.22)
Lymphocytes Automated: 29.6 %
MCH: 30.3 pg (ref 25.1–33.5)
MCHC: 32.7 g/dL (ref 31.5–35.8)
MCV: 92.8 fL (ref 78.0–96.0)
MPV: 11.2 fL (ref 8.9–12.5)
Monocytes Absolute Automated: 0.42 10*3/uL (ref 0.21–0.85)
Monocytes: 11.8 %
Neutrophils Absolute: 1.88 10*3/uL (ref 1.10–6.33)
Neutrophils: 53 %
Nucleated RBC: 0 /100 WBC (ref 0.0–0.0)
Platelets: 168 10*3/uL (ref 142–346)
RBC: 3.46 10*6/uL — ABNORMAL LOW (ref 3.90–5.10)
RDW: 13 % (ref 11–15)
WBC: 3.55 10*3/uL (ref 3.10–9.50)

## 2021-12-06 LAB — PHOSPHORUS: Phosphorus: 3.4 mg/dL (ref 2.3–4.7)

## 2021-12-06 LAB — MAGNESIUM: Magnesium: 2.2 mg/dL (ref 1.6–2.6)

## 2021-12-06 NOTE — Discharge Instr - AVS First Page (Addendum)
Reason for your Hospital Admission:  Small bowel obstruction, resolved with conservative measures.      Instructions for after your discharge:  Outpatient follow-up with PMD, low residue diet, advance as tolerated.

## 2021-12-06 NOTE — Discharge Summary (Signed)
[facility]      Patient: Aimee Watson  Admission Date: 12/04/2021   DOB: 10-26-1955  Discharge Date: 12/06/2021   MRN: [id number]  Discharge Attending:Anh Bigos Reita May, MD     Referring Physician: Pcp, None, MD  PCP: Pcp, None, MD       DISCHARGE SUMMARY     Discharge Information   Admission Diagnosis:   SBO (small bowel obstruction)    Discharge Diagnosis:   1.  Small bowel obstruction likely secondary to adhesion, managed conservatively, resolved, tolerated diet and had bowel movement prior to discharge.  2.  Hx of previous SBO, resolved with also spontaneously.  3.  Hx of abdominal surgery, s/p hemicolectomy with ileostomy takedown, history of rectal adenocarcinoma in the past.  4.  Asymptomatic cholelithiasis without evidence of cholecystitis.    Admission Condition: Guarded  Discharge Condition: Stable  Consultants:   General surgery  Functional Status: As tolerated  Discharged to: Home  Procedures: No invasive procedures  Surgeries: None    Imaging:     XR Abdomen AP    Result Date: 12/05/2021   No bowel obstruction. Contrast is present in the colon. Mild residual left lower quadrant ileus. Gallstone Kinnie Feil, MD 12/05/2021 12:38 PM    CT Abd/Pelvis with IV and PO Contrast    Result Date: 12/05/2021  1.Mildly dilated and fluid-filled loops of distal small bowel likely with transition point distally within or close to the ileocecal valve. There is diluted contrast proximally within the proximal colon. Findings are most suspicious for low-grade small bowel obstruction. No free intraperitoneal air or fluid. 2.Cholelithiasis without CT evidence of acute cholecystitis. Nelya Ebadirad 12/05/2021 1:56 AM    Echo Results       None          Discharge Medications:     Medication List        CONTINUE taking these medications      ascorbic acid 250 MG tablet  Commonly known as: VITAMIN C     L-Lysine HCl 500 MG Tabs     melatonin 3 mg tablet     Multi Vitamin/Minerals Tabs     omeprazole 20 MG  capsule  Commonly known as: PriLOSEC     psyllium 58.6 % packet  Commonly known as: METAMUCIL     Vitamin D3 2000 UNIT capsule     zinc gluconate 50 MG tablet            STOP taking these medications      diphenoxylate-atropine 2.5-0.025 MG per tablet  Commonly known as: LOMOTIL     loperamide 2 MG tablet  Commonly known as: IMODIUM A-D     Potassium 99 MG Tabs                  Hospital Course   Presentation History - 12/04/2021   Aimee Watson is a 66 y.o. female with a PMHx significant for including rectal adenocarcinoma (chemotherapy) s/p hemicolectomy with ileostomy reversal in February of this year who returned to the ED tonight complaining of RUQ cramping, increased diarrhea and nausea.  While she was in the ED, she  noted that she had a hard object on her abdominal wall just right of her umbilicus that has since disappeared.  Interestingly, the cramping pain is now across her lower abdomen in the suprapubic area and no longer in the RUQ.  She had no fever, chills, malaise or GU complaints. She was recently hospitalized here 9/10 - 9/12 for a  high-grade SBO that resolved with conservative management and saw her outpatient gastroenterologist (Dr. Alcide Evener) on 10/17 for increased loose stools.       Labs in the ED tonight a largely normal/within normal range     CT of the abdomen and pelvis suspicious for low-grade SBO.  Cholelithiasis without cholecystitis.     Hospital Course (1 Days):  Patient is 66 year old female admitted with above history for evaluation of abdominal pain and small bowel obstruction.  Abdominal imaging with CT abdomen/pelvis showed mildly dilated and fluid-filled loops of distal small bowel likely with transition point distally within or close to ileocecal valve.  Patient was managed with conservative approach with resolution of her SBO symptoms.  Patient tolerated diet and had bowel movements prior to discharge.  She was seen and evaluated by general surgery team as  well.  Diagnosis, lab findings, imaging studies and plan of care as well as recommended outpatient follow-ups all discussed with the patient, verbalized understanding.    RECOMMENDATIONS:    - Patient was informed of abnormal and incidental imaging findings during hospitalization, and advised to review this information with their  medical provider.             Best Practices   Was the patient admitted with either a CHF Exacerbation or Pneumonia? NO     Progress Note/Physical Exam at Discharge     Subjective: Patient reported feeling well, and is ready for discharge.    Vitals:    12/05/21 1916 12/05/21 2313 12/06/21 0402 12/06/21 0733   BP: 132/71 125/73 124/73 126/80   Pulse: 70 69 61 76   Resp: 16 16 16 15    Temp: 98.1 F (36.7 C) 97.9 F (36.6 C) 98.2 F (36.8 C) 97.9 F (36.6 C)   TempSrc: Oral Oral Oral Oral   SpO2: 100% 99% 98% 98%   Weight:       Height:           General: NAD  HEENT: sclera anicteric, OP: Clear, MMM  Cardiovascular: RRR, no m/r/g  Lungs: CTAB, no w/r/r  Abdomen: soft, +BS, NT/ND, no masses, no g/r  Extremities: Warm and well perfused  Skin: no rashes or lesions noted on exposed surfaces  Neuro: Answers questions appropriately, responds to commands       Diagnostics     Labs/Studies Pending at Discharge:    Last Labs   Recent Labs   Lab 12/06/21  0456 12/04/21  1909   WBC 3.55 9.69*   RBC 3.46* 4.27   Hgb 10.5* 12.6   Hematocrit 32.1* 37.8   MCV 92.8 88.5   Platelets 168 239       Recent Labs   Lab 12/06/21  0456 12/04/21  1909   Sodium 144 144   Potassium 3.5 3.8   Chloride 111 105   CO2 26 26   BUN 7.0 17.0   Creatinine 0.7 0.8   Glucose 91 114*   Calcium 8.7 10.1   Magnesium 2.2  --        Microbiology Results (last 15 days)       Procedure Component Value Units Date/Time    Stool Cryptosporidium / Giardia Antigen [[id number]] Collected: 12/04/21 1933    Order Status: Completed Specimen: Stool Updated: 12/05/21 0428    Narrative:      ORDER#: [id number]  ORDERED BY: MAKDISI, CHRIST  SOURCE: Stool stool                                  COLLECTED:  12/04/21 19:33  ANTIBIOTICS AT COLL.:                                RECEIVED :  12/05/21 01:57  Cryptosporidium/Giardia Antigen            FINAL       12/05/21 04:28  12/05/21   Negative for Cryptosporidium parvum antigen             Negative for Giardia lamblia antigen      Stool Enteric Viral Panel PCR [[id number]] Collected: 12/04/21 1933    Order Status: Canceled Specimen: Stool     Stool for Salm/Shig/Campy/Shiga PCR [[id number]] Collected: 12/04/21 1933    Order Status: Canceled Specimen: Stool     Clostridium difficile toxin B PCR [[id number]] Collected: 12/04/21 1933    Order Status: Canceled Specimen: Stool              Patient Instructions   Discharge Diet: Heart healthy  Discharge Activity: As tolerated  LABS/TESTING recommended after discharge    Follow Up Appointment:   Follow-up Information       Towanda Octave, MD Follow up.    Specialty: Surgery  Why: Call office to schedule appt to discuss diagnostic laparoscopy to aid in diagnosis and treatment of recurrent bowel obstructions  Contact information:  [address]  [place] [address]  [phone]               Pcp, None, MD .                              Time spent examining patient, discussing with patient/family regarding hospital course, chart review, reconciling medications and discharge planning: > 35 minutes.  This patient was examined by me on , the day of discharge.    Signed,  Lamona Curl, MD    9:44 AM 12/06/2021

## 2021-12-06 NOTE — Plan of Care (Addendum)
NURSING SHIFT NOTE     Patient: Aimee Watson  Day: 1      SHIFT EVENTS     Shift Narrative/Significant Events (PRN med administration, fall, RRT, etc.):     Pt c/o bilateral groin pain, tylenol given with good effect. No acute events overnight. No BM this shift.  Safety and fall precautions remain in place. Purposeful rounding completed.          ASSESSMENT     Changes in assessment from patient's baseline this shift:    Neuro: No  CV: No  Pulm: No  Peripheral Vascular: No  HEENT: No  GI: No  BM during shift: No, Last BM: Last BM Date: 12/05/21  GU: No   Integ: No  MS: No    Pain: Improved  Pain Interventions: Medications and Rest  Medications Utilized: tylenol    Mobility: PMP Activity: Step 6 - Walks in Room of Distance Walked (ft) (Step 6,7): 15 Feet           Lines     Patient Lines/Drains/Airways Status       Active Lines, Drains and Airways       Name Placement date Placement time Site Days    Peripheral IV 12/04/21 20 G Left Antecubital 12/04/21  2054  Antecubital  1                         VITAL SIGNS     Vitals:    12/05/21 2313   BP: 125/73   Pulse: 69   Resp: 16   Temp: 97.9 F (36.6 C)   SpO2: 99%       Temp  Min: 97.7 F (36.5 C)  Max: 98.6 F (37 C)  Pulse  Min: 69  Max: 86  Resp  Min: 16  Max: 21  BP  Min: 106/65  Max: 149/75  SpO2  Min: 99 %  Max: 100 %    No intake or output data in the 24 hours ending 12/06/21 0221           CARE PLAN        Problem: Safety  Goal: Patient will be free from injury during hospitalization  Outcome: Progressing  Goal: Patient will be free from infection during hospitalization  Outcome: Progressing     Problem: Pain  Goal: Pain at adequate level as identified by patient  Outcome: Progressing  Flowsheets (Taken 12/06/2021 0220)  Pain at adequate level as identified by patient:   Identify patient comfort function goal   Assess pain on admission, during daily assessment and/or before any "as needed" intervention(s)   Reassess pain within 30-60 minutes of any  procedure/intervention, per Pain Assessment, Intervention, Reassessment (AIR) Cycle   Evaluate if patient comfort function goal is met   Include patient/patient care companion in decisions related to pain management as needed   Evaluate patient's satisfaction with pain management progress     Problem: Fluid and Electrolyte Imbalance/ Endocrine  Goal: Fluid and electrolyte balance are achieved/maintained  Outcome: Progressing  Flowsheets (Taken 12/06/2021 0220)  Fluid and electrolyte balance are achieved/maintained:   Monitor intake and output every shift   Monitor/assess lab values and report abnormal values   Provide adequate hydration   Assess and reassess fluid and electrolyte status   Assess for confusion/personality changes  Goal: Adequate hydration  Outcome: Progressing     Problem: Altered GI Function  Goal: Fluid and electrolyte balance are achieved/maintained  Outcome:  Progressing  Flowsheets (Taken 12/06/2021 0220)  Fluid and electrolyte balance are achieved/maintained:   Monitor intake and output every shift   Monitor/assess lab values and report abnormal values   Provide adequate hydration   Assess and reassess fluid and electrolyte status   Assess for confusion/personality changes  Goal: Elimination patterns are normal or improving  Outcome: Progressing  Flowsheets (Taken 12/06/2021 0220)  Elimination patterns are normal or improving:   Monitor for abdominal discomfort   Monitor for abdominal distension   Assess for normal bowel sounds   Assess for flatus  Goal: Nutritional intake is adequate  Outcome: Progressing  Goal: Mobility/Activity is maintained at optimal level for patient  Outcome: Progressing  Goal: No bleeding  Outcome: Progressing

## 2021-12-06 NOTE — Plan of Care (Signed)
Ambulating in room; respirations even and nonlabored; iv patent; denies any pain  Problem: Safety  Goal: Patient will be free from injury during hospitalization  Outcome: Adequate for Discharge  Flowsheets (Taken 12/06/2021 1038)  Patient will be free from injury during hospitalization:   Assess patient's risk for falls and implement fall prevention plan of care per policy   Provide and maintain safe environment   Use appropriate transfer methods   Ensure appropriate safety devices are available at the bedside   Include patient/ family/ care giver in decisions related to safety   Hourly rounding   Assess for patients risk for elopement and implement Elopement Risk Plan per policy  Goal: Patient will be free from infection during hospitalization  Outcome: Adequate for Discharge  Flowsheets (Taken 12/06/2021 1038)  Free from Infection during hospitalization:   Assess and monitor for signs and symptoms of infection   Monitor lab/diagnostic results   Monitor all insertion sites (i.e. indwelling lines, tubes, urinary catheters, and drains)   Encourage patient and family to use good hand hygiene technique     Problem: Pain  Goal: Pain at adequate level as identified by patient  Outcome: Adequate for Discharge  Flowsheets (Taken 12/06/2021 1038)  Pain at adequate level as identified by patient:   Identify patient comfort function goal   Assess for risk of opioid induced respiratory depression, including snoring/sleep apnea. Alert healthcare team of risk factors identified.   Assess pain on admission, during daily assessment and/or before any "as needed" intervention(s)   Evaluate patient's satisfaction with pain management progress   Offer non-pharmacological pain management interventions     Problem: Side Effects from Pain Analgesia  Goal: Patient will experience minimal side effects of analgesic therapy  Outcome: Adequate for Discharge  Flowsheets (Taken 12/06/2021 1038)  Patient will experience minimal side effects of  analgesic therapy:   Assess for changes in cognitive function   Monitor/assess patient's respiratory status (RR depth, effort, breath sounds)   Prevent/manage side effects per LIP orders (i.e. nausea, vomiting, pruritus, constipation, urinary retention, etc.)   Evaluate for opioid-induced sedation with appropriate assessment tool (i.e. POSS)     Problem: Fluid and Electrolyte Imbalance/ Endocrine  Goal: Fluid and electrolyte balance are achieved/maintained  Outcome: Adequate for Discharge  Flowsheets (Taken 12/06/2021 1038)  Fluid and electrolyte balance are achieved/maintained:   Monitor intake and output every shift   Monitor/assess lab values and report abnormal values   Provide adequate hydration   Assess for confusion/personality changes   Monitor daily weight   Assess and reassess fluid and electrolyte status   Observe for seizure activity and initiate seizure precautions if indicated   Observe for cardiac arrhythmias  Goal: Adequate hydration  Outcome: Adequate for Discharge     Problem: Altered GI Function  Goal: Fluid and electrolyte balance are achieved/maintained  Outcome: Adequate for Discharge  Flowsheets (Taken 12/06/2021 1038)  Fluid and electrolyte balance are achieved/maintained:   Monitor intake and output every shift   Monitor/assess lab values and report abnormal values   Provide adequate hydration   Assess for confusion/personality changes   Monitor daily weight   Assess and reassess fluid and electrolyte status   Observe for seizure activity and initiate seizure precautions if indicated   Observe for cardiac arrhythmias  Goal: Elimination patterns are normal or improving  Outcome: Adequate for Discharge  Goal: Nutritional intake is adequate  Outcome: Adequate for Discharge  Goal: Mobility/Activity is maintained at optimal level for patient  Outcome: Adequate for  Discharge  Goal: No bleeding  Outcome: Adequate for Discharge

## 2021-12-06 NOTE — Progress Notes (Signed)
Discharge instructions given verbally and in writing to patient and son. They verbalize understanding of instructions; iv Upham'd; ambulate to exit without difficulty or complaint

## 2021-12-07 ENCOUNTER — Other Ambulatory Visit: Payer: Self-pay

## 2021-12-07 ENCOUNTER — Ambulatory Visit: Payer: Medicare Other

## 2021-12-07 ENCOUNTER — Ambulatory Visit
Admission: RE | Admit: 2021-12-07 | Discharge: 2021-12-07 | Disposition: A | Discharge status: Home / Self Care | Payer: Medicare Other | Source: Ambulatory Visit | Attending: Hematology | Admitting: Hematology

## 2021-12-07 ENCOUNTER — Telehealth: Payer: Self-pay

## 2021-12-07 DIAGNOSIS — C20 Malignant neoplasm of rectum: Secondary | ICD-10-CM | POA: Insufficient documentation

## 2021-12-07 MED ORDER — IOHEXOL 350 MG/ML IV SOLN
100.0000 mL | Freq: Once | INTRAVENOUS | Status: AC | PRN
Start: 2021-12-07 — End: 2021-12-07
  Administered 2021-12-07: 100 mL via INTRAVENOUS

## 2021-12-07 NOTE — Telephone Encounter (Signed)
Pt called to advise she was admitted to [facility] over weekend for nausea and vomiting with abdominal pain/cramping r/t possible SBO; discharged yesterday, 12/06/21.  Pt has past medical history of SBO and rectal cancer.  Pt reported she is feeling better today.      CT Abd/Pelvis was done.  Pt asked if repeat CT Abd/Pelvis is needed or just CT Chest.  Pt has Radiology appt at 1100 today and OV on 12/10/21.    Message sent to MD/RN.  Awaiting response.    >>Please advise.

## 2021-12-07 NOTE — Telephone Encounter (Signed)
Disregard opened in error °

## 2021-12-08 ENCOUNTER — Other Ambulatory Visit: Payer: Self-pay

## 2021-12-08 NOTE — Progress Notes (Signed)
D/C plan reviewed. Family to transport home    DCP: Home       12/08/21 0938   Discharge Disposition   Patient preference/choice provided? Yes   Physical Discharge Disposition Home   Mode of Transportation Car   Patient/Family/POA notified of transfer plan Patient informed only   Patient agreeable to discharge plan/expected d/c date? Yes   Bedside nurse notified of transport plan? Yes   CM Interventions   Follow up appointment scheduled? No   Reason no follow up scheduled? Family to schedule   Notified MD? Yes   Multidisciplinary rounds/family meeting before d/c? Yes   Medicare Checklist   Is this a Medicare patient? Yes     Natasha Bence, RN, BSN, CM  RN Care Manager I  [facility]  [phone]

## 2021-12-09 NOTE — Progress Notes (Signed)
Outpatient Oncology Nutrition Note  [name]    Aimee Watson   66 y.o. female     DOB: 02-19-1955  MRN: [id number]    RD received nutrition referral from  for nutrition counseling in setting of recurrent SBO. RD reached out to patient by phone to set up appointment, but patient unavailable at time of call. RD left voicemail as well as sent e-mail to patient requesting return call/e-mail at patient's convenience to set up appointment. RD will plan to schedule appointment once patient returns call/e-mail.    Claris Che MS, RD, CNSC  Oncology Dietitian Clinical Specialist   [facility]  [facility]  [email or web address]

## 2021-12-10 ENCOUNTER — Encounter: Payer: Self-pay | Admitting: Hematology

## 2021-12-10 ENCOUNTER — Ambulatory Visit: Payer: Medicare Other | Attending: Hematology | Admitting: Hematology

## 2021-12-10 VITALS — BP 147/72 | HR 71 | Temp 97.9°F | Resp 16 | Ht 63.0 in | Wt 125.8 lb

## 2021-12-10 DIAGNOSIS — D696 Thrombocytopenia, unspecified: Secondary | ICD-10-CM | POA: Insufficient documentation

## 2021-12-10 DIAGNOSIS — K56609 Unspecified intestinal obstruction, unspecified as to partial versus complete obstruction: Secondary | ICD-10-CM

## 2021-12-10 DIAGNOSIS — C20 Malignant neoplasm of rectum: Secondary | ICD-10-CM | POA: Insufficient documentation

## 2021-12-10 NOTE — Progress Notes (Signed)
[facility]  [address]  [place], [place] [address]  P: [phone]  F: [phone]        [facility]  [address] [place], [address]  [place], [place] [address]  P: [phone]  F: [phone]    Gastroenterologist: Dr. Alcide Evener  Surgeon:    CHIEF COMPLAINT  Rectal cancer    HISTORY OF PRESENT ILLNESS  Aimee Watson is a 66 y.o. woman with rectal cancer.     1) Rectal cancer:  -01/2020 colonoscopy for rectal bleeding revealed a mass 2cm from the anal verge. Biopsy confirmed adenocarcinoma. There was no e/o metastatic disease by CT. An MRI revealed a T2-T3 cancer with involvement of the internal sphincter and 2 positive mesorectal nodes.   -02/2020 neoadjuvant FOLFOX, followed by chemoRT 07/2020-08/2020.   -LAR with coloanal anastomosis and ypT1a/N0 disease (0/3 nodes), a near CR to therapy. She had MSI-L disease.  -03/2021 ostomy reversal   -postop course c/b bladder prolapse and urinary incontinence, frequent and loose bowel movements.    2) SBO:  -9/10-9/12 admitted to [facility] with high grade SBO. The transition point was near the ileostomy reversal site. The SBO resolved with conservative management.        INTERIM HISTORY  Aimee Watson returns to the office for follow up.     Since her last visit with me she was readmitted to [facility] with bowel obstruction 10/27-10/29. This resolved spontaneously but the transition point was also by the ileostomy reversal site. She reports feeling a hard lump in her abdominal wall on the way to the hospital, and is worried that a hernia may have precipitated the SBO.    She saw surgery during her hospitalization and was told that she might need a laparoscopy or LOA to address the underlying cause of the bowel obstruction. She is requesting a referral to another colorectal surgeon to discuss this.    She had CT chest which has not yet been read. Her CT A/P when she was in the hospital revealed no  e/o recurrent or metastatic disease. She has a normal CEA. She has not yet had CTDNA testing.    I reviewed notes from the hospital on the date of service.    Allergies   Allergen Reactions    Penicillins Rash     Current Outpatient Medications   Medication Sig Dispense Refill    ascorbic acid (VITAMIN C) 250 MG tablet Take 1 tablet (250 mg) by mouth      Cholecalciferol (Vitamin D3) 2000 UNIT capsule Take 1 capsule (2,000 Units) by mouth      L-Lysine HCl 500 MG Tab Take by mouth every evening      melatonin 3 mg tablet Take 1 tablet (3 mg) by mouth nightly      Multiple Vitamins-Minerals (Multi Vitamin/Minerals) Tab Take 1 tablet by mouth      omeprazole (PriLOSEC) 20 MG capsule Take 1 capsule (20 mg) by mouth      psyllium (METAMUCIL) 58.6 % packet Take 1 packet by mouth daily      zinc gluconate 50 MG tablet Take 1 tablet (50 mg) by mouth       No current facility-administered medications for this visit.     Past Medical History:   Diagnosis Date    Rectal adenocarcinoma     Small bowel obstruction      Past Surgical History:   Procedure Laterality Date    ROBOTIC, COLECTOMY, LAR  10/21/2020  [place] ILEOSTOMY  03/24/2021     No family history on file.  Social History     Socioeconomic History    Marital status: Widowed   Tobacco Use    Smoking status: Never    Smokeless tobacco: Never   Vaping Use    Vaping Use: Never used   Substance and Sexual Activity    Alcohol use: Yes     Comment: 1-2 a month of wine    Drug use: Never     Social Determinants of Health     Financial Resource Strain: Low Risk  (11/19/2021)    Overall Financial Resource Strain (CARDIA)     Difficulty of Paying Living Expenses: Not hard at all   Food Insecurity: No Food Insecurity (11/19/2021)    Hunger Vital Sign     Worried About Running Out of Food in the Last Year: Never true     Ran Out of Food in the Last Year: Never true   Transportation Needs: No Transportation Needs (11/19/2021)    PRAPARE - Product/process development scientist (Medical): No     Lack of Transportation (Non-Medical): No   Physical Activity: Insufficiently Active (11/19/2021)    Exercise Vital Sign     Days of Exercise per Week: 5 days     Minutes of Exercise per Session: 20 min   Stress: No Stress Concern Present (11/19/2021)    [employer] of Occupational Health - Occupational Stress Questionnaire     Feeling of Stress : Only a little   Social Connections: Moderately Isolated (11/19/2021)    Social Connection and Isolation Panel [NHANES]     Frequency of Communication with Friends and Family: More than three times a week     Frequency of Social Gatherings with Friends and Family: More than three times a week     Attends Religious Services: 1 to 4 times per year     Active Member of [employer] or Organizations: No     Attends Banker Meetings: Patient refused     Marital Status: Widowed   Intimate Partner Violence: Not At Risk (11/19/2021)    Humiliation, Afraid, Rape, and Kick questionnaire     Fear of Current or Ex-Partner: No     Emotionally Abused: No     Physically Abused: No     Sexually Abused: No   Housing Stability: Low Risk  (11/19/2021)    Housing Stability Vital Sign     Unable to Pay for Housing in the Last Year: No     Number of Places Lived in the Last Year: 2     Unstable Housing in the Last Year: No     Review of Systems    Objective:     Vitals:    12/10/21 0939   BP: 147/72   Pulse: 71   Resp: 16   Temp: 97.9 F (36.6 C)   SpO2: 99%     Physical Exam    I personally reviewed the data below.    LAB DATA:   Component      Latest Ref Rng 10/18/2021 10/19/2021 10/20/2021 11/12/2021 12/04/2021 12/06/2021   WBC      3.10 - 9.50 x10 3/uL 7.28  4.85  3.19  4.52  9.69 (H)  3.55    Hemoglobin      11.4 - 14.8 g/dL 16.1  09.6 (L)  04.5 (L)  12.3  12.6  10.5 (L)    Hematocrit  34.7 - 43.7 % 37.5  31.6 (L)  31.1 (L)  37.2  37.8  32.1 (L)    Platelet Count      142 - 346 x10 3/uL 224  189  152  195  239  168       Component      Latest Ref  Rng 12/04/2021 12/06/2021   Glucose      70 - 100 mg/dL 809 (H)  91    BUN      7.0 - 21.0 mg/dL 98.3  7.0    Creatinine      0.4 - 1.0 mg/dL 0.8  0.7    Sodium      135 - 145 mEq/L 144  144    Potassium      3.5 - 5.3 mEq/L 3.8  3.5    Chloride      99 - 111 mEq/L 105  111    CO2      17 - 29 mEq/L 26  26    Calcium      8.5 - 10.5 mg/dL 38.2  8.7    Protein Total      6.0 - 8.3 g/dL 7.6  5.7 (L)    Albumin      3.5 - 5.0 g/dL 4.5  3.4 (L)    AST      5 - 41 U/L 23  17    ALT      0 - 55 U/L 15  9    Alkaline Phosphatase      37 - 117 U/L 85  62    Bilirubin Total      0.2 - 1.2 mg/dL 0.8  0.9       Component      Latest Ref Rng 11/12/2021   CEA      0.0 - 5.0 ng/mL <1.7      RADIOLOGY DATA:   12/05/21 CT A/P  PRESSION:      1.Mildly dilated and fluid-filled loops of distal small bowel likely with  transition point distally within or close to the ileocecal valve. There is  diluted contrast proximally within the proximal colon. Findings are most  suspicious for low-grade small bowel obstruction. No free intraperitoneal  air or fluid.  2.Cholelithiasis without CT evidence of acute cholecystitis.       Assessment & Plan:   Aimee Watson is a 66 year old woman with a history of stage 3 (T3/N1) rectal cancer, s/p total neoadjuvant therapy and LAR. She is one year out from completion of therapy.      1) Rectal cancer:  -she had a near CR to treatment with ypT1a/N0 disease (0/3 nodes).   -discussed with her that at this point I would recommend she be monitored for recurrence.  -recommendations per NCCN for H/P every 3-6 months for 2y, then q65mo for a total of 5y. CEA at the same interval, CTDNA every 3 months for now. Will order CTDNA.  -CT C/A/P every 6-12 months for a total of 5y. Will plan CT C/A/P in 05/2022.  -colonoscopy one year after surgery, scheduled with GI Dr. Alcide Evener in 03/2022.      2) SBO:  -two recent SBOs ? due to adhesions from prior surgery. Given two recent bowel obstructions in quick succession,  concern for a focal adhesion that is serving as a transition point. May need to consider laparoscopy or LOA to prevent recurrent obstructions. She discussed this with a surgeon at [facility] but  is interested in following with another provider. Will refer to colorectal surgery Dr. Venetia MaxonStern.     3) Supportive care:  -[facility]  -Nutrition  -pelvic PT for incontinence and pelvic symptoms   -needs new [place] PMD        I spent a total of 40 minutes reviewing the chart, discussing the case with consultants, and discussing the plan of care with the patient on the date of service.     1. SBO (small bowel obstruction)        2. Rectal cancer  CBC and differential    RRL - CMP    CEA    Colorectal Surgery Referral: Estanislado SpireLawrence E. Venetia MaxonStern, MD [facility] [place]        Orders Placed This Encounter   Procedures    CBC and differential     Standing Status:   Future     Standing Expiration Date:   03/12/2022     Order Specific Question:   Release to patient     Answer:   Immediate    RRL - CMP     Standing Status:   Future     Standing Expiration Date:   03/12/2022     Order Specific Question:   Release to patient     Answer:   Immediate    CEA     Standing Status:   Future     Standing Expiration Date:   12/11/2022     Order Specific Question:   Release to patient     Answer:   Immediate    Colorectal Surgery Referral: Estanislado SpireLawrence E. Venetia MaxonStern, MD [facility] [place]     Standing Status:   Future     Standing Expiration Date:   12/11/2022     Referral Priority:   Routine     Referral Type:   Consultation     Referral Reason:   Specialty Services Required     Referred to Provider:   Temple PaciniStern, Lawrence E, MD     Requested Specialty:   Colon and Rectal Surgery     Number of Visits Requested:   1     There are no Patient Instructions on file for this visit.    Leonides SchanzYoomi Shawanda Sievert, MD

## 2021-12-11 NOTE — Progress Notes (Signed)
[employer] / [facility]    Initial Oncology Nutrition Assessment  In Person Encounter    Name: Aimee Watson  DOB: [birth year]  MRN: [id number]    Encounter Date: 12/11/2021  Referral Source: Patient Request    Medical Oncologist: Dr. Nedra Hai    Nutrition Summary   Aimee Watson is a 66 y.o. female with a diagnosis of rectal cancer diagnosed 01/2020 s/p neoadjuvant FOLFOX (02/2020) followed by chemo/RT (07/2020-08/2020), and LAR  with ostomy creation (10/2020), ileostomy takedown 03/2021 (all done in [place], [place]).  Patient currently on surveillance with Dr. Nedra Hai.  Course has been complicated by frequent loose BM and two recent hospitalizations at [facility] (10/2021 and 11/2021) for recurrent SBO.  Dr. Nedra Hai has provided a referral to colorectal surgeon Dr. Venetia Maxon for possible LOA.  Patient seen today for requested diet education related to management of bowel symptoms.    Nutritionally, patient reports stable appetite and PO intake.  She eats small, frequent meals and snacks and occasionally uses a protein supplement ([employer]).  She reports that she has maintained stable weight between 124-128 lbs since her ostomy reversal.  She has been using psyllium fiber to try to bulk stool; has days where she has 2-3 stools and days where she is, "going every hour".  She does not use anti-diarrheals as they have caused her to become constipated in the past.  Patient educated on low fiber diet in setting of frequent stooling and risk of recurrent obstruction.  Discussed Benefiber and Banatrol as alternatives to psyllium fiber.  Encouraged follow up with colorectal surgeon and GI.      Nutrition Interventions / Recommendations   Recommend consuming small/frequent meals and supplements (every 2-3 hours while awake) to maximize po intake and tolerance.    Recommend avoiding high insoluble fiber and lactose containing foods/beverages if experiencing diarrhea, while incorporating high soluble fiber foods  to help add formed to stool (food lists provided for reference).  Consider trying soluble fiber supplement such as Benefiber or Banatrol to help improve stool consistency.    Continue medical management of disease and treatment related symptoms as advised by treatment team.    Handouts Provided:  Low Fiber Diet      Nutrition Diagnosis     Altered GI function related to rectal cancer, total neoadjuvant therapy and LAR as evidenced by frequent loose stool reported.    Nutrition Monitoring and Evaluation   Goals:  Weight; with goal of preventing significant unintentional weight changes, initial  Oral intake; with goal of consuming >75% of estimated nutrition needs, initial   Symptom management; with goal of trying to mitigate discomfort or impact on ability to consume sufficient oral intake through diet modification as able, initial  Labs; with goal of electrolytes within normal limits and blood glucose <180 mg/dL, initial     Nutrition Risk: Low    Follow Up: Will remain available for follow up as needed. Provided RD contact information for nutrition questions/concerns. Consult RD as needed.    Nutrition Assessment   Cancer Diagnosis: rectal cancer     Cancer Treatment(s):  FOLFOX (02/2020), chemoRT (07/2020-08/2020), LAR (10/2020)    Nutrition Related Side Effects: frequent loose stool    PG-SGA Score: No data recorded    Food Allergies: NKFA    Physical Activity: active with routine activities    Oncology History   Rectal cancer   11/12/2021 Initial Diagnosis    Rectal cancer     12/10/2021 Cancer Staged  Staging form: Colon and Rectum, AJCC 8th Edition  - Clinical: Stage IIIB (cT3, cN1, cM0) - Signed by Leonides Schanz, MD on 12/10/2021           Past Medical History:  has a past medical history of Rectal adenocarcinoma and Small bowel obstruction.    Past Surgical History:  has a past surgical history that includes ROBOTIC, COLECTOMY, LAR (10/21/2020) and TAKEDOWN ILEOSTOMY (03/24/2021).    Current Medications:  Current  Outpatient Medications   Medication Instructions    ascorbic acid (VITAMIN C) 250 mg, Oral    L-Lysine HCl 500 MG Tab Oral, Every evening    melatonin 3 mg, Oral, At bedtime    Multiple Vitamins-Minerals (Multi Vitamin/Minerals) Tab 1 tablet, Oral    omeprazole (PRILOSEC) 20 mg, Oral    psyllium (METAMUCIL) 58.6 % packet 1 packet, Oral, Daily    Vitamin D3 2,000 Units, Oral    zinc gluconate 50 mg, Oral       Vitamins/Supplements: listed above    Labs:  Lab Results   Component Value Date/Time    NA 144 12/06/2021 04:56 AM    NA 143 11/12/2021 11:38 AM    CL 111 12/06/2021 04:56 AM    CL 104 11/12/2021 11:38 AM    K 3.5 12/06/2021 04:56 AM    K 4.0 11/12/2021 11:38 AM    CO2 26 12/06/2021 04:56 AM    CO2 28 11/12/2021 11:38 AM    BUN 7.0 12/06/2021 04:56 AM    BUN 16.0 11/12/2021 11:38 AM    CREAT 0.7 12/06/2021 04:56 AM    CREAT 0.5 11/12/2021 11:38 AM    GLU 91 12/06/2021 04:56 AM    GLU 96 11/12/2021 11:38 AM    CA 8.7 12/06/2021 04:56 AM    CA 9.9 11/12/2021 11:38 AM    MG 2.2 12/06/2021 04:56 AM    PHOS 3.4 12/06/2021 04:56 AM    EGFR >60.0 12/06/2021 04:56 AM    WBC 3.55 12/06/2021 04:56 AM    HCT 32.1 (L) 12/06/2021 04:56 AM    LIP 15 12/04/2021 07:09 PM     Lab Results   Component Value Date/Time    BILITOTAL 0.9 12/06/2021 04:56 AM    ALKPHOS 62 12/06/2021 04:56 AM    AST 17 12/06/2021 04:56 AM    AST 28 11/12/2021 11:38 AM    ALT 9 12/06/2021 04:56 AM    ALT <20 11/12/2021 11:38 AM    ALB 3.4 (L) 12/06/2021 04:56 AM       Anthropometrics:  Wt Readings from Last 20 Encounters:   12/10/21 57.1 kg (125 lb 12.8 oz)   12/05/21 56.8 kg (125 lb 3.5 oz)   11/24/21 57.7 kg (127 lb 3.2 oz)   11/12/21 57.6 kg (127 lb)   10/18/21 60.3 kg (133 lb)      Ht Readings from Last 1 Encounters:   12/10/21 1.6 m (5\' 3" )     BMI Readings from Last 1 Encounters:   12/10/21 22.28 kg/m     Usual Body Weight: 125 lbs     Weight History: stable x 9 months    Nutrition Focused Physical Exam:   Head: temple region: can see/feel  well-defined muscle (no wasting observed)  Upper body: deferred d/t outfit/positioning   Lower body: deferred d/t outfit/positioning 1    Estimated Nutrition Needs:  Calories: 1420-1704 kcal/day (25-30 kcal/kg)   Protein: 63-74 gm/day (1.1-1.3 gm/kg)   Fluid: 1600 mL/day (1 mL/kcal)   _______________________________________________________________________  Claris Che MS, RD, CNSC  Oncology Dietitian Clinical Specialist   [facility]  [facility]  [email or web address]

## 2021-12-29 ENCOUNTER — Encounter: Payer: Self-pay | Admitting: Hematology

## 2022-01-05 ENCOUNTER — Encounter: Payer: Self-pay | Admitting: Hematology

## 2022-01-20 ENCOUNTER — Encounter: Payer: Self-pay | Admitting: Hematology

## 2022-01-26 ENCOUNTER — Encounter: Payer: Self-pay | Admitting: Hematology

## 2022-02-09 ENCOUNTER — Encounter (INDEPENDENT_AMBULATORY_CARE_PROVIDER_SITE_OTHER): Payer: Self-pay

## 2022-02-09 ENCOUNTER — Other Ambulatory Visit (INDEPENDENT_AMBULATORY_CARE_PROVIDER_SITE_OTHER): Payer: Self-pay | Admitting: Gastroenterology

## 2022-02-09 DIAGNOSIS — Z85048 Personal history of other malignant neoplasm of rectum, rectosigmoid junction, and anus: Secondary | ICD-10-CM

## 2022-02-09 DIAGNOSIS — C20 Malignant neoplasm of rectum: Secondary | ICD-10-CM

## 2022-02-11 ENCOUNTER — Telehealth (INDEPENDENT_AMBULATORY_CARE_PROVIDER_SITE_OTHER): Payer: Self-pay | Admitting: Gastroenterology

## 2022-02-11 ENCOUNTER — Encounter (INDEPENDENT_AMBULATORY_CARE_PROVIDER_SITE_OTHER): Payer: Self-pay

## 2022-02-11 NOTE — Telephone Encounter (Signed)
rescheduled cln to 2/29 9:30am

## 2022-02-25 ENCOUNTER — Telehealth (INDEPENDENT_AMBULATORY_CARE_PROVIDER_SITE_OTHER): Payer: Self-pay | Admitting: Gastroenterology

## 2022-02-25 NOTE — Telephone Encounter (Signed)
called to reschedule cln to hosiptal setting due to bleeding disorder. lvm

## 2022-03-04 ENCOUNTER — Encounter: Admission: RE | Payer: Self-pay | Source: Ambulatory Visit

## 2022-03-04 ENCOUNTER — Ambulatory Visit: Admission: RE | Admit: 2022-03-04 | Payer: Medicare Other | Source: Ambulatory Visit | Admitting: Gastroenterology

## 2022-03-04 DIAGNOSIS — C20 Malignant neoplasm of rectum: Secondary | ICD-10-CM | POA: Insufficient documentation

## 2022-03-04 DIAGNOSIS — Z85048 Personal history of other malignant neoplasm of rectum, rectosigmoid junction, and anus: Secondary | ICD-10-CM

## 2022-03-04 SURGERY — DONT USE, USE 1094-COLONOSCOPY, DIAGNOSTIC (SCREENING)
Anesthesia: Anesthesia MAC / Sedation | Site: Anus

## 2022-03-09 ENCOUNTER — Ambulatory Visit: Payer: Medicare Other | Attending: Hematology

## 2022-03-09 DIAGNOSIS — C20 Malignant neoplasm of rectum: Secondary | ICD-10-CM

## 2022-03-09 LAB — CBC AND DIFFERENTIAL
Absolute NRBC: 0 10*3/uL (ref 0.00–0.00)
Basophils Absolute Automated: 0.1 10*3/uL — ABNORMAL HIGH (ref 0.00–0.08)
Basophils Automated: 2.1 %
Eosinophils Absolute Automated: 0.12 10*3/uL (ref 0.00–0.44)
Eosinophils Automated: 2.5 %
Hematocrit: 36.6 % (ref 34.7–43.7)
Hgb: 12.3 g/dL (ref 11.4–14.8)
Immature Granulocytes Absolute: 0.01 10*3/uL (ref 0.00–0.07)
Immature Granulocytes: 0.2 %
Instrument Absolute Neutrophil Count: 2.76 10*3/uL (ref 1.10–6.33)
Lymphocytes Absolute Automated: 1.21 10*3/uL (ref 0.42–3.22)
Lymphocytes Automated: 25.4 %
MCH: 30.7 pg (ref 25.1–33.5)
MCHC: 33.6 g/dL (ref 31.5–35.8)
MCV: 91.3 fL (ref 78.0–96.0)
MPV: 10.4 fL (ref 8.9–12.5)
Monocytes Absolute Automated: 0.56 10*3/uL (ref 0.21–0.85)
Monocytes: 11.8 %
Neutrophils Absolute: 2.76 10*3/uL (ref 1.10–6.33)
Neutrophils: 58 %
Nucleated RBC: 0 /100 WBC (ref 0.0–0.0)
Platelets: 226 10*3/uL (ref 142–346)
RBC: 4.01 10*6/uL (ref 3.90–5.10)
RDW: 13 % (ref 11–15)
WBC: 4.76 10*3/uL (ref 3.10–9.50)

## 2022-03-09 LAB — RRL - CMP
ALT Piccolo(R): <20 U/L (ref 10–47)
AST Piccolo(R): 25 U/L (ref 11–38)
Albumin Piccolo(R): 3.9 g/dL (ref 3.3–5.0)
Alkaline Phosphatase Piccolo(R): 75 U/L (ref 42–141)
BUN Piccolo(R): 14 mg/dL (ref 7.0–19.0)
Bilirubin Total Piccolo(R): 1 mg/dL (ref 0.2–1.6)
CO2 Piccolo(R): 31 mEq/L — ABNORMAL HIGH (ref 21–29)
Calcium Piccolo(R): 9.7 mg/dL (ref 8.5–10.5)
Chloride Piccolo(R): 103 mEq/L (ref 98–107)
Creatinine Piccolo(R): 0.8 mg/dL (ref 0.4–1.5)
Glucose Piccolo(R): 94 mg/dL (ref 70–100)
Potassium Piccolo(R): 3.6 mEq/L (ref 3.5–5.1)
Protein Piccolo(R): 7.2 g/dL (ref 6.4–8.1)
Sodium Piccolo(R): 141 mEq/L (ref 136–145)
eGFR: >60 mL/min/{1.73_m2} (ref >=60)

## 2022-03-09 LAB — CEA: CEA: <1.7 ng/mL (ref 0.0–5.0)

## 2022-03-12 ENCOUNTER — Ambulatory Visit: Payer: Medicare Other | Attending: Hematology | Admitting: Hematology

## 2022-03-12 ENCOUNTER — Encounter: Payer: Self-pay | Admitting: Hematology

## 2022-03-12 VITALS — BP 157/73 | HR 75 | Temp 98.2°F | Resp 17 | Wt 127.0 lb

## 2022-03-12 DIAGNOSIS — D696 Thrombocytopenia, unspecified: Secondary | ICD-10-CM

## 2022-03-12 DIAGNOSIS — C20 Malignant neoplasm of rectum: Secondary | ICD-10-CM | POA: Insufficient documentation

## 2022-03-12 DIAGNOSIS — D709 Neutropenia, unspecified: Secondary | ICD-10-CM | POA: Insufficient documentation

## 2022-03-12 NOTE — Progress Notes (Signed)
[facility]  [place], [place] [address]  P: [phone]  F: [place]  [place], [place]  [place], [place] [address]  P: [phone]  F: [phone]    Gastroenterologist: Dr. Herbert Moors  Surgeon: Dr. Berlin Hun    CHIEF COMPLAINT  Rectal cancer    HISTORY OF PRESENT ILLNESS  Aimee Watson is a 67 y.o. woman with rectal cancer.     1) Rectal cancer:  -01/2020 colonoscopy for rectal bleeding revealed a mass 2cm from the anal verge. Biopsy confirmed adenocarcinoma. There was no e/o metastatic disease by CT. An MRI revealed a T2-T3 cancer with involvement of the internal sphincter and 2 positive mesorectal nodes.   -02/2020 neoadjuvant FOLFOX, followed by chemoRT 07/2020-08/2020.   -LAR with coloanal anastomosis and ypT1a/N0 disease (0/3 nodes), a near CR to therapy. She had MSI-L disease.  -03/2021 ostomy reversal   -postop course c/b bladder prolapse and urinary incontinence, frequent and loose bowel movements.    2) SBO:  -9/10-9/12/23 and 10/27-10/29/23 admitted to [facility] with SBO. The transition point was near the ileostomy reversal site. The SBO resolved with conservative management.        INTERIM HISTORY  Ms. Winkles returns to the office for follow up.     She denies new complaints since her last visit. She has not had any more abdominal pain, nausea, vomiting, or bowel obstruction. She saw Dr. Vertell Limber and he recommended monitoring for recurrent SBO rather than proceeding to LOA at this time.     She reports ongoing symptoms of frequent and loose bowel movements mostly precipitated by eating larger meals. She is managing this on her own by eating much smaller and more frequent meals.     She had labs prior to this visit that revealed no anemia, no elevation in CEA, and normal findings otherwise.     I reviewed notes from the colorectal surgeon on the date of service.    Allergies   Allergen  Reactions    Penicillins Rash     Current Outpatient Medications   Medication Sig Dispense Refill    ascorbic acid (VITAMIN C) 250 MG tablet Take 1 tablet (250 mg) by mouth      Cholecalciferol (Vitamin D3) 2000 UNIT capsule Take 1 capsule (2,000 Units) by mouth      L-Lysine HCl 500 MG Tab Take by mouth every evening      melatonin 3 mg tablet Take 1 tablet (3 mg) by mouth nightly      Multiple Vitamins-Minerals (Multi Vitamin/Minerals) Tab Take 1 tablet by mouth      omeprazole (PriLOSEC) 20 MG capsule Take 1 capsule (20 mg) by mouth      Wheat Dextrin (BENEFIBER PO) Take by mouth      zinc gluconate 50 MG tablet Take 1 tablet (50 mg) by mouth      psyllium (METAMUCIL) 58.6 % packet Take 1 packet by mouth daily       No current facility-administered medications for this visit.     Past Medical History:   Diagnosis Date    Rectal adenocarcinoma     Small bowel obstruction      Past Surgical History:   Procedure Laterality Date    ROBOTIC, COLECTOMY, LAR  10/21/2020    TAKEDOWN ILEOSTOMY  03/24/2021     No family history on file.  Social History     Socioeconomic  History    Marital status: Widowed   Tobacco Use    Smoking status: Never    Smokeless tobacco: Never   Vaping Use    Vaping Use: Never used   Substance and Sexual Activity    Alcohol use: Yes     Comment: 1-2 a month of wine    Drug use: Never     Social Determinants of Health     Financial Resource Strain: Low Risk  (03/07/2022)    Overall Financial Resource Strain (CARDIA)     Difficulty of Paying Living Expenses: Not hard at all   Food Insecurity: No Food Insecurity (03/07/2022)    Hunger Vital Sign     Worried About Running Out of Food in the Last Year: Never true     Ran Out of Food in the Last Year: Never true   Transportation Needs: No Transportation Needs (03/07/2022)    PRAPARE - Therapist, art (Medical): No     Lack of Transportation (Non-Medical): No   Physical Activity: Insufficiently Active (03/07/2022)    Exercise Vital  Sign     Days of Exercise per Week: 5 days     Minutes of Exercise per Session: 10 min   Stress: No Stress Concern Present (03/07/2022)    [employer] of Occupational Health - Occupational Stress Questionnaire     Feeling of Stress : Only a little   Social Connections: Moderately Isolated (03/07/2022)    Social Connection and Isolation Panel [NHANES]     Frequency of Communication with Friends and Family: More than three times a week     Frequency of Social Gatherings with Friends and Family: More than three times a week     Attends Religious Services: More than 4 times per year     Active Member of [employer] or Organizations: No     Attends Banker Meetings: Patient declined     Marital Status: Widowed   Intimate Partner Violence: Not At Risk (03/07/2022)    Humiliation, Afraid, Rape, and Kick questionnaire     Fear of Current or Ex-Partner: No     Emotionally Abused: No     Physically Abused: No     Sexually Abused: No   Housing Stability: Low Risk  (03/07/2022)    Housing Stability Vital Sign     Unable to Pay for Housing in the Last Year: No     Number of Places Lived in the Last Year: 1     Unstable Housing in the Last Year: No     Review of Systems    Objective:     Vitals:    03/12/22 1018   BP: 157/73   Pulse:    Resp:    Temp:    SpO2:      Physical Exam  Constitutional:       General: She is not in acute distress.     Appearance: Normal appearance.   HENT:      Head: Normocephalic.   Eyes:      General: No scleral icterus.     Extraocular Movements: Extraocular movements intact.   Cardiovascular:      Rate and Rhythm: Normal rate and regular rhythm.      Pulses: Normal pulses.      Heart sounds: Normal heart sounds.   Pulmonary:      Effort: Pulmonary effort is normal. No respiratory distress.      Breath sounds: Normal breath sounds.  No wheezing or rales.   Abdominal:      General: Abdomen is flat. There is no distension.      Palpations: Abdomen is soft. There is no mass.      Tenderness:  There is no abdominal tenderness. There is no guarding.      Comments: Abdominal scars well healed   Musculoskeletal:         General: No swelling.      Cervical back: Neck supple.      Right lower leg: No edema.      Left lower leg: No edema.   Lymphadenopathy:      Cervical: No cervical adenopathy.   Skin:     General: Skin is warm and dry.      Coloration: Skin is not jaundiced.   Neurological:      Mental Status: She is alert and oriented to person, place, and time. Mental status is at baseline.         I personally reviewed the data below.    LAB DATA:   Component      Latest Ref Rng 03/09/2022   WBC      3.10 - 9.50 x10 3/uL 4.76    Hemoglobin      11.4 - 14.8 g/dL 12.3    Hematocrit      34.7 - 43.7 % 36.6    Platelet Count      142 - 346 x10 3/uL 226        Component      Latest Ref Rng 03/09/2022   Glucose Piccolo(R)      70 - 100 mg/dL 94    BUN Piccolo(R)      7.0 - 19.0 mg/dL 14.0    Creatinine Piccolo(R)      0.4 - 1.5 mg/dL 0.8    Sodium Piccolo(R)      136 - 145 mEq/L 141    Potassium Piccolo(R)      3.5 - 5.1 mEq/L 3.6    Chloride Piccolo(R)      98 - 107 mEq/L 103    CO2 Piccolo(R)      21 - 29 mEq/L 31 (H)    Calcium Piccolo(R)      8.5 - 10.5 mg/dL 9.7    Protein Piccolo(R)      6.4 - 8.1 g/dL 7.2    Albumin Piccolo(R)      3.3 - 5.0 g/dL 3.9    AST Piccolo(R)      11 - 38 U/L 25    ALT Piccolo(R)      10 - 47 U/L <20    Alkaline Phosphatase Piccolo(R)      42 - 141 U/L 75    Bilirubin Total Piccolo(R)      0.2 - 1.6 mg/dL 1.0       Component      Latest Ref Rng 03/09/2022   CEA      0.0 - 5.0 ng/mL <1.7            RADIOLOGY DATA:   12/05/21 CT A/P  PRESSION:      1.Mildly dilated and fluid-filled loops of distal small bowel likely with  transition point distally within or close to the ileocecal valve. There is  diluted contrast proximally within the proximal colon. Findings are most  suspicious for low-grade small bowel obstruction. No free intraperitoneal  air or fluid.  2.Cholelithiasis without CT  evidence of acute cholecystitis.       Assessment &  Plan:   Ms. Dayelin Balducci is a 67 year old woman with a history of stage 3 (T3/N1) rectal cancer, s/p total neoadjuvant therapy and LAR. She is 18 months out from completion of therapy.      1) Rectal cancer:  -she had a near CR to treatment with ypT1a/N0 disease (0/3 nodes). She completed treatment in 08/2020.  -she has no evidence of recurrence. Will continue to monitor.  -recommendations per NCCN for H/P every 3-6 months for 2y, then q37mo for a total of 5y. CEA at the same interval.  -CT C/A/P every 6-12 months for a total of 5y. Will plan CT C/A/P in 05/2022.  -colonoscopy one year after surgery, scheduled with GI Dr. Alcide Evener in 03/2022.      2) SBO:  -two recent SBOs ? due to adhesions from prior surgery. Plan is to consider LOA only if she has a recurrence at this point.      3) Supportive care:  -[facility]  -Nutrition  -pelvic PT for incontinence and pelvic symptoms   -refer to survivorship clinic        I spent a total of 30 minutes reviewing the chart, discussing the case with consultants, and discussing the plan of care with the patient on the date of service.     1. Rectal cancer  CT Chest Abdomen Pelvis W Contrast    Referral to [facility]    CBC and differential    RRL - CMP    CEA      2. Neutropenia, unspecified type        3. Thrombocytopenia          Orders Placed This Encounter   Procedures    CT Chest Abdomen Pelvis W Contrast     With IV and PO contrast     Standing Status:   Future     Standing Expiration Date:   03/13/2023     Scheduling Instructions:      To schedule your procedure please call your chosen [facility] Scheduling Number:      [facility] Scheduling [phone]      [facility] Scheduling [phone]     Order Specific Question:   Reason for exam:     Answer:   rectal cancer     Order Specific Question:   Release to patient     Answer:   Immediate    CBC and differential      Standing Status:   Future     Standing Expiration Date:   03/11/2023     Order Specific Question:   Release to patient     Answer:   Immediate    RRL - CMP     Standing Status:   Future     Standing Expiration Date:   03/11/2023     Order Specific Question:   Release to patient     Answer:   Immediate    CEA     Standing Status:   Future     Standing Expiration Date:   03/11/2023     Order Specific Question:   Release to patient     Answer:   Immediate    Referral to [facility]     Referral Priority:   Routine     Referral Type:   Consultation     Referral Reason:   Specialty Services Required     Requested Specialty:   Oncology  Number of Visits Requested:   1     There are no Patient Instructions on file for this visit.    Elon Alas, MD

## 2022-03-30 ENCOUNTER — Other Ambulatory Visit: Payer: Self-pay

## 2022-04-01 ENCOUNTER — Ambulatory Visit (INDEPENDENT_AMBULATORY_CARE_PROVIDER_SITE_OTHER): Payer: Medicare Other | Admitting: Physician Assistant

## 2022-04-01 ENCOUNTER — Encounter (INDEPENDENT_AMBULATORY_CARE_PROVIDER_SITE_OTHER): Payer: Self-pay

## 2022-04-01 VITALS — BP 165/70 | HR 83 | Temp 97.3°F | Resp 16 | Ht 63.0 in | Wt 129.0 lb

## 2022-04-01 DIAGNOSIS — N3090 Cystitis, unspecified without hematuria: Secondary | ICD-10-CM

## 2022-04-01 DIAGNOSIS — R300 Dysuria: Secondary | ICD-10-CM

## 2022-04-01 DIAGNOSIS — N399 Disorder of urinary system, unspecified: Secondary | ICD-10-CM

## 2022-04-01 LAB — MCKESSON POCT UA 120
Bilirubin UA: NEGATIVE
Glucose UA: NEGATIVE
Ketone UA: NEGATIVE
Nitrite UA: NEGATIVE
Protein UA: NEGATIVE
Specific Gravity UA: <=1.005
Urobilinogen UA: NEGATIVE
pH UA: 6

## 2022-04-01 MED ORDER — CEFDINIR 300 MG PO CAPS
300.0000 mg | ORAL_CAPSULE | Freq: Two times a day (BID) | ORAL | 0 refills | Status: AC
Start: 2022-04-01 — End: 2022-04-08

## 2022-04-01 NOTE — Patient Instructions (Signed)
Take the Cefdinir twice a day with food and a full glass of water.  Drink plenty of fluids.   If you experience fevers, nausea, back pain, or worsening symptoms, please go to the emergency room.

## 2022-04-01 NOTE — Progress Notes (Signed)
[facility]  CARE  PROGRESS NOTE     Patient: Aimee Watson   Date: 04/01/2022   MRN: [id number]       Aimee Watson is a 67 y.o. female      HISTORY     History obtained from: Patient    Chief Complaint   Patient presents with    Urinary Tract Infection Symptoms     couple days ago - painful urination and seen slight redness in the urine.        HPI Pt presents with painful urination for a few days, notes blood in urine today, so she came in for further evaluation.  Denies any other symptoms     Review of Systems ROS negative unless noted in hpi     History:    Pertinent Past Medical, Surgical, Family and Social History were reviewed.        Current Outpatient Medications:     ascorbic acid (VITAMIN C) 250 MG tablet, Take 1 tablet (250 mg) by mouth, Disp: , Rfl:     Cholecalciferol (Vitamin D3) 2000 UNIT capsule, Take 1 capsule (2,000 Units) by mouth, Disp: , Rfl:     L-Lysine HCl 500 MG Tab, Take by mouth every evening, Disp: , Rfl:     melatonin 3 mg tablet, Take 1 tablet (3 mg) by mouth nightly, Disp: , Rfl:     Multiple Vitamins-Minerals (Multi Vitamin/Minerals) Tab, Take 1 tablet by mouth, Disp: , Rfl:     omeprazole (PriLOSEC) 20 MG capsule, Take 1 capsule (20 mg) by mouth, Disp: , Rfl:     Wheat Dextrin (BENEFIBER PO), Take by mouth, Disp: , Rfl:     zinc gluconate 50 MG tablet, Take 1 tablet (50 mg) by mouth, Disp: , Rfl:     cefdinir (OMNICEF) 300 MG capsule, Take 1 capsule (300 mg) by mouth 2 (two) times daily for 7 days, Disp: 14 capsule, Rfl: 0    psyllium (METAMUCIL) 58.6 % packet, Take 1 packet by mouth daily, Disp: , Rfl:     Allergies   Allergen Reactions    Penicillins Rash       Medications and Allergies reviewed.    PHYSICAL EXAM     Vitals:    04/01/22 1220   BP: 165/70   Pulse: 83   Resp: 16   Temp: 97.3 F (36.3 C)   TempSrc: Tympanic   SpO2: 100%   Weight: 58.5 kg (129 lb)   Height: 1.6 m (5' 3"$ )       Physical Exam Vitals and nursing note reviewed.   Constitutional:        General: Not in acute distress.     Appearance: Normal appearance. Not ill-appearing or toxic-appearing.   HENT:      Head: Normocephalic and atraumatic.   Eyes:      Conjunctiva/sclera: Conjunctivae normal.   Neck:      Musculoskeletal: Normal range of motion.   Respiratory:      Normal effort. Able to speak in full sentences.  Neurological:      Mental Status: Alert and oriented.  Psychiatric:         Mood and Affect: Mood normal.         Behavior: Behavior normal.        UCC COURSE     LABS  The following POCT tests were ordered, reviewed and discussed with the patient/family.     Results       Procedure  Component Value Units Date/Time    UA [id number]  (Abnormal) Collected: 04/01/22 1241    Specimen: Clean Catch Updated: 04/01/22 1243     Color, UA Light Yellow     Clarity, UA Clear     Leukocytes UA 3+ (500 Leu/ul)     Nitrite UA Negative     Urobilinogen UA Negative (0.2 mg/dl)     Protein UA Negative     pH UA 6.0     Blood UA 3+ (200 Ery/ul)     Specific Gravity UA <=1.005     Ketone UA Negative     Bilirubin UA Negative     Glucose UA Negative            There were no x-rays reviewed with this patient during the visit.    No current facility-administered medications for this visit.       PROCEDURES     Procedures    MEDICAL DECISION MAKING     History, physical, labs/studies most consistent with UTI as the diagnosis.        Chart Review:  Prior PCP, Specialist and/or ED notes reviewed today: No  Prior labs/images/studies reviewed today: No    Differential Diagnosis: pyelonephritis, nephrolithiasis, urethritis, vaginitis, PID, cystitis    Treating for UTI due to leukocytes in urine. Urine culture sent.       ASSESSMENT     Encounter Diagnoses   Name Primary?    Urination disorder     Dysuria Yes    Cystitis                 PLAN      PLAN: see MDM             Orders Placed This Encounter   Procedures    Urine culture    UA     Requested Prescriptions     Signed Prescriptions Disp Refills    cefdinir  (OMNICEF) 300 MG capsule 14 capsule 0     Sig: Take 1 capsule (300 mg) by mouth 2 (two) times daily for 7 days       Discussed results and diagnosis with patient/family.  Reviewed warning signs for worsening condition, as well as, indications for follow-up with primary care physician and return to urgent care clinic.   Patient/family expressed understanding of instructions.     An After Visit Summary was provided to the patient.

## 2022-04-04 ENCOUNTER — Other Ambulatory Visit: Payer: Self-pay

## 2022-04-04 LAB — URINE CULTURE

## 2022-04-07 ENCOUNTER — Telehealth (INDEPENDENT_AMBULATORY_CARE_PROVIDER_SITE_OTHER): Payer: Self-pay | Admitting: Gastroenterology

## 2022-04-07 NOTE — Telephone Encounter (Signed)
Called pt and left a detailed vm for a return call to reschedule her procedure. Pt's procedure was canceled in January due to her needing to be in a hospital setting.

## 2022-04-12 NOTE — Progress Notes (Signed)
Survivorship Progress Note    Patient Name: Aimee Watson   Date of Birth: [birth year]     Date of Service: 04/12/2022      Diagnosis:  Encounter Diagnosis   Name Primary?    Rectal cancer Yes       Oncology History   Rectal cancer   11/12/2021 Initial Diagnosis    Rectal cancer     12/10/2021 Cancer Staged    Staging form: Colon and Rectum, AJCC 8th Edition  - Clinical: Stage IIIB (cT3, cN1, cM0) - Signed by Elon Alas, MD on 12/10/2021         We reviewed oncology history and treatment to date.    HPI  This is a lovely 67 year old woman  with a history of rectal cancer and a past medical history including:    Past Medical History:   Diagnosis Date    Rectal adenocarcinoma     Small bowel obstruction          Survivorship focused concerns  Aimee Watson wished to discuss the following survivorship focused issues with me today.  Over the last 14 days, how often have you been bothered by the following problems?    Feeling nervous, anxious, or on edge Not at all   Not able to stop or control worry Not at all   Little interest or pleasure in doing things Not at all   Feeling down, depressed, or hopeless Not at all   Fear of developing another cancer or a recurrence Several days   Please indicate if you developed any of the below concerns as a result of your cancer treatment by selecting Yes or No for each question.    Paying for food and/or housing    Paying for medication or medical care    Insurance Coverage or no health insurance    Please indicate if you developed any of the below concerns as a result of your cancer treatment by selecting Yes or No for each question.    Concerns about my children No   Concerns about my partner No   Issues with work or school No   Please indicate if you developed any of the below concerns as a result of your cancer treatment by selecting Yes or No for each question.    Spiritual, faith, or religious concerns No   Please indicate if you developed any of the below concerns as a result  of your cancer treatment by selecting Yes or No for each question.    Concerns about body weight No   Concerns about diet (food) and cancer risk/incidence Yes   Concerns about alternative/herbal supplements No   Please indicate if you developed any of the below concerns as a result of your cancer treatment by selecting Yes or No for each question.    Lack of understanding about my cancer diagnosis or stage No   Have questions about potential long term complications from my treatment Yes   Developed other illnesses as a result of my cancer treatment Yes   Issues with transportation to/from appointments No   Need help coordinating my care No   Need cancer screening No   Please indicate if you developed any of the below concerns as a result of your cancer treatment by selecting Yes or No for each question.    Ability to have children No   Appearance No   Breathing No   Constipation or Diarrhea Yes   Hot flashes and/or vaginal  dryness No   Nausea or Vomiting No   Difficulty with chewing or swallowing due to cancer therapy No   Pain Yes   Sexual itimacy or function No   Dry skin Yes   Sleep Yes   Decreased range of motion or loss of strength Yes   Lower energy level Yes   Swollen arm/legs No   Tingling in my hands/feet Yes   Trouble remembering, concentrating No   Please indicate which factors may be relevant to your lifestyle by checking Yes or No for each.    I use tanning beds No   I am often outside, in the sun Yes   I use tobacco No   I use prescription pain medication for reasons other than pain control No   I exercise regularly Yes   I drink alcohol No   I use recreational drugs No   What are your top three (3) most important survivorship concerns? 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 ECOG performance status is 0.  Vitals:    04/13/22 0956   BP: 168/76    Pulse: 84   Resp: 16   Temp: 97.8 F (36.6 C)   SpO2: 100%       Psych: Appropriate mood and affect with intact insight and judgement      TARGETED RECOMMENDATIONS AND INTERVENTIONS    Fear of recurrence    - I recommended: meditation, stress management, massage and yoga for reduction of emotional distress/ anxiety. I reviewed that classes are available for free through www.[facility].org  - Support groups and counseling.   I provided a referral today for our [facility] therapist and provided the patient information on our support groups.   - Reminded patient to channel energy into engaging in any of the lifestyle modifications that can help prevent recurrence or improve your health.  Actively shifting focus this way is a Network engineer and it is empowering.      Neuropathy  Suggested Interventions     - OTC remedies. A daily Vitamin B complex and Vitamin E supplement may help reduce the neuropathy symptoms associated with chemotherapy. Alpha-lipoic acid at 1200 to 1800 mg daily also has some benefit in neuropathy.   - Prescription Medications: Discuss with your doctor if prescription medications would be of benefit to you. SNRIs (Cymbalta) and tricyclic anti-depressants have had impact on chemotherapy induced neuropathy.  Nortriptyline may be the least sedating of the TCAs. The medications that have shown improvement in neuropathy include Gabapentin, Lyrica, and Cymbalta. Cymbalta is the only prescription medication that has been proven in double blind clinical trials to improve painful neuropathy from chemotherapy.   - Acupuncture:  This [place] medicine approach is helpful to approximately 50% of patients who try it for neuropathy.  If interested, practitioners generally recommend trying it at least twice a week for two weeks.  If you get no benefit, you are likely going to be a non-responder.  If you do get benefit, they generally continue on a weekly basis as needed.      Lower Anterior Resection  Syndrome.    She followed the guidance around fiber to add bulk to stoools following resection.  Prior to that, she had >10 small stools daily.  Unfortunately, she developed scar tissue around the anastamosis site and with the bulk  of her stools, she developed a small bowel obstruction.  She feels like she is back to square one after reversing instructions for a low fiber diet.  Some days, she can't leave the house due to frequent stooling and incontinence.    I referred her for pelvic floor physical therapy and specifically to the groups our GI oncology team recommended.    We also had a discussion about sacral nerve stimulation if pelvic floor PT in addition to fiber changes in her diet are not sufficient to give her the bowel control she needs to live the life she had hoped for.      Aimee Watson also thinks stress adds to an irritable bowel.  She would like to connect with our behavioral health therapist for fear of recurrence and new normal identity.       Health Maintenance/Screening   Yearly Physical Exam: You should continue to follow up with your primary care provider at least once a year for a complete physical exam, including cholesterol, diabetes and thyroid function monitoring.  Mammogram: Annually   DEXA scan: Every 2 years or soon at the discretion of your specialist  Colon cancer screening:  She is due now for colonoscopy and is working on getting that scheduled.        Follow Up/Referrals     Our nurse navigator, Harmon Pier, will follow up with this patient in a few weeks. I will see the patient back in 3-4 months.  I have referred this patient to [facility] for counseling.  I have referred this pelvic floor PT.      Bo Merino, PA    My total visit time for this patient encounter was 60 min. This includes time spent preparing to see the patient, counseling patient, care coordination, and documenting clinical information in the record.    Shanina Kepple L. Justin Mend DMSc, PA-C  [place] [facility]

## 2022-04-13 ENCOUNTER — Encounter: Payer: Self-pay | Admitting: Physician Assistant

## 2022-04-13 ENCOUNTER — Ambulatory Visit: Payer: Medicare Other | Attending: Physician Assistant | Admitting: Physician Assistant

## 2022-04-13 VITALS — BP 168/76 | HR 84 | Temp 97.8°F | Resp 16 | Ht 63.0 in | Wt 131.6 lb

## 2022-04-13 DIAGNOSIS — C20 Malignant neoplasm of rectum: Secondary | ICD-10-CM | POA: Insufficient documentation

## 2022-04-14 ENCOUNTER — Telehealth (INDEPENDENT_AMBULATORY_CARE_PROVIDER_SITE_OTHER): Payer: Self-pay

## 2022-04-14 NOTE — Telephone Encounter (Signed)
[facility]  Oncology Nurse Navigator   Voicemail Encounter  April 14, 2022       Attempted to call patient after receiving a referral from survivorship PA, Olivia Mackie .  Voicemail left with name, role and callback number. Remain available for contact.    Toula Moos, BSN, RN  Oncology Nurse Navigator  [facility]  [phone]

## 2022-04-19 ENCOUNTER — Encounter (INDEPENDENT_AMBULATORY_CARE_PROVIDER_SITE_OTHER): Payer: Self-pay | Admitting: Gastroenterology

## 2022-04-21 ENCOUNTER — Telehealth (INDEPENDENT_AMBULATORY_CARE_PROVIDER_SITE_OTHER): Payer: Self-pay

## 2022-04-21 ENCOUNTER — Ambulatory Visit: Payer: Self-pay | Attending: Physician Assistant

## 2022-04-21 NOTE — Progress Notes (Signed)
**  CONFIDENTIAL: MENTAL HEALTH RECORD**     [facility] Clinical Therapist Supportive Consult Note     Patient Name: Aimee Watson   Date of Birth: Feb 07, 1956     Date of Service: 04/21/2022   Session Start/Stop Time: 9:30 - 10:30 AM     Present at Session: Patient     Presenting Concern(s) Adjustment     Session Narrative: Patient (hx of rectal cancer) reported stress related to life changes and transitions, including survivorship. Behavioral Health Therapist (BHT) provided support by providing active, empathetic listening and providing validation. Patient was appreciative of support from [place].      Follow-up Plan and Recommendations:  - Patient amenable to establishing care with outpatient therapist to navigate life transitions. BHT will provide resource via [place].   - BHT available to support prn    Cecilio Asper, [facility]  [phone]

## 2022-04-21 NOTE — Telephone Encounter (Signed)
[facility]                                                    Oncology Nurse Navigator           Phone Call Encounter         Oncology nurse navigator [facility]) introduced self and the services of [facility]) such as fitness classes, support groups, educational classes, stress reduction classes, and counseling. Discussed concerns around sleep, patient reports that on weeks where she has multiple appointments she tends to have more difficulty with falling asleep. Reviewed tips to manage her anticipation around appointments. She will be setting up PFPT and acupuncture to manage long term side effects and has set up a PCP appointment. Patient verbalized understanding of information. Encouraged her to reach out at any time with further questions or concerns. Provided ONN contact information.     Aimee Watson, BSN, RN   Oncology Nurse Navigator  [facility]   [phone]

## 2022-04-21 NOTE — Telephone Encounter (Signed)
In error

## 2022-04-25 ENCOUNTER — Encounter (INDEPENDENT_AMBULATORY_CARE_PROVIDER_SITE_OTHER): Payer: Self-pay

## 2022-05-06 ENCOUNTER — Encounter (INDEPENDENT_AMBULATORY_CARE_PROVIDER_SITE_OTHER): Payer: Self-pay | Admitting: Internal Medicine

## 2022-05-06 ENCOUNTER — Ambulatory Visit (INDEPENDENT_AMBULATORY_CARE_PROVIDER_SITE_OTHER): Payer: Medicare Other | Admitting: Internal Medicine

## 2022-05-06 ENCOUNTER — Ambulatory Visit: Payer: Medicare Other | Attending: Internal Medicine

## 2022-05-06 ENCOUNTER — Other Ambulatory Visit (INDEPENDENT_AMBULATORY_CARE_PROVIDER_SITE_OTHER): Payer: Self-pay | Admitting: Internal Medicine

## 2022-05-06 ENCOUNTER — Ambulatory Visit: Payer: Medicare Other

## 2022-05-06 VITALS — BP 138/78 | HR 75 | Temp 97.9°F | Resp 18 | Ht 64.0 in | Wt 127.0 lb

## 2022-05-06 DIAGNOSIS — R7989 Other specified abnormal findings of blood chemistry: Secondary | ICD-10-CM

## 2022-05-06 DIAGNOSIS — G629 Polyneuropathy, unspecified: Secondary | ICD-10-CM

## 2022-05-06 DIAGNOSIS — M1990 Unspecified osteoarthritis, unspecified site: Secondary | ICD-10-CM | POA: Insufficient documentation

## 2022-05-06 DIAGNOSIS — Z7689 Persons encountering health services in other specified circumstances: Secondary | ICD-10-CM

## 2022-05-06 DIAGNOSIS — R419 Unspecified symptoms and signs involving cognitive functions and awareness: Secondary | ICD-10-CM

## 2022-05-06 LAB — CBC AND DIFFERENTIAL
Absolute NRBC: 0 10*3/uL (ref 0.00–0.00)
Basophils Absolute Automated: 0.1 10*3/uL — ABNORMAL HIGH (ref 0.00–0.08)
Basophils Automated: 1.9 %
Eosinophils Absolute Automated: 0.1 10*3/uL (ref 0.00–0.44)
Eosinophils Automated: 1.9 %
Hematocrit: 38.9 % (ref 34.7–43.7)
Hgb: 12.8 g/dL (ref 11.4–14.8)
Immature Granulocytes Absolute: 0.01 10*3/uL (ref 0.00–0.07)
Immature Granulocytes: 0.2 %
Instrument Absolute Neutrophil Count: 3.41 10*3/uL (ref 1.10–6.33)
Lymphocytes Absolute Automated: 1.14 10*3/uL (ref 0.42–3.22)
Lymphocytes Automated: 21.6 %
MCH: 29.8 pg (ref 25.1–33.5)
MCHC: 32.9 g/dL (ref 31.5–35.8)
MCV: 90.5 fL (ref 78.0–96.0)
MPV: 11.3 fL (ref 8.9–12.5)
Monocytes Absolute Automated: 0.52 10*3/uL (ref 0.21–0.85)
Monocytes: 9.8 %
Neutrophils Absolute: 3.41 10*3/uL (ref 1.10–6.33)
Neutrophils: 64.6 %
Nucleated RBC: 0 /100 WBC (ref 0.0–0.0)
Platelets: 225 10*3/uL (ref 142–346)
RBC: 4.3 10*6/uL (ref 3.90–5.10)
RDW: 13 % (ref 11–15)
WBC: 5.28 10*3/uL (ref 3.10–9.50)

## 2022-05-06 LAB — COMPREHENSIVE METABOLIC PANEL
ALT: 13 U/L (ref 0–55)
AST (SGOT): 22 U/L (ref 5–41)
Albumin/Globulin Ratio: 1.4 (ref 0.9–2.2)
Albumin: 4.2 g/dL (ref 3.5–5.0)
Alkaline Phosphatase: 87 U/L (ref 37–117)
Anion Gap: 11 (ref 5.0–15.0)
BUN: 13 mg/dL (ref 7.0–21.0)
Bilirubin, Total: 0.9 mg/dL (ref 0.2–1.2)
CO2: 26 mEq/L (ref 17–29)
Calcium: 9.5 mg/dL (ref 8.5–10.5)
Chloride: 107 mEq/L (ref 99–111)
Creatinine: 0.9 mg/dL (ref 0.4–1.0)
Globulin: 3.1 g/dL (ref 2.0–3.6)
Glucose: 87 mg/dL (ref 70–100)
Potassium: 4 mEq/L (ref 3.5–5.3)
Protein, Total: 7.3 g/dL (ref 6.0–8.3)
Sodium: 144 mEq/L (ref 135–145)
eGFR: >60 mL/min/{1.73_m2} (ref >=60)

## 2022-05-06 LAB — C-REACTIVE PROTEIN: C-Reactive Protein: 0.1 mg/dL (ref 0.0–1.1)

## 2022-05-06 LAB — SEDIMENTATION RATE: Sed Rate: 33 mm/Hr — ABNORMAL HIGH (ref 0–20)

## 2022-05-06 LAB — VITAMIN B12: Vitamin B-12: 339 pg/mL (ref 211–911)

## 2022-05-06 LAB — RHEUMATOID FACTOR: Rheumatoid Factor: <13 IU/mL (ref 0.0–30.0)

## 2022-05-06 LAB — URIC ACID: Uric acid: 5.1 mg/dL (ref 2.6–7.1)

## 2022-05-06 LAB — HEMOLYSIS INDEX: Hemolysis Index: 4 Index (ref 0–24)

## 2022-05-06 NOTE — Progress Notes (Signed)
Have you seen any specialists since your last visit with us?  Yes

## 2022-05-06 NOTE — Progress Notes (Signed)
Subjective:      Date: 05/06/2022 10:30 AM   Patient ID: Aimee Watson is a 67 y.o. female.    Chief Complaint:  Chief Complaint   Patient presents with    Establish Care       HPI:  HPI    67 year old Female with with history of rectal cancer, GERD with hiatal hernia, allergies comes in today to establish care.       Moved from [place].      BP: 138/78  Helps take care of 31 year old granddaughter   BP at Oncologist office. 168/76, 157/73, 147/72  At home 120s- low 130s/70s-80s. Last check was 2 weeks ago. 133/70s.  Denies any headache, shortness of breath, orthopnea, PND, blurry vision, chest pain, palpitation, hematuria       Allergies:  -Zyrtec over the counter.     GERD with hiatal hernia.    -Omeprazole 20 mg daily.     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   -Pain in multiple MCPs both sides, also in knuckles. Describes as  burning pain. -Finger tips numbness and tingling. Also numbness and tingling as well. Stiff hands. Morning and evening stiffness lasting more than an hour. Using exercise ball and using Voltaren gel. Unable to close the fingers due to stiffness.   -Started after the chemo.         Rectal Cancer :  -01/18/20 had surgery.   -Ct scans every 6 month.  -Colonoscopy scheduled.   -Followed by Oncologist every 3 months.   -Followed by GI   -Also seeing Survivorship.     Reports that she has anxiety due to her move. She is living with her Son in her own apartment at [employer].    Sometimes has bad days due to unusual BM. Stomach gurgling, palpitation.     Problem List:  Patient Active Problem List   Diagnosis    SBO (small bowel obstruction)    Rectal cancer    Thrombocytopenia    History of rectal cancer    Neutropenia, unspecified type       Current Medications:  Outpatient Medications Marked as Taking for the 05/06/22 encounter (Office Visit) with Benson Norway, MD   Medication Sig Dispense Refill    ascorbic acid (VITAMIN C) 250 MG tablet Take 1 tablet (250 mg) by mouth       Cholecalciferol (Vitamin D3) 2000 UNIT capsule Take 1 capsule (2,000 Units) by mouth      L-Lysine HCl 500 MG Tab Take by mouth every evening      melatonin 3 mg tablet Take 1 tablet (3 mg) by mouth nightly      Multiple Vitamins-Minerals (Multi Vitamin/Minerals) Tab Take 1 tablet by mouth      omeprazole (PriLOSEC) 20 MG capsule Take 1 capsule (20 mg) by mouth      Wheat Dextrin (BENEFIBER PO) Take by mouth      zinc gluconate 50 MG tablet Take 1 tablet (50 mg) by mouth         Allergies:  Allergies   Allergen Reactions    Penicillins Rash       Past Medical History:  Past Medical History:   Diagnosis Date    Rectal adenocarcinoma     Small bowel obstruction        Past Surgical History:  Past Surgical History:   Procedure Laterality Date    ROBOTIC, COLECTOMY, LAR  10/21/2020    TAKEDOWN ILEOSTOMY  03/24/2021  Family History:  Family History   Problem Relation Age of Onset    Rheum arthritis Mother        Social History:  Social History     Tobacco Use    Smoking status: Never    Smokeless tobacco: Never   Vaping Use    Vaping status: Never Used   Substance Use Topics    Alcohol use: Yes     Comment: 1-2 a month of wine    Drug use: Never          The following sections were reviewed this encounter by the provider:          ROS:  Review of Systems   General/Constitutional:   Denies Chills. Denies Fatigue. Denies Fever. Denies any weight changes  Ophthalmologic:   Denies vision problems  ENT:   Denies Sore throat.   Respiratory:   Denies Cough. Denies Shortness of breath.  Cardiovascular:   Denies Chest pain. Denies Palpitations. Denies lower extremity swelling.  Gastrointestinal:   Denies Abdominal pain. Denies Constipation.Denies Nausea. Denies  Vomiting.   Skin:   Denies Rash.   Neurologic:   Denies Dizziness. Denies Headache.     Objective:   Vitals:  BP 138/78 (BP Site: Right arm, Patient Position: Sitting, Cuff Size: Medium)   Pulse 75   Temp 97.9 F (36.6 C) (Temporal)   Resp 18   Ht 1.626 m (5'  4")   Wt 57.6 kg (127 lb)   SpO2 99%   BMI 21.80 kg/m       Physical Exam:  General Examination:   Physical Exam   GENERAL APPEARANCE: alert, in no acute distress, well developed, well nourished  oriented to time, place, and person.   ORAL CAVITY: normal oropharynx, normal lips, mucosa moist.   NECK/THYROID: neck supple, no neck mass palpated, no thyromegaly.  LYMPH: no cervical/supraclavicular adenopathy   HEART: S1, S2 normal, no murmurs, rubs, gallops, regular rate and rhythm.   LUNGS: normal effort / no distress, normal breath sounds, clear to auscultation  bilaterally, no wheezes, rales, rhonchi.   EXTREMITIES: No LE edema bilaterally.   PERIPHERAL PULSES: 2+ dorsalis pedis  PSYCH: alert and oriented to time,place and person.   SKIN: moist, no focal rash    Assessment:       1. Encounter to establish care    2. Arthritis  - Sedimentation rate (ESR)  - C Reactive Protein  - Rheumatoid factor  - Uric acid  - Cyclic citrul peptide antibody, IgG  - ANA IFA w reflex to Titer/Pattern/Antibody  - CBC and differential  - Comprehensive metabolic panel  - XR Hand Bilateral 2 Views    3. Neuropathy  - Vitamin B12    4. Unspecified symptoms and signs involving cognitive functions and awareness  - Vitamin B12    5. Other specified abnormal findings of blood chemistry  - CBC and differential        Plan:     As above       Follow-up:   Return in about 2 months (around 07/06/2022) for [facility] Visit.       Benson Norway, MD

## 2022-05-07 ENCOUNTER — Encounter (INDEPENDENT_AMBULATORY_CARE_PROVIDER_SITE_OTHER): Payer: Self-pay | Admitting: Internal Medicine

## 2022-05-07 LAB — ANA IFA W/REFLEX TO TITER/PATTERN/AB
ANA Screen: POSITIVE — AB
ANA Titer: 1:320 {titer} — AB

## 2022-05-07 LAB — PATHOLOGY REVIEW-ANA

## 2022-05-08 LAB — REFLEX EXTRACTABLE NUCLEAR ANTIGEN ANTIBODY
Centromere Antibody Interpretation: NEGATIVE
Centromere Antibody: 10 U/mL (ref 0–19)
Chromatin Antibody Interp: POSITIVE — AB
Chromatin Antibody: 22 U/mL — ABNORMAL HIGH (ref 0–19)
Double Stranded DNA(dsDNA)Antibody Interpretation: NEGATIVE
Double Stranded DNA(dsDNA)Antibody: 98 U/mL (ref 0–200)
Histone Antibody Interpretation: POSITIVE — AB
Histone Antibody: 1.7 U/mL — ABNORMAL HIGH (ref 0.0–0.9)
JO-1 Antibody Interpretation: NEGATIVE
Jo-1 Antibody: 3 U/mL (ref 0–19)
RNA Polymerase III Antibody Interpretation: NEGATIVE
RNA Polymerase III Antibody: 9 U/mL (ref 0–19)
Ribonucleoprotein (RNP) Antibody Interpretation: NEGATIVE
Ribonucleoprotein (RNP) Antibody: 3 U/mL (ref 0–19)
Ribosomal P Antibody Interpretation: NEGATIVE
Ribosomal P Antibody: 3 U/mL (ref 0–19)
Scleroderma (Scl-70) Antibody Interpretation: POSITIVE — AB
Scleroderma (Scl-70) Antibody: 80 U/mL — ABNORMAL HIGH (ref 0–19)
Sjogrens SSA (RO) Antibody Interpretation: NEGATIVE
Sjogrens SSA (Ro) Antibody: 3 U/mL (ref 0–19)
Sjogrens SSB (La) Antibody Interpretation: NEGATIVE
Sjogrens SSB (La) Antibody: 3 U/mL (ref 0–19)
Smith (Sm) Antibody Interpretation: NEGATIVE
Smith (Sm) Antibody: 5 U/mL (ref 0–19)

## 2022-05-11 ENCOUNTER — Encounter (INDEPENDENT_AMBULATORY_CARE_PROVIDER_SITE_OTHER): Payer: Self-pay | Admitting: Internal Medicine

## 2022-05-11 ENCOUNTER — Telehealth (INDEPENDENT_AMBULATORY_CARE_PROVIDER_SITE_OTHER): Payer: Medicare Other | Admitting: Internal Medicine

## 2022-05-11 DIAGNOSIS — M85842 Other specified disorders of bone density and structure, left hand: Secondary | ICD-10-CM

## 2022-05-11 DIAGNOSIS — R768 Other specified abnormal immunological findings in serum: Secondary | ICD-10-CM

## 2022-05-11 DIAGNOSIS — Z780 Asymptomatic menopausal state: Secondary | ICD-10-CM

## 2022-05-11 DIAGNOSIS — M130 Polyarthritis, unspecified: Secondary | ICD-10-CM

## 2022-05-11 DIAGNOSIS — M85841 Other specified disorders of bone density and structure, right hand: Secondary | ICD-10-CM

## 2022-05-11 NOTE — Progress Notes (Signed)
Subjective:      Date: 05/11/2022 10:55 AM   Patient ID: Aimee Watson is a 67 y.o. female.    Verbal consent has been obtained from the patient to conduct this video visit encounter to minimize exposure to COVID-19. Patient was positively identified and was verified to be located in the state of [place] at the time of the visit     Chief Complaint:  Chief Complaint   Patient presents with    Follow-up     Discuss lab results       HPI:  HPI   67 year old Female with with history of rectal cancer, GERD with hiatal hernia, allergies comes in today for follow up to discuss results.          BP: 132-138/72-78  Helps take care of 22 year old granddaughter   Denies any headache, shortness of breath, orthopnea, PND, blurry vision, chest pain, palpitation, hematuria         Allergies:  -Zyrtec over the counter.      GERD with hiatal hernia.    -Omeprazole 20 mg daily.      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   -Pain in multiple MCPs both sides, also in knuckles. Describes as  burning pain. -Finger tips numbness and tingling. Also numbness and tingling as well. Stiff hands. Morning and evening stiffness lasting more than an hour. Using exercise ball and using Voltaren gel. Unable to close the fingers due to stiffness.   -Started after the chemo.   -Hand X-rays with minimal DJD and borderline osteopenia   -ANA +ve 1:320,  ESR elevated.            Rectal Cancer :  -01/18/20 had surgery.   -Ct scans every 6 month.  -Colonoscopy scheduled.   -Followed by Oncologist every 3 months.   -Followed by GI   -Also seeing Survivorship.      Reports that she has anxiety due to her move. She is living with her Son in her own apartment at [employer].     Sometimes has bad days due to unusual BM. Stomach gurgling, palpitation.     Problem List:  Patient Active Problem List   Diagnosis    SBO (small bowel obstruction)    Rectal cancer    Thrombocytopenia    History of rectal cancer    Neutropenia, unspecified type       Current  Medications:  Outpatient Medications Marked as Taking for the 05/11/22 encounter (Telemedicine Visit) with Benson Norway, MD   Medication Sig Dispense Refill    ascorbic acid (VITAMIN C) 250 MG tablet Take 1 tablet (250 mg) by mouth      Cholecalciferol (Vitamin D3) 2000 UNIT capsule Take 1 capsule (2,000 Units) by mouth      L-Lysine HCl 500 MG Tab Take by mouth every evening      melatonin 3 mg tablet Take 1 tablet (3 mg) by mouth nightly      Multiple Vitamins-Minerals (Multi Vitamin/Minerals) Tab Take 1 tablet by mouth      omeprazole (PriLOSEC) 20 MG capsule Take 1 capsule (20 mg) by mouth      Wheat Dextrin (BENEFIBER PO) Take by mouth      zinc gluconate 50 MG tablet Take 1 tablet (50 mg) by mouth         Allergies:  Allergies   Allergen Reactions    Penicillins Rash       Past Medical History:  Past Medical  History:   Diagnosis Date    Rectal adenocarcinoma     Small bowel obstruction        Past Surgical History:  Past Surgical History:   Procedure Laterality Date    ROBOTIC, COLECTOMY, LAR  10/21/2020    TAKEDOWN ILEOSTOMY  03/24/2021       Family History:  Family History   Problem Relation Age of Onset    Rheum arthritis Mother        Social History:  Social History     Tobacco Use    Smoking status: Never    Smokeless tobacco: Never   Vaping Use    Vaping status: Never Used   Substance Use Topics    Alcohol use: Yes     Comment: 1-2 a month of wine    Drug use: Never          The following sections were reviewed this encounter by the provider:          ROS:  Review of Systems   General/Constitutional:   Denies Chills. Denies Fatigue. Denies Fever.   Ophthalmologic:   Denies Blurred vision.   ENT:   Denies Nasal Discharge. Denies Sinus pain. Denies Sore throat.   Respiratory:   Denies Cough. Denies Shortness of breath. Denies Wheezing.   Cardiovascular:   Denies Chest pain. Denies Chest pain with exertion. Denies Palpitations. Denies  Swelling in hands/feet.   Gastrointestinal:   Denies Abdominal pain.  Denies Constipation. Denies Diarrhea. Denies Nausea. Denies  Vomiting.   Skin:   Denies Rash.   Neurologic:   Denies Dizziness. Denies Headache. Denies Tingling/Numbness.     Objective:   Vitals:  There were no vitals taken for this visit.      Physical Exam:  General Examination:   Physical Exam   GENERAL APPEARANCE: alert, in no acute distress, appearing at baseline    Assessment:       1. ANA positive  - Referral to Rheumatology    2. Osteopenia of both hands  - Referral to Rheumatology    3. Postmenopausal  - DXA Bone Density Axial Skeleton; Future  - DXA Bone Density Axial Skeleton    4. Polyarthritis or polyarthropathy of hand  - Referral to Rheumatology      Time spent in discussion: 15 minutes   Plan:     As above     Follow-up:   No follow-ups on file.       Benson Norway, MD

## 2022-05-11 NOTE — Progress Notes (Signed)
Have you seen any specialists/other providers since your last visit with [place]?    No    Do you agree to telemedicine visit?  Yes      Are you going to be on the state of [place] during this telemedicine appointment?  Yes  Health Maintenance Due   Topic Date Due    [place]  Never done    Colorectal Cancer Screening  Never done    Advance Directive on File  Never done    MAMMOGRAM  Never done    DXA Scan  Never done    Medicare Annual Wellness Visit  Never done    Shingrix Vaccine 50+ (1) Never done    Tetanus Ten-Year  Never done    HEPATITIS C SCREENING  Never done    Pneumonia Vaccine Age 54+ (1 of 1 - PCV) Never done    COVID-19 Vaccine (4 - 2023-24 season) 10/09/2021

## 2022-05-12 LAB — CYCLIC CITRUL PEPTIDE ANTIBODY, IGG: Cyclic Citrullinated Peptide AB: <16 Units (ref <20)

## 2022-05-13 ENCOUNTER — Telehealth (INDEPENDENT_AMBULATORY_CARE_PROVIDER_SITE_OTHER): Payer: Self-pay

## 2022-05-13 NOTE — Telephone Encounter (Signed)
[facility]  Oncology Nurse Navigator   Voicemail Encounter  May 13, 2022       Attempted to call patient to check in.  Voicemail left with name, role and callback number. Remain available for contact.    Toula Moos, BSN, RN  Oncology Nurse Navigator  [facility]  [phone]

## 2022-05-17 ENCOUNTER — Telehealth (INDEPENDENT_AMBULATORY_CARE_PROVIDER_SITE_OTHER): Payer: Self-pay

## 2022-05-17 NOTE — Telephone Encounter (Signed)
[facility]  Oncology Nurse Navigator   Phone Call Encounter  May 17, 2022       Attempted to call patient to check in.  Voicemail left with name and callback number. ONN remains available for contact.    Campbell Stall, BSN, RN  Oncology Nurse Navigator  [facility]  [phone]

## 2022-05-28 NOTE — Telephone Encounter (Signed)
Reviewed note and agree upon plan of care.    Shawnise Peterkin, MSW, LCSW, OSW-C  Social Work Supervisor

## 2022-05-29 ENCOUNTER — Encounter (INDEPENDENT_AMBULATORY_CARE_PROVIDER_SITE_OTHER): Payer: Self-pay | Admitting: Internal Medicine

## 2022-05-30 ENCOUNTER — Encounter (INDEPENDENT_AMBULATORY_CARE_PROVIDER_SITE_OTHER): Payer: Self-pay | Admitting: Internal Medicine

## 2022-06-01 ENCOUNTER — Telehealth (INDEPENDENT_AMBULATORY_CARE_PROVIDER_SITE_OTHER): Payer: Medicare Other | Admitting: Internal Medicine

## 2022-06-01 ENCOUNTER — Telehealth (INDEPENDENT_AMBULATORY_CARE_PROVIDER_SITE_OTHER): Payer: Self-pay | Admitting: Internal Medicine

## 2022-06-01 NOTE — Telephone Encounter (Signed)
Nurse spoke with pt and she stated that provider stated okay to cancel this appt. Due to lab results already discussed with provider earlier.

## 2022-06-08 NOTE — Progress Notes (Signed)
Called patient and she will call back to schedule July appointment .

## 2022-06-09 NOTE — Addendum Note (Signed)
Addended by: Tabytha Gradillas on: 06/09/2022 01:05 PM     Modules accepted: Orders

## 2022-06-10 ENCOUNTER — Ambulatory Visit
Admission: RE | Admit: 2022-06-10 | Discharge: 2022-06-10 | Disposition: A | Discharge status: Home / Self Care | Payer: Medicare Other | Source: Ambulatory Visit | Attending: Hematology | Admitting: Hematology

## 2022-06-10 DIAGNOSIS — C20 Malignant neoplasm of rectum: Secondary | ICD-10-CM

## 2022-06-10 LAB — WHOLE BLOOD CREATININE WITH GFR POCT
Whole Blood Creatinine POCT: 0.7 mg/dL (ref 0.5–1.1)
eGFR: >60 mL/min/{1.73_m2} (ref >=60)

## 2022-06-10 MED ORDER — IOHEXOL 12 MG/ML PO SOLN
1000.0000 mL | Freq: Once | ORAL | Status: AC | PRN
Start: 2022-06-10 — End: 2022-06-10
  Administered 2022-06-10: 1000 mL via ORAL

## 2022-06-10 MED ORDER — IOHEXOL 350 MG/ML IV SOLN
100.0000 mL | Freq: Once | INTRAVENOUS | Status: AC | PRN
Start: 2022-06-10 — End: 2022-06-10
  Administered 2022-06-10: 100 mL via INTRAVENOUS

## 2022-06-14 ENCOUNTER — Encounter (INDEPENDENT_AMBULATORY_CARE_PROVIDER_SITE_OTHER): Payer: Self-pay

## 2022-06-15 ENCOUNTER — Telehealth (INDEPENDENT_AMBULATORY_CARE_PROVIDER_SITE_OTHER): Payer: Self-pay | Admitting: Gastroenterology

## 2022-06-15 NOTE — Dental Procedure Details (Signed)
EXAM NOTE    Chief Condition: no complaint, wants prophylaxis          Past Medical History:   Diagnosis Date   . Acid reflux    . Cancer (CMS/HCC) [facility]) (CMS/HCC)    . History of radiation therapy      History reviewed. No pertinent surgical history.  Social History     Tobacco Use   . Smoking status: Never   . Smokeless tobacco: Never   Substance Use Topics   . Alcohol use: Yes     Alcohol/week: 2.0 standard drinks of alcohol     Types: 2 Glasses of wine per week     No family history on file.  Current Outpatient Medications on File Prior to Visit   Medication Sig Dispense Refill   . cefdinir (OMNICEF) 300 mg capsule      . acetaminophen (TYLENOL) 325 mg tablet Take 650 mg by mouth.     [name] ascorbic acid (VITAMIN C) 250 mg tablet Take 250 mg by mouth.     . calcium carbonate-vitamin D3 500 mg-5 mcg (200 unit) tablet Take 1 tablet by mouth 1 (one) time each day.     . cetirizine (ZyrTEC) 10 mg tablet Take 10 mg by mouth.     . cholecalciferol (VITAMIN D-3) 50 mcg (2,000 unit) capsule Take 2,000 Units by mouth.     . ESTRADIOL VAGL Insert 0.5 g into the vagina.     [name] loperamide (IMODIUM A-D) 2 mg tablet Take 2 mg by mouth 4 (four) times a day if needed.     [name] lysine 500 mg tablet Take by mouth 1 (one) time each day.     . melatonin 3 mg tablet Take 3 mg by mouth.     . multivitamin (THERAGRAN) tablet Take 1 tablet by mouth 1 (one) time each day.     . multivitamin-iron-minerals-folic acid (THERAPEUTIC-M) 16-1.0 mg tablet Take 1 tablet by mouth.     [name] omeprazole (PriLOSEC) 20 mg DR capsule Take 20 mg by mouth.     . zinc gluconate 50 mg tablet Take 50 mg by mouth.       No current facility-administered medications on file prior to visit.       Radiographic Interpretation:   Associated radiographs for today's visit were reviewed and finding(s) were discussed with the patient:  Recurrent decay, Gross decay, Rampant caries, and P.A. lesion    Hard Tissue Exam:  Decay noted - see charting and treatment  plan      Perio:  Pocket Charting:  Full mouth pocket charting was completed  Radiographic studies shows generalized mild horizontal bone loss with generalized mild vertical bone loss  Perio Diagnosis:  Stage ll, Grade B Periodontitis, Distribution:  Generalized  Patient has light plaque and calculus build up  Patient's oral hygiene is Fair    TMJ:  TMJ Clicking: TMJ clicking WNL  TMJ Pain: TMJ Pain WNL    Oral cancer screening:  Performed oral cancer screening with head, including face and mouth, neck & joint exam.  wnl    Review of Systems:  All other systems negative for symptoms after thorough clinical and radiographic examination    After thorough examination, the patient was advised of the following:  Evidence of decay, restorative treatment recommended, Evidence of infection, surgical treatment recommended, Periodontal maintenance and recall recommended, and Periodontal scaling recommended.  Prognosis:  favorable    Treatment Plan:  Indication for root canal treatment, Indication for decay/caries  removal, Indication for specialist consultation/treatment, Indication for periodontal treatment, CCX Perio Maintenance , and Indications for full coverage    Orders Placed This Encounter   Procedures   . 5 MOD AMALGAM FILLING   . 4 ZIRCONIA CROWN   . 3 ZIRCONIA CROWN   . 15 ZIRCONIA CROWN   . 14 MODL COMPOSITE FILLING   . 12 MOD COMPOSITE FILLING   . 13 MOD COMPOSITE FILLING   . 31 ZIRCONIA CROWN   . 30 ZIRCONIA CROWN   . 29 DO COMPOSITE FILLING   . 18 ZIRCONIA CROWN   . 19 ZIRCONIA CROWN   . 20 MOD AMALGAM FILLING   . 21 DO AMALGAM FILLING   . 2 ZIRCONIA CROWN   . COMPREHENSIVE ORAL EVALUATION - NEW OR ESTABLISHED PATIENT   . [place]AM CT CAPTURE AND INTERPRETATION WITH FIELD OF VIEW OF BOTH JAWS; WITH OR WITHOUT CRANIUM   . BITEWINGS - FOUR RADIOGRAPHIC IMAGES   . SINGLE X-RAY   . ADDITIONAL X-RAY   . ADDITIONAL X-RAY   . ADDITIONAL X-RAY   . ADDITIONAL X-RAY   . ADDITIONAL X-RAY   . INTRAORAL PHOTO   . INTRAORAL  PHOTO   . INTRAORAL PHOTO   . INTRAORAL PHOTO   . 2 PULPAL DEBRIDEMENT, PRIMARY AND PERMANENT TEETH   . 2 SEDATIVE FILLING   . 3 UR PERIODONTAL SCALING AND ROOT PLANING - ONE TO THREE TEETH PER QUADRANT   . UR ANTIBACT IRR/QUAD   . UR BAC DECON ONE TO THREE TEETH/QUAD   . ORAL HYGIENE INSTRUCTIONS   . 30 LR PERIODONTAL SCALING AND ROOT PLANING - ONE TO THREE TEETH PER QUADRANT   . LR ANTIBACT IRR/QUAD   . LR BAC DECON ONE TO THREE TEETH/QUAD   . 14 UL PERIODONTAL SCALING AND ROOT PLANING - ONE TO THREE TEETH PER QUADRANT   . UL ANTIBACT IRR/QUAD   . UL BAC DECON ONE TO THREE TEETH/QUAD   . 19 LL PERIODONTAL SCALING AND ROOT PLANING - ONE TO THREE TEETH PER QUADRANT   . LL ANTIBACT IRR/QUAD   . LL BAC DECON ONE TO THREE TEETH/QUAD   . ADJUNCTIVE PRE-DIAGNOSTIC TEST THAT AIDS IN DETECTION OF MUCOSAL ABNORMALITIES   . NC X-RAY   . 2 ENDODONTIC THERAPY, MOLAR TOOTH (EXCLUDING FINAL RESTORATION)   . LIMITED ORAL EVALUATION - PROBLEM FOCUSED   . 2 PULP VITALITY TESTS   . 2 TREATMENT OF ROOT CANAL OBSTRUCTION; NON-SURGICAL ACCESS   . 2 INTRAORIFICE BARRIER   . NC X-RAY   . 2 CORE BUILDUP, INCLUDING ANY PINS WHEN REQUIRED   . 2 CEMENT CROWN   . 2 CEREC CROWN POST

## 2022-06-15 NOTE — Dental Procedure Details (Signed)
CBCT PROCEDURE NOTE      CBCT completed for both jaws, with or without cranium-w/interpretation (Z6109)  CBCT was ordered to assess TMJ and assess and assist with diagnosis and treatment planning    Findings revealed periapical cyst at the apex of tooth and periapical lesion associated with the apical area of tooth  Location is full mouth, both jaws  Tooth No(s) All remaining teeth    CBCT images/impressions revealed Periapical abscess with sinus (K04.6)

## 2022-06-15 NOTE — Telephone Encounter (Signed)
LVM to confirm CLN on 5/9.

## 2022-06-16 ENCOUNTER — Encounter (INDEPENDENT_AMBULATORY_CARE_PROVIDER_SITE_OTHER): Payer: Self-pay

## 2022-06-16 ENCOUNTER — Telehealth (INDEPENDENT_AMBULATORY_CARE_PROVIDER_SITE_OTHER): Payer: Medicare Other

## 2022-06-16 DIAGNOSIS — J0140 Acute pansinusitis, unspecified: Secondary | ICD-10-CM

## 2022-06-16 MED ORDER — METHYLPREDNISOLONE 4 MG PO TBPK
ORAL_TABLET | ORAL | 0 refills | Status: AC
Start: 2022-06-16 — End: 2022-06-22

## 2022-06-16 MED ORDER — AZITHROMYCIN 250 MG PO TABS
ORAL_TABLET | ORAL | 0 refills | Status: AC
Start: 2022-06-16 — End: 2022-06-22

## 2022-06-16 NOTE — Progress Notes (Signed)
[facility]                       Date of Exam: 06/16/2022 10:04 AM        Patient ID: Aimee Watson is a 67 y.o. female.  Provider: Quay Burow, DNP FNP      This visit was conducted with the use of interactive video and audio telecommunication that permitted real time communication between [facility] and Quay Burow, DNP FNP. She consented to participation and received services at home to minimize exposure to COVID-19 while I was located at my office at [facility].         Chief Complaint:    Chief Complaint   Patient presents with    Sinus Problem     Pain under eyes, nose    Nasal Congestion               HPI:    Sinus Problem  Associated symptoms include congestion, coughing (mild) and sinus pressure. Pertinent negatives include no chills, diaphoresis, ear pain, headaches, shortness of breath or sore throat.   67 y.o.  presents via telemedicine reporting symptoms of nasal congestion, sinus pressure and pain in maxillary and facial area for about 10 days. Reports she went to her dentist where x-ray was done where dentist mentioned she has a sinus infection and should see her PCP for treatment. States she has been taking Zyrtec and Flonase with only some relief. Denies CP, SOB, wheezing, fever, chills, body/muscle aches, HA, N/V, diarrhea, earache, sore throat, or other symptoms.              Problem List:    Patient Active Problem List   Diagnosis    SBO (small bowel obstruction)    Rectal cancer    History of rectal cancer    Neutropenia, unspecified type             Current Meds:    Outpatient Medications Marked as Taking for the 06/16/22 encounter (Telemedicine Visit) with Quay Burow, DNP FNP   Medication Sig Dispense Refill    ascorbic acid (VITAMIN C) 250 MG tablet Take 1 tablet (250 mg) by mouth      Cholecalciferol (Vitamin D3) 2000 UNIT capsule Take 1 capsule (2,000 Units) by mouth      L-Lysine HCl 500 MG Tab Take by mouth every  evening      Multiple Vitamins-Minerals (Multi Vitamin/Minerals) Tab Take 1 tablet by mouth      omeprazole (PriLOSEC) 20 MG capsule Take 1 capsule (20 mg) by mouth      vitamin B-12 (CYANOCOBALAMIN) 1000 MCG tablet Take 1 tablet (1,000 mcg) by mouth daily Gummies      Wheat Dextrin (BENEFIBER PO) Take by mouth      zinc gluconate 50 MG tablet Take 1 tablet (50 mg) by mouth            Allergies:    Allergies   Allergen Reactions    Penicillins Rash             Past Surgical History:    Past Surgical History:   Procedure Laterality Date    ROBOTIC, COLECTOMY, LAR  10/21/2020    TAKEDOWN ILEOSTOMY  03/24/2021           Family History:    Family History   Problem Relation Age of Onset    Rheum arthritis Mother  Social History:    Social History     Tobacco Use    Smoking status: Never    Smokeless tobacco: Never   Vaping Use    Vaping status: Never Used   Substance Use Topics    Alcohol use: Yes     Comment: 1-2 a month of wine    Drug use: Never           The following sections were reviewed this encounter by the provider:   Tobacco  Allergies  Meds  Problems  Med Hx  Surg Hx  Fam Hx             Vital Signs:    There were no vitals taken for this visit.         ROS:    Review of Systems   Constitutional:  Negative for chills, diaphoresis and fever.   HENT:  Positive for congestion, rhinorrhea, sinus pressure and sinus pain. Negative for ear discharge, ear pain, sore throat and trouble swallowing.    Respiratory:  Positive for cough (mild). Negative for shortness of breath.    Cardiovascular:  Negative for chest pain.   Gastrointestinal:  Negative for diarrhea, nausea and vomiting.   Neurological:  Negative for headaches.                Physical Exam:    Physical Exam  Constitutional:       General: She is not in acute distress.     Appearance: She is not ill-appearing, toxic-appearing or diaphoretic.   Pulmonary:      Effort: Pulmonary effort is normal. No respiratory distress.      Comments: Patient  is able to speak in full sentences without issues.  Neurological:      Mental Status: She is alert.   Psychiatric:         Attention and Perception: Attention normal.         Mood and Affect: Mood normal.         Speech: Speech normal.         Behavior: Behavior is cooperative.                Assessment/Plan:    1. Acute non-recurrent pansinusitis  - azithromycin (ZITHROMAX) 250 MG tablet; Take 2 tablets (500 mg) on  Day 1,  followed by 1 tablet (250 mg) once daily on Days 2 through 5.  Dispense: 6 tablet; Refill: 0  - methylPREDNISolone (MEDROL DOSEPAK) 4 MG tablet; Follow Package Directions.  Dispense: 21 tablet; Refill: 0  - Take medications as prescribed; reviewed SE  - Use a humidifier at bedtime and Flonase to reduce nasal congestion and inflammation  - Increase your fluid and electrolyte intake to prevent dehydration  - Consume warm fluids and over-the-counter cough drops for cough relief  - Have adequate amount of rest  - Make sure to eat nutritious meals and snacks throughout the day  - Use OTC Tylenol/Ibuprofen as needed to manage pain and fever  - ED precautions reviewed; pt verbalized understanding  - Patient agreeable with plan, all questions answered.  - Notify the clinic if symptoms worsen, persist, or onset of new symptoms occur    Reviewed treatment plan inclusive of risks and benefits. Patient agreeable with plan, all questions answered. ED precautions reviewed; patient verbalized understanding.  Time spent in discussion: 8 minutes            Follow-up:    Return if symptoms worsen or fail to  improve.             Jay Schlichter, DNP FNP

## 2022-06-17 ENCOUNTER — Encounter
Admission: RE | Disposition: A | Discharge status: Home / Self Care | Payer: Self-pay | Source: Ambulatory Visit | Attending: Gastroenterology

## 2022-06-17 ENCOUNTER — Ambulatory Visit: Payer: Medicare Other | Admitting: Anesthesiology

## 2022-06-17 ENCOUNTER — Encounter: Payer: Self-pay | Admitting: Gastroenterology

## 2022-06-17 ENCOUNTER — Ambulatory Visit
Admission: RE | Admit: 2022-06-17 | Discharge: 2022-06-17 | Disposition: A | Discharge status: Home / Self Care | Payer: Medicare Other | Source: Ambulatory Visit | Attending: Gastroenterology | Admitting: Gastroenterology

## 2022-06-17 ENCOUNTER — Ambulatory Visit: Payer: Medicare Other | Admitting: Hematology

## 2022-06-17 DIAGNOSIS — Z85048 Personal history of other malignant neoplasm of rectum, rectosigmoid junction, and anus: Secondary | ICD-10-CM | POA: Insufficient documentation

## 2022-06-17 DIAGNOSIS — Z79899 Other long term (current) drug therapy: Secondary | ICD-10-CM | POA: Insufficient documentation

## 2022-06-17 DIAGNOSIS — Z08 Encounter for follow-up examination after completed treatment for malignant neoplasm: Secondary | ICD-10-CM | POA: Insufficient documentation

## 2022-06-17 DIAGNOSIS — C20 Malignant neoplasm of rectum: Secondary | ICD-10-CM | POA: Insufficient documentation

## 2022-06-17 DIAGNOSIS — Z980 Intestinal bypass and anastomosis status: Secondary | ICD-10-CM | POA: Insufficient documentation

## 2022-06-17 HISTORY — PX: COLONOSCOPY, SCREENING: SHX00150

## 2022-06-17 SURGERY — COLONOSCOPY, SCREENING
Anesthesia: Anesthesia General | Site: Anus

## 2022-06-17 MED ORDER — SIMETHICONE 40 MG/0.6ML PO SUSP
ORAL | Status: DC | PRN
Start: 2022-06-17 — End: 2022-06-17
  Administered 2022-06-17: 40 mg

## 2022-06-17 MED ORDER — PROPOFOL 10 MG/ML IV EMUL (WRAP)
INTRAVENOUS | Status: DC | PRN
Start: 2022-06-17 — End: 2022-06-17
  Administered 2022-06-17: 120 mg via INTRAVENOUS

## 2022-06-17 MED ORDER — PROPOFOL 10 MG/ML IV EMUL (WRAP)
INTRAVENOUS | Status: AC
Start: 2022-06-17 — End: ?
  Filled 2022-06-17: qty 50

## 2022-06-17 MED ORDER — PROPOFOL INFUSION 10 MG/ML
INTRAVENOUS | Status: DC | PRN
Start: 2022-06-17 — End: 2022-06-17
  Administered 2022-06-17: 150 ug/kg/min via INTRAVENOUS

## 2022-06-17 MED ORDER — LACTATED RINGERS IV SOLN
INTRAVENOUS | Status: DC
Start: 2022-06-17 — End: 2022-06-17

## 2022-06-17 SURGICAL SUPPLY — 53 items
ADAPTER WASHER/DISINFECTOR AIR WATER CLEAN BIOGUARD OLYMPUS (Adapter) ×1 IMPLANT
ADAPTER WSHR/DSINF BGRD AIR H2O CLN DISP (Adapter) ×1
BRUSH ENDOSCOPE CLEANING 2 END CHANNEL (Brushes)
BRUSH ENDOSCOPE CLEANING 2 END CHANNEL VALVE HEDGEHOGâ„¢ (Brushes) IMPLANT
CONNECTOR IRRIGATION AUXILIARY WATER JET (Connector) ×1
CONNECTOR IRRIGATION AUXILIARY WATER JET 1 WAY VALVE HYDRA (Connector) ×1 IMPLANT
CONTAINER HISTOLOGY 60 ML 30 ML GRADUATE LEAK RESISTANT O RING PREFILL (Procedure Accessories) IMPLANT
ELECTRODE ADULT PATIENT RETURN L9 FT REM POLYHESIVE ACRYLIC FOAM (Procedure Accessories) IMPLANT
ELECTRODE PATIENT RETURN L9 FT VALLEYLAB (Procedure Accessories)
FORCEPS BIOPSY L240 CM JUMBO NEEDLE (Procedure Accessories)
FORCEPS BIOPSY L240 CM JUMBO NEEDLE RADIAL JAW (Procedure Accessories) IMPLANT
FORCEPS BIOPSY L240 CM STANDARD CAPACITY (Disposable Instruments)
FORCEPS BIOPSY L240 CM STANDARD CAPACITY NEEDLE OD2.2 MM RADIAL JAW (Disposable Instruments) IMPLANT
GOWN CHEMOTHERAPY L47 IN X W28 IN UNIVERSAL OPEN BACK OVERHEAD KNIT (Gown) ×2 IMPLANT
GOWN CHMO PP PE UNV 47X28IN LF OPN BCK (Gown) ×2
GOWN COVER XL FULL BACK NECK TIE ELASTIC (Personal Protection)
GOWN COVER XL FULL BACK NECK TIE ELASTIC CUFF MEDLINE SMS YELLOW (Personal Protection) IMPLANT
INJ SUBMUCOSAL COMPOSITION 5 10-ML AMP (Procedure Accessories) IMPLANT
KIT ENDOSCOPIC COMPLIANCE ENDOKIT (Kits) ×1
KIT ENDOSCOPIC COMPLIANCE ENDOKIT ORCAPOD 3 1.1 OZ (Kits) ×1 IMPLANT
LIFTER BLUEBOOST 10ML SUBMUCOSAL SYRINGE (Endoscopic Supplies) IMPLANT
MANIFOLD SUCTION 2 STANDARD 4 PORT (Filter) ×1
MANIFOLD SUCTION 2 STANDARD 4 PORT NEPTUNE 2 WASTE MANAGEMENT SYSTEM (Filter) ×1 IMPLANT
MARKER ENDOSCOPIC PERMANENT INDICATION (Syringes, Needles)
MARKER ENDOSCOPIC PERMANENT INDICATION DARK SYRINGE SPOT EX 5 ML (Syringes, Needles) IMPLANT
NEEDLE SCLEROTHERAPY CARR-LOCKE OD25 GA ODSEC2.5 MM L230 CM INJECTION (Needles) IMPLANT
NEEDLE SCLEROTHERAPY OD25 GA ODSEC2.5 MM (Needles)
RETRIEVER ENDOSCOPIC L230 CM 360 D OD2.5 (Endoscopic Supplies)
RETRIEVER ENDOSCOPIC L230 CM 360 D OD2.5 MM L5 CM X W3 CM (Endoscopic Supplies) IMPLANT
SNARE 2.8 CM ROUND L240 CM OD10 MM (Endoscopic Supplies)
SNARE 2.8 CM ROUND L240 CM OD10 MM ODSEC2.4 MM CAPTIVATOR LOOP STIFF (Endoscopic Supplies) IMPLANT
SNARE ESCP MIC CPTVTR 13MM 240IN STRL (GE Lab Supplies)
SNARE ESCP RND CPTVTR 2.4MM 240CM CLD (Procedure Accessories)
SNARE ESCP SM OVL CPTFLX 2.4MM 240CM (Endoscopic Supplies)
SNARE MD OVAL 240CM 2.4 MM CPTFLX LP FLXBL ENDOSCOPIC POLYPECTOMY 27MM (Endoscopic Supplies) IMPLANT
SNARE MEDIUM OVAL L240 CM OD2.4 MM (Endoscopic Supplies)
SNARE OD10 MM CAPTIVATOR ENDOSCOPIC POLYPECTOMY (Procedure Accessories) IMPLANT
SNARE OD13 MM CAPTIFLEX ENDOSCOPIC POLYPECTOMY (Endoscopic Supplies) IMPLANT
SNARE SMALL HEXAGON CAPTIVATOR STIFF ENDOSCOPIC POLYPECTOMY (GE Lab Supplies) IMPLANT
SOL FORMALIN 10% PREFILL 30ML (Procedure Accessories)
SPONGE GAUZE L4 IN X W4 IN 12 PLY (Sponge) IMPLANT
SYRINGE 50 ML GRADUATE NONPYROGENIC DEHP (Syringes, Needles) ×1
SYRINGE 50 ML GRADUATE NONPYROGENIC DEHP FREE PVC FREE BD MEDICAL (Syringes, Needles) ×1 IMPLANT
TIP COLONOSCOPE LARGE FLEXIBLE ARM ID11.2 MM ENDOCUFF VISION GREEN (Procedure Accessories) IMPLANT
TIP CSCP LG ENDOCUFF VSN 11.2MM LF STRL (Procedure Accessories)
TRAP SUCTION SPECIMEN POLYP STONE (Procedure Accessories) IMPLANT
TUBING SUCTION OD3/16 IN L12 FT FEMALE (Tubing) ×1
TUBING SUCTION OD3/16 IN L12 FT FEMALE CONNECTOR RIBBED UNIVERSAL (Tubing) ×1 IMPLANT
VALVE SUCTION ORCAPOD ORCA SEAL AIR (Endoscopic Supplies)
VALVE SUCTION ORCAPOD ORCA SEAL AIR WATER SILICONE FREE ENCLOSE SPRING (Endoscopic Supplies) IMPLANT
WATER STERILE PLASTIC POUR BOTTLE 250 ML (Irrigation Solutions) IMPLANT
WATER STERILE PLASTIC POUR BOTTLE 500 ML (Irrigation Solutions) IMPLANT
WATER STERILE PVC FREE DEHP FREE 1000 ML (Solution) ×2 IMPLANT

## 2022-06-17 NOTE — Anesthesia Postprocedure Evaluation (Signed)
Anesthesia Post Evaluation    Patient: Aimee Watson    Procedure(s):  COLONOSCOPY, SCREENING    Anesthesia type: general    Last Vitals:   Vitals Value Taken Time   BP 144/76 06/17/22 1130   Temp 36.2 C (97.2 F) 06/17/22 1120   Pulse 72 06/17/22 1130   Resp 21 06/17/22 1130   SpO2 99 % 06/17/22 1130                 Anesthesia Post Evaluation:     Patient Evaluated: PACU  Patient Participation: complete - patient participated  Level of Consciousness: awake and alert    Pain Management: adequate    Airway Patency: patent        Anesthetic complications: No      PONV Status: none    Cardiovascular status: acceptable  Respiratory status: acceptable  Hydration status: acceptable          Signed by: Henreitta Leber, MD, 06/17/2022 11:39 AM

## 2022-06-17 NOTE — Transfer of Care (Signed)
Anesthesia Transfer of Care Note    Patient: Aimee Watson    Procedures performed: Procedure(s):  COLONOSCOPY, SCREENING    Anesthesia type: General TIVA    Patient location:Phase II PACU    Last vitals:   Vitals:    06/17/22 0948   BP: 169/74   Pulse: 68   Resp: 16   Temp: 36.4 C (97.5 F)   SpO2: 100%       Post pain: Patient not complaining of pain, continue current therapy      Mental Status:awake and alert     Respiratory Function: tolerating room air    Cardiovascular: stable    Nausea/Vomiting: patient not complaining of nausea or vomiting    Hydration Status: adequate    Post assessment: no apparent anesthetic complications, no reportable events, and no evidence of recall    Signed by: Henreitta Leber, MD  06/17/22 11:21 AM

## 2022-06-17 NOTE — Discharge Instr - AVS First Page (Addendum)
Reason for your Hospital Admission:  COLONOSCOPY      Anesthesia General Instructions:    Following sedation, your judgment, perception, and coordination are considered impaired. Even though you may feel awake and alert, you are considered legally intoxicated. Therefore, until the next morning:  Do not drive  Do not operate appliances or equipment that requires reaction time (e.g. cooking, cleaning, electrical tools, machinery, walking the dog)  Do not sign legal documents or be involved in important decisions  Do not smoke if alone  Do not drink alcoholic beverages  Do not eat anything spicy or very greasy.    General Instructions:    Go directly home and rest for several hours before resuming your routine activities  It is highly recommended to have a responsible adult stay with you for the next 24 hours    Tenderness, swelling, or pain may occur at the IV site where you received sedation. If you experience this, apply warm soaks to the area. Notify your physician if this persists.   If you have questions or problems contact your MD immediately. If you need immediate attention, call your MD, 911 and/or go to the nearest emergency room.       Report to the Physician any of the following:    For Colonoscopy, Sigmoidoscopy, or Proctoscopy:  Severe and persistent abdominal pain/bloating which does not subside within 2-3 hours  Large amount of rectal bleeding (some mucous blood streaking may occur, especially if biopsy or polypectomy was done or if hemorrhoids are present  Nausea/vomiting  Fever/chills within 24 hours after procedure. Temp > 101 degrees Farenheit    * If polyp has been removed, DO NOT take aspirin or aspirin-containing products (e.g. Anacin, Alka Seltzer, Bufferin, etc) or non-steroidal anti-inflammatory drugs (e.g. Advil, Motrin, etc) for 2 days unless otherwise advised by doctor. Tylenol or Extra Strength Tylenol is permitted.   *Special instructions: Follow MD instructions on procedural  report.      If you have obstructive sleep apnea (OSA)  You were given anesthesia medicine during surgery to keep you comfortable and free of pain. After surgery, you may have more apnea spells because of this medicine and other medicines you were given. The spells may last longer than usual.   At home:   Keep using the continuous positive airway pressure (CPAP) device when you sleep. Unless your health care provider tells you not to, use it when you sleep,        or take a nap; day or night. CPAP is a common device used to treat OSA.  You are to sleep upright- with HOB > 30 degrees, with pillows; or use a recliner          for the first week or while on opioids or medications that are sedating.  Another adult needs to be with you during first 24 hours home after surgery.  Limit opioid use when possible and use alternative comfort measures.  Talk with your provider before taking any pain medicine, muscle relaxants, or sedatives. Your provider will tell you about the possible dangers of taking these medicines.

## 2022-06-17 NOTE — H&P (Addendum)
GI PRE PROCEDURE NOTE      Proceduralist Comments:   Review of Systems and Past Medical / Surgical History performed: Yes     Indications: Colonoscopy: Surveillance (history of rectal cancer)    Current Outpatient Medications   Medication Instructions    ascorbic acid (VITAMIN C) 250 mg, Oral    azithromycin (ZITHROMAX) 250 MG tablet Take 2 tablets (500 mg) on  Day 1,  followed by 1 tablet (250 mg) once daily on Days 2 through 5.    L-Lysine HCl 500 MG Tab Oral, Every evening    melatonin 3 mg, Oral, At bedtime    methylPREDNISolone (MEDROL DOSEPAK) 4 MG tablet Follow Package Directions.    Multiple Vitamins-Minerals (Multi Vitamin/Minerals) Tab 1 tablet, Oral    omeprazole (PRILOSEC) 20 mg, Oral    vitamin B-12 (CYANOCOBALAMIN) 1,000 mcg, Oral, Daily, Gummies     Vitamin D3 2,000 Units, Oral    Wheat Dextrin (BENEFIBER PO) Oral    zinc gluconate 50 mg, Oral        Allergies   Allergen Reactions    Penicillins Rash       Family History - non-contributory    Review of Systems - General ROS: negative for - chills, fever or malaise  Respiratory ROS: no cough, shortness of breath, or wheezing  Cardiovascular ROS: no chest pain or dyspnea on exertion  Gastrointestinal ROS: no abdominal pain, change in bowel habits, or black or bloody stools      Physical Exam / Laboratory Data (If applicable)       General: Alert and cooperative  Lungs: Lungs clear to auscultation  Cardiac: RRR   Abdomen: Soft, non tender.       History of rectal cancer [Z85.048]  Rectal cancer [C20]        Planned Sedation:   Monitored anesthesia care    Attestation:   Iver Nestle has been reassessed immediately prior to the procedure and is an appropriate candidate for the planned sedation and procedure. Risks, benefits and alternatives to the planned procedure and sedation have been explained to the patient or guardian:  yes        Signed by: Guadalupe Dawn, MD

## 2022-06-17 NOTE — Anesthesia Preprocedure Evaluation (Addendum)
Anesthesia Evaluation    AIRWAY    Mallampati: II    TM distance: >3 FB  Neck ROM: full  Mouth Opening:full   CARDIOVASCULAR    cardiovascular exam normal       DENTAL    no notable dental hx               PULMONARY    pulmonary exam normal     OTHER FINDINGS    Sinusitis - has not started antibiotics  Has runny nose, ears "popping"  Denies fever, sore throat, coughing, fatigue  Denies CP, SOB, snoring  Rare gerd - positional                                      Relevant Problems   No relevant active problems               Anesthesia Plan    ASA 3     general                     intravenous induction           Post op pain management: per surgeon    informed consent obtained                     Signed by: Henreitta Leber, MD 06/17/22 10:10 AM

## 2022-06-18 ENCOUNTER — Encounter: Payer: Self-pay | Admitting: Hematology

## 2022-06-18 ENCOUNTER — Ambulatory Visit: Payer: Medicare Other | Attending: Hematology | Admitting: Hematology

## 2022-06-18 VITALS — BP 130/78 | HR 71 | Temp 98.2°F | Resp 16 | Wt 134.0 lb

## 2022-06-18 DIAGNOSIS — K56609 Unspecified intestinal obstruction, unspecified as to partial versus complete obstruction: Secondary | ICD-10-CM

## 2022-06-18 DIAGNOSIS — C20 Malignant neoplasm of rectum: Secondary | ICD-10-CM

## 2022-06-18 LAB — RRL - CMP
ALT Piccolo(R): <20 U/L (ref 10–47)
AST Piccolo(R): <25 U/L (ref 11–38)
Albumin Piccolo(R): 3.5 g/dL (ref 3.3–5.0)
Alkaline Phosphatase Piccolo(R): 66 U/L (ref 42–141)
BUN Piccolo(R): 14 mg/dL (ref 7.0–19.0)
Bilirubin Total Piccolo(R): 1.1 mg/dL (ref 0.2–1.6)
CO2 Piccolo(R): 33 mEq/L — ABNORMAL HIGH (ref 21–29)
Calcium Piccolo(R): 9.6 mg/dL (ref 8.5–10.5)
Chloride Piccolo(R): 109 mEq/L — ABNORMAL HIGH (ref 98–107)
Creatinine Piccolo(R): 0.8 mg/dL (ref 0.4–1.5)
Glucose Piccolo(R): 100 mg/dL (ref 70–100)
Potassium Piccolo(R): 4.1 mEq/L (ref 3.5–5.1)
Protein Piccolo(R): 7.1 g/dL (ref 6.4–8.1)
Sodium Piccolo(R): 140 mEq/L (ref 136–145)
eGFR: >60 mL/min/{1.73_m2} (ref >=60)

## 2022-06-18 LAB — CBC AND DIFFERENTIAL
Absolute NRBC: 0 10*3/uL (ref 0.00–0.00)
Basophils Absolute Automated: 0.08 10*3/uL (ref 0.00–0.08)
Basophils Automated: 1.4 %
Eosinophils Absolute Automated: 0.09 10*3/uL (ref 0.00–0.44)
Eosinophils Automated: 1.5 %
Hematocrit: 34.9 % (ref 34.7–43.7)
Hgb: 12 g/dL (ref 11.4–14.8)
Immature Granulocytes Absolute: 0.01 10*3/uL (ref 0.00–0.07)
Immature Granulocytes: 0.2 %
Instrument Absolute Neutrophil Count: 4.08 10*3/uL (ref 1.10–6.33)
Lymphocytes Absolute Automated: 0.99 10*3/uL (ref 0.42–3.22)
Lymphocytes Automated: 17 %
MCH: 30.8 pg (ref 25.1–33.5)
MCHC: 34.4 g/dL (ref 31.5–35.8)
MCV: 89.7 fL (ref 78.0–96.0)
MPV: 10.1 fL (ref 8.9–12.5)
Monocytes Absolute Automated: 0.57 10*3/uL (ref 0.21–0.85)
Monocytes: 9.8 %
Neutrophils Absolute: 4.08 10*3/uL (ref 1.10–6.33)
Neutrophils: 70.1 %
Nucleated RBC: 0 /100 WBC (ref 0.0–0.0)
Platelets: 229 10*3/uL (ref 142–346)
RBC: 3.89 10*6/uL — ABNORMAL LOW (ref 3.90–5.10)
RDW: 13 % (ref 11–15)
WBC: 5.82 10*3/uL (ref 3.10–9.50)

## 2022-06-18 LAB — CEA: CEA: <1.7 ng/mL (ref 0.0–5.0)

## 2022-06-18 NOTE — Progress Notes (Signed)
[facility]  [address]  [place], [place] [address]  P: [phone]  F: [phone]        [facility]  [address] [place], [address]  [place], [place] [address]  P: [phone]  F: [phone]    Gastroenterologist: Dr. Alcide Evener  Surgeon: Dr. Carole Civil    CHIEF COMPLAINT  Rectal cancer    HISTORY OF PRESENT ILLNESS  Aimee Watson is a 67 y.o. woman with a history of rectal cancer.     1) Rectal cancer:  -01/2020 colonoscopy for rectal bleeding revealed a mass 2cm from the anal verge. Biopsy confirmed adenocarcinoma. There was no e/o metastatic disease by CT. An MRI revealed a T2-T3 cancer with involvement of the internal sphincter and 2 positive mesorectal nodes.   -02/2020 neoadjuvant FOLFOX, followed by chemoRT 07/2020-08/2020.   -LAR with coloanal anastomosis and ypT1a/N0 disease (0/3 nodes), a near CR to therapy. She had MSI-L disease.  -03/2021 ostomy reversal   -postop course c/b bladder prolapse and urinary incontinence, frequent and loose bowel movements.    2) SBO:  -9/10-9/12/23 and 10/27-10/29/23 admitted to [facility] with SBO. The transition point was near the ileostomy reversal site. The SBO resolved with conservative management.        INTERIM HISTORY  Aimee Watson returns to the office for follow up.     She had a colonoscopy on 06/17/22 which revealed normal mucosa and she was recommended for 5 year follow up with Dr. Sherryll Burger.    She has persistent symptoms of LAR syndrome. She was referred for pelvic PT but hasn't made an appointment yet. Her bowel symptoms are generally improved. She hasn't had any recurrent SBO. She is eating small meals and walking a lot.    She went to survivorship clinic and also discussed her symptoms of neuropathy. She has an appointment for acupuncture next week. The neuropathy is a little improved since 3 months ago.    In addition to the neuropathy she has had joint pains and stiffness for which she had  ANA and workup with some positive autoimmune markers. She is following up with her PMD about this.    I reviewed notes from the PMD on the date of service.    Allergies   Allergen Reactions    Penicillins Rash     Current Outpatient Medications   Medication Sig Dispense Refill    ascorbic acid (VITAMIN C) 250 MG tablet Take 1 tablet (250 mg) by mouth      azithromycin (ZITHROMAX) 250 MG tablet Take 2 tablets (500 mg) on  Day 1,  followed by 1 tablet (250 mg) once daily on Days 2 through 5. 6 tablet 0    Cholecalciferol (Vitamin D3) 2000 UNIT capsule Take 1 capsule (2,000 Units) by mouth      L-Lysine HCl 500 MG Tab Take by mouth every evening      melatonin 3 mg tablet Take 1 tablet (3 mg) by mouth nightly      methylPREDNISolone (MEDROL DOSEPAK) 4 MG tablet Follow Package Directions. 21 tablet 0    Multiple Vitamins-Minerals (Multi Vitamin/Minerals) Tab Take 1 tablet by mouth      omeprazole (PriLOSEC) 20 MG capsule Take 1 capsule (20 mg) by mouth      vitamin B-12 (CYANOCOBALAMIN) 1000 MCG tablet Take 1 tablet (1,000 mcg) by mouth daily Gummies      Wheat Dextrin (BENEFIBER PO) Take by mouth      zinc gluconate 50  MG tablet Take 1 tablet (50 mg) by mouth       No current facility-administered medications for this visit.     Past Medical History:   Diagnosis Date    Rectal adenocarcinoma     Small bowel obstruction      Past Surgical History:   Procedure Laterality Date    ROBOTIC, COLECTOMY, LAR  10/21/2020    [place] ILEOSTOMY  03/24/2021     Family History   Problem Relation Age of Onset    Rheum arthritis Mother      Social History     Socioeconomic History    Marital status: Widowed   Tobacco Use    Smoking status: Never    Smokeless tobacco: Never   Vaping Use    Vaping status: Never Used   Substance and Sexual Activity    Alcohol use: Yes     Comment: 1-2 a month of wine    Drug use: Never    Sexual activity: Not Currently     Social Determinants of Health     Financial Resource Strain: Low Risk  (06/14/2022)     Overall Financial Resource Strain (CARDIA)     Difficulty of Paying Living Expenses: Not hard at all   Food Insecurity: No Food Insecurity (06/14/2022)    Hunger Vital Sign     Worried About Running Out of Food in the Last Year: Never true     Ran Out of Food in the Last Year: Never true   Transportation Needs: No Transportation Needs (06/14/2022)    PRAPARE - Therapist, art (Medical): No     Lack of Transportation (Non-Medical): No   Physical Activity: Sufficiently Active (06/14/2022)    Exercise Vital Sign     Days of Exercise per Week: 4 days     Minutes of Exercise per Session: 60 min   Stress: No Stress Concern Present (06/14/2022)    [employer] of Occupational Health - Occupational Stress Questionnaire     Feeling of Stress : Only a little   Social Connections: Moderately Isolated (06/14/2022)    Social Connection and Isolation Panel [NHANES]     Frequency of Communication with Friends and Family: More than three times a week     Frequency of Social Gatherings with Friends and Family: More than three times a week     Attends Religious Services: More than 4 times per year     Active Member of [employer] or Organizations: No     Attends Banker Meetings: Patient declined     Marital Status: Widowed   Intimate Partner Violence: Not At Risk (06/14/2022)    Humiliation, Afraid, Rape, and Kick questionnaire     Fear of Current or Ex-Partner: No     Emotionally Abused: No     Physically Abused: No     Sexually Abused: No   Housing Stability: Low Risk  (06/14/2022)    Housing Stability Vital Sign     Unable to Pay for Housing in the Last Year: No     Number of Places Lived in the Last Year: 2     Unstable Housing in the Last Year: No     Review of Systems    Objective:     Vitals:    06/18/22 0929   BP: 130/78   Pulse: 71   Resp: 16   Temp: 98.2 F (36.8 C)   SpO2: 99%     Physical  Exam  Constitutional:       General: She is not in acute distress.     Appearance: Normal appearance.    HENT:      Head: Normocephalic.   Eyes:      General: No scleral icterus.     Extraocular Movements: Extraocular movements intact.   Cardiovascular:      Rate and Rhythm: Normal rate.   Pulmonary:      Effort: Pulmonary effort is normal. No respiratory distress.   Musculoskeletal:         General: No swelling.   Neurological:      Mental Status: She is alert and oriented to person, place, and time. Mental status is at baseline.         I personally reviewed the data below.    LAB DATA:   Component      Latest Ref Rng 06/18/2022   WBC      3.10 - 9.50 x10 3/uL 5.82    Hemoglobin      11.4 - 14.8 g/dL 11.9    Hematocrit      34.7 - 43.7 % 34.9    Platelet Count      142 - 346 x10 3/uL 229        Component      Latest Ref Rng 06/18/2022   Glucose Piccolo(R)      70 - 100 mg/dL 147    BUN Piccolo(R)      7.0 - 19.0 mg/dL 82.9    Creatinine Piccolo(R)      0.4 - 1.5 mg/dL 0.8    Sodium Piccolo(R)      136 - 145 mEq/L 140    Potassium Piccolo(R)      3.5 - 5.1 mEq/L 4.1    Chloride Piccolo(R)      98 - 107 mEq/L 109 (H)    CO2 Piccolo(R)      21 - 29 mEq/L 33 (H)    Calcium Piccolo(R)      8.5 - 10.5 mg/dL 9.6    Protein Piccolo(R)      6.4 - 8.1 g/dL 7.1    Albumin Piccolo(R)      3.3 - 5.0 g/dL 3.5    AST Piccolo(R)      11 - 38 U/L <25    ALT Piccolo(R)      10 - 47 U/L <20    Alkaline Phosphatase Piccolo(R)      42 - 141 U/L 66    Bilirubin Total Piccolo(R)      0.2 - 1.6 mg/dL 1.1           RADIOLOGY DATA:     06/10/22 CT C/A/P  IMPRESSION:    No evidence of metastatic disease.    Assessment & Plan:   Aimee Watson is a 67 year old woman with a history of stage 3 (T3/N1) rectal cancer, s/p total neoadjuvant therapy and LAR. She is 18 months out from completion of therapy.      1) Rectal cancer:  -she had a near CR to treatment with ypT1a/N0 disease (0/3 nodes). She completed treatment in 08/2020.  -she has no evidence of recurrence. Will continue to monitor.  -recommendations per NCCN for H/P every 3-6 months for  2y, then q106mo for a total of 5y. CEA at the same interval. Repeat labs today and in 3 months. At her next visit will extend follow up interval to every 6 months.  -CT C/A/P every 6-12 months for  a total of 5y. CT C/A/P in 06/2022 was normal. Will plan annual CT from here on out.  -colonoscopy 06/2022 normal, follow up in 06/2027 with Dr. Alcide Evener      2) SBO:  -prior SBOs due to adhesions from prior surgery. Plan is to consider LOA only if she has a recurrence of SBO.      3) Supportive care:  -Life with cancer  -Nutrition  -pelvic PT for incontinence and pelvic symptoms   -survivorship clinic        I spent a total of 30 minutes reviewing the chart, discussing the case with consultants, and discussing the plan of care with the patient on the date of service.     1. Rectal cancer  CBC and differential    RRL - CMP    CEA    CEA    RRL - CMP    CBC and differential          Orders Placed This Encounter   Procedures    CBC and differential     Standing Status:   Future     Number of Occurrences:   1     Standing Expiration Date:   10/19/2022     Order Specific Question:   Release to patient     Answer:   Immediate    RRL - CMP     Standing Status:   Future     Number of Occurrences:   1     Standing Expiration Date:   10/19/2022     Order Specific Question:   Release to patient     Answer:   Immediate    CEA     Standing Status:   Future     Number of Occurrences:   1     Standing Expiration Date:   06/18/2023     Order Specific Question:   Release to patient     Answer:   Immediate     There are no Patient Instructions on file for this visit.    Leonides Schanz, MD

## 2022-06-23 ENCOUNTER — Ambulatory Visit: Payer: Medicare Other | Admitting: Acupuncturist

## 2022-06-24 ENCOUNTER — Encounter (INDEPENDENT_AMBULATORY_CARE_PROVIDER_SITE_OTHER): Payer: Self-pay | Admitting: Acupuncturist

## 2022-06-24 ENCOUNTER — Ambulatory Visit (INDEPENDENT_AMBULATORY_CARE_PROVIDER_SITE_OTHER): Payer: Medicare Other | Admitting: Acupuncturist

## 2022-06-24 DIAGNOSIS — C20 Malignant neoplasm of rectum: Secondary | ICD-10-CM

## 2022-06-24 NOTE — Assessment & Plan Note (Signed)
Acupuncture Treatment #1 (06/24/22)    Chief Complaints:    1) Peripheral neuropathy - tingling/numbness in fingertips (reduced FMS)  2) Bowel dysregulation - constipation/loose stools  3) Insomnia - intermittent/bowel related (twice/week)  4) Bone/joint pain - hands (arthritis?)      Acupuncture: ST 36, SP 6, KI 3, LR 3, LI 10, TE 6, [place]      Marygrace Drought, [place]  [place] Lic # [id number]

## 2022-06-28 ENCOUNTER — Ambulatory Visit (INDEPENDENT_AMBULATORY_CARE_PROVIDER_SITE_OTHER): Payer: Medicare Other | Admitting: Acupuncturist

## 2022-06-28 ENCOUNTER — Encounter (INDEPENDENT_AMBULATORY_CARE_PROVIDER_SITE_OTHER): Payer: Self-pay | Admitting: Acupuncturist

## 2022-06-28 DIAGNOSIS — C20 Malignant neoplasm of rectum: Secondary | ICD-10-CM

## 2022-06-28 NOTE — Assessment & Plan Note (Signed)
Acupuncture Treatment #2 (06/28/22)    Progress Notes:     - Neuropathy in fingertips less acute   - Bowels more regulated   - Insomnia less acute (staying asleep)   - Bone pain in hands less acute      Acupuncture: LU 5, TE 6, [place], KI 3, ST 36, SP 6      Marygrace Drought, [place]  [place] Lic # [id number]

## 2022-07-07 NOTE — Progress Notes (Signed)
Aimee Watson is a 67 y.o. female who presents today for a Medicare Annual Wellness Visit.     Health Risk Assessment     During the past month, how would you rate your general health?:  (P) Fair  Which of the following tasks can you do without assistance - drive or take the bus alone; shop for groceries or clothes; prepare your own meals; do your own housework/laundry; handle your own finances/pay bills; eat, bathe or get around your home?:  (P) Drive or take the bus alone, Shop for groceries or clothes, Prepare your own meals, Do your own housework/laundry, Handle your own finances/pay bills, Eat, bathe, dress or get around your home  Which of the following problems have you been bothered by in the past month - dizzy when standing up; problems using the phone; feeling tired or fatigued; moderate or severe body pain?: (P) None of these  Do you exercise for about 20 minutes 3 or more days per week?:  (P) Yes  During the past month was someone available to help if you needed and wanted help?  For example, if you felt nervous, lonely, got sick and had to stay in bed, needed someone to talk to, needed help with daily chores or needed help just taking care of yourself.: (P) Yes  Do you always wear a seat belt?: (P) Yes  Do you have any trouble taking medications the way you have been told to take them?: (P) No  Have you been given any information that can help you with keeping track of your medications?: (P) Yes  Do you have trouble paying for your medications?: (P) No  Have you been given any information that can help you with hazards in your house, such as scatter rugs, furniture, etc?: (P) No  Do you feel unsteady when standing or walking?: (P) No  Do you worry about falling?: (P) No  Have you fallen two or more times in the past year?: (P) No  Did you suffer any injuries from your falls in the past year?: (P) No    Additional Concerns     67 year old Female with with history of rectal cancer, GERD with hiatal  hernia, allergies comes in today for Medicare Annual Wellness Visit.          BP: 125-133/70-75  Helps take care of 22 months old granddaughter   Denies any headache, shortness of breath, orthopnea, PND, blurry vision, chest pain, palpitation, hematuria         Allergies:  -Zyrtec over the counter.      GERD with hiatal hernia.    -Omeprazole 20 mg daily.      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   -Pain in multiple MCPs both sides, also in knuckles. Describes as  burning pain. -Finger tips numbness and tingling. Also numbness and tingling as well. Stiff hands. Morning and evening stiffness lasting more than an hour. Using exercise ball and using Voltaren gel. Unable to close the fingers due to stiffness.   -Started after the chemo.   -Hand X-rays with minimal DJD and borderline osteopenia   -ANA +ve 1:320,  ESR elevated. Waiting to see rheumatologist. Has to make apt. Referral provided again today.   -Also doing Acupuncture.            Rectal Cancer :  -01/18/20 had surgery.   -Ct scans every 6 month.  -Colonoscopy scheduled.   -Followed by Oncologist every 3 months.   -Followed by GI   -  Also seeing Survivorship.      He anxiety is getting better. Living with Son but has separate apartment for herself.        Sometimes has bad days due to unusual BM. Stomach gurgling, palpitation      Mammogram scheduled  Bone Density scheduled.  Last Colonoscopy on 06/17/2022 normal, Recommended to repeat in 5 years.     Flu shots up to date per patient.   Pneumonia up to date per patient.   Tetanus shot not sure  COVID Vaccine primary series and 1 booster. Recommend booster from pharmacy.   Shingles up to date per patient.   Recommend RSV vaccine from pharmacy        Patient Care Team:  Sandria Senter, MD as PCP - General (Internal Medicine)  Leonides Schanz, MD as Oncologist (Medical Oncology)    Past Medical History:   Diagnosis Date    Rectal adenocarcinoma     Small bowel obstruction      Past Surgical History:   Procedure  Laterality Date    COLONOSCOPY, SCREENING N/A 06/17/2022    Procedure: COLONOSCOPY, SCREENING;  Surgeon: Guadalupe Dawn, MD;  Location: [facility];  Service: Gastroenterology;  Laterality: N/A;    ROBOTIC, COLECTOMY, LAR  10/21/2020    TAKEDOWN ILEOSTOMY  03/24/2021     Allergies   Allergen Reactions    Penicillins Rash      Outpatient Medications Marked as Taking for the 07/08/22 encounter (Office Visit) with Sandria Senter, MD   Medication Sig Dispense Refill    ascorbic acid (VITAMIN C) 250 MG tablet Take 1 tablet (250 mg) by mouth      Cholecalciferol (Vitamin D3) 2000 UNIT capsule Take 1 capsule (2,000 Units) by mouth      L-Lysine HCl 500 MG Tab Take by mouth every evening      melatonin 3 mg tablet Take 1 tablet (3 mg) by mouth nightly      Multiple Vitamins-Minerals (Multi Vitamin/Minerals) Tab Take 1 tablet by mouth      omeprazole (PriLOSEC) 20 MG capsule Take 1 capsule (20 mg) by mouth      vitamin B-12 (CYANOCOBALAMIN) 1000 MCG tablet Take 1 tablet (1,000 mcg) by mouth daily Gummies      Wheat Dextrin (BENEFIBER PO) Take by mouth      zinc gluconate 50 MG tablet Take 1 tablet (50 mg) by mouth        Social History     Tobacco Use    Smoking status: Never    Smokeless tobacco: Never   Vaping Use    Vaping status: Never Used   Substance Use Topics    Alcohol use: Yes     Comment: 1-2 a month of wine    Drug use: Never      Family History   Problem Relation Age of Onset    Rheum arthritis Mother          The following sections were reviewed this encounter by the provider:        Hospitalizations  hospitalization within past year - discharge diagnosis [facility]    Depression Screening    See related Activity or Flowsheet    Functional Ability    Falls Risk:  home does not have throw rugs, poor lighting or a slippery bath tub or shower  Hearing:  hearing within normal limits  Exercise:  Light ( i.e. stretching or slow walking )  ADL's:   Bathing - independent  Dressing - independent   Mobility -  independent   Transfer - independent   Eating - independent}   Toileting - independent   ADL assistance not needed    Discussion of Advance Directives: Not addressed today.      ROS:   Review of Systems   General/Constitutional:   Denies Change in appetite. Denies Chills. Denies Fatigue. Denies Fever. Denies Weight gain. Denies Weight loss.   Ophthalmologic:   Denies Blurred vision.  ENT:   Denies Hearing Loss. Denies Ear pain. Denies Sore throat. Denies Sneezing.   Endocrine:   Denies Polydipsia. Denies Polyuria.   Cardiovascular:   Denies Chest pain. Denies Palpitations. Denies Swelling in feet.   Respiratory:   Denies Cough. Denies Shortness of breath.  Denies Snoring.Denies Wheezing.   Gastrointestinal:   Denies Abdominal pain. Denies Blood in stool. Denies Constipation. Denies Diarrhea. Denies Nausea. Denies Vomiting.   Hematology:   Denies Easy bruising. Denies Easy Bleeding.   Genitourinary:   Denies Blood in urine. Denies Nocturia. Vaginal discharge. Vaginal bleeding.  Musculoskeletal:   Denies Joint pain. Denies Swollen joints.   Skin:   Denies Itching. Denies Change in Mole(s). Denies Rash.   Neurologic:   Denies Dizziness. Denies Headache. Denies Pre-Syncope. Denies weakness and decreased sensation  Psychiatric:   Denies Difficulty sleeping, denies mood changes.      Objective:   Physical Exam   Examination:   General Examination:   GENERAL APPEARANCE: alert, in no acute distress, well developed, well nourished, oriented to time, place, and person.   HEAD: normal appearance, atraumatic.   EYES: extraocular movement intact (EOMI), pupils equal, round, reactive to light and accommodation, sclera anicteric, conjunctiva clear.   EARS: tympanic membranes normal bilaterally, external canals normal .   NOSE: normal nasal mucosa, no lesions.   ORAL CAVITY: normal oropharynx, normal lips, mucosa moist, no lesions.   THROAT: normal appearance, clear, no erythema.   NECK/THYROID: neck supple, no carotid bruit,  carotid pulse 2+ bilaterally, no cervical lymphadenopathy, no neck mass palpated, no jugular venous distention, no thyromegaly.   Breast exam: no palpable lump  SKIN: good turgor, no rashes, no suspicious lesions.   HEART: S1, S2 normal, no murmurs, rubs, gallops, regular rate and rhythm.   LUNGS: normal effort / no distress, normal breath sounds, clear to auscultation bilaterally, no wheezes, rales, rhonchi.   ABDOMEN: bowel sounds present, no hepatosplenomegaly, soft, nontender, nondistended.   MUSCULOSKELETAL: full range of motion, no swelling or deformity.   EXTREMITIES: no edema, no clubbing, cyanosis, or edema.   PERIPHERAL PULSES: 2+ dorsalis pedis, 2+ posterior tibial.   NEUROLOGIC: nonfocal, cranial nerves 2-12 grossly intact, deep tendon reflexes 2+ symmetrical, normal strength, tone and reflexes, sensory exam intact.   PSYCH: cognitive function intact, mood/affect full range, speech clear.        Assessment  Immunization History   Administered Date(s) Administered    COVID-19 mRNA MONOVALENT vaccine PRIMARY SERIES 12 years and above Proofreader) 30 mcg/0.3 mL (DO NOT DILUTE) 05/16/2019, 06/13/2019, 02/04/2020    INFLUENZA HIGH DOSE 65 YRS+ 11/26/2019, 01/28/2021      BP 136/76 (BP Site: Right arm, Patient Position: Sitting, Cuff Size: Medium)   Pulse 76   Temp 97.8 F (36.6 C) (Oral)   Resp 20   Ht 1.6 m (5\' 3" )   Wt 59.9 kg (132 lb)   SpO2 100%   BMI 23.38 kg/m    BP Readings from Last 3 Encounters:   07/08/22 136/76   06/18/22 130/78  06/17/22 145/71      Wt Readings from Last 3 Encounters:   07/08/22 59.9 kg (132 lb)   06/18/22 60.8 kg (134 lb)   06/17/22 59.5 kg (131 lb 2.8 oz)      LABS:   Lab Results   Component Value Date    WBC 5.82 06/18/2022    HGB 12.0 06/18/2022    HCT 34.9 06/18/2022    PLT 229 06/18/2022    NA 140 06/18/2022    K 4.1 06/18/2022    MG 2.2 12/06/2021    BUN 14.0 06/18/2022    CREAT 0.8 06/18/2022    GLU 100 06/18/2022    AST <25 06/18/2022    ALT <20 06/18/2022    TSH  1.23 10/18/2021             Evaluation of Cognitive Function    Mood/affect: Appropriate  Appearance: neatly groomed, appropriately and adequately nourished  Family member/caregiver input: Not present    Slums: 30/30    Assessment/Plan    1. Medicare annual wellness visit, subsequent  - TSH  - Lipid panel  - Hemoglobin A1C    2. Elevated blood sugar  - TSH  - Lipid panel  - Hemoglobin A1C    3. Arthritis  - Referral to Rheumatology ([place])    4. ANA positive  - Referral to Rheumatology ([place])    5. Hyperlipidemia, unspecified hyperlipidemia type  - TSH  - Lipid panel  - Hemoglobin A1C       Sandria Senter, MD   07/08/2022

## 2022-07-08 ENCOUNTER — Encounter (INDEPENDENT_AMBULATORY_CARE_PROVIDER_SITE_OTHER): Payer: Self-pay | Admitting: Internal Medicine

## 2022-07-08 ENCOUNTER — Encounter (INDEPENDENT_AMBULATORY_CARE_PROVIDER_SITE_OTHER): Payer: Self-pay

## 2022-07-08 ENCOUNTER — Ambulatory Visit (INDEPENDENT_AMBULATORY_CARE_PROVIDER_SITE_OTHER): Payer: Medicare Other | Admitting: Internal Medicine

## 2022-07-08 VITALS — BP 136/76 | HR 76 | Temp 97.8°F | Resp 20 | Ht 63.0 in | Wt 132.0 lb

## 2022-07-08 DIAGNOSIS — R768 Other specified abnormal immunological findings in serum: Secondary | ICD-10-CM

## 2022-07-08 DIAGNOSIS — M1990 Unspecified osteoarthritis, unspecified site: Secondary | ICD-10-CM

## 2022-07-08 DIAGNOSIS — Z0000 Encounter for general adult medical examination without abnormal findings: Secondary | ICD-10-CM

## 2022-07-08 DIAGNOSIS — E785 Hyperlipidemia, unspecified: Secondary | ICD-10-CM

## 2022-07-08 DIAGNOSIS — R739 Hyperglycemia, unspecified: Secondary | ICD-10-CM

## 2022-07-08 LAB — LIPID PANEL
Cholesterol / HDL Ratio: 2.6 Index
Cholesterol: 211 mg/dL — ABNORMAL HIGH (ref 0–199)
HDL: 81 mg/dL (ref 40–9999)
LDL Calculated: 108 mg/dL — ABNORMAL HIGH (ref 0–99)
Triglycerides: 109 mg/dL (ref 34–149)
VLDL Calculated: 22 mg/dL (ref 10–40)

## 2022-07-08 LAB — TSH: TSH: 1.46 u[IU]/mL (ref 0.35–4.94)

## 2022-07-08 LAB — HEMOGLOBIN A1C
Average Estimated Glucose: 102.5 mg/dL
Hemoglobin A1C: 5.2 % (ref 4.6–5.6)

## 2022-07-08 NOTE — Progress Notes (Signed)
Have you seen any specialists since your last visit with [place]?  Yes      The patient was informed that the following HM items are still outstanding:   mammogram, shingles vaccine, and pneumonia vaccine, advance direcitive, tetanus, covid vaccine, hep c screening,

## 2022-07-09 NOTE — Progress Notes (Unsigned)
Survivorship Progress Note    Patient Name: Aimee Watson   Date of Birth: February 28, 1955     Date of Service: 07/13/2022    Diagnosis: Rectal cancer      Oncology History   Rectal cancer   11/12/2021 Initial Diagnosis    Rectal cancer     12/10/2021 Cancer Staged    Staging form: Colon and Rectum, AJCC 8th Edition  - Clinical: Stage IIIB (cT3, cN1, cM0) - Signed by Leonides Schanz, MD on 12/10/2021         We reviewed oncology history and treatment to date.    HPI  This is a lovely woman with a history of rectal cancer and a past medical history including:    Past Medical History:   Diagnosis Date    Rectal adenocarcinoma     Small bowel obstruction          Survivorship focused concerns  Aimee Watson wished to discuss the following survivorship focused issues with me today.  Question 07/12/2022  7:47 PM EDT - Ceasar Mons by Patient   Over the last 14 days, how often have you been bothered by the following problems?    Feeling nervous, anxious, or on edge Not at all   Not able to stop or control worry Not at all   Little interest or pleasure in doing things Not at all   Feeling down, depressed, or hopeless Not at all   Fear of developing another cancer or a recurrence Several days   Please indicate if you developed any of the below concerns as a result of your cancer treatment by selecting Yes or No for each question.    Paying for food and/or housing    Paying for medication or medical care    Insurance Coverage or no health insurance    Please indicate if you developed any of the below concerns as a result of your cancer treatment by selecting Yes or No for each question.    Concerns about my children No   Concerns about my partner No   Issues with work or school No   Please indicate if you developed any of the below concerns as a result of your cancer treatment by selecting Yes or No for each question.    Spiritual, faith, or religious concerns No   Please indicate if you developed any of the below concerns as a result of your  cancer treatment by selecting Yes or No for each question.    Concerns about body weight No   Concerns about diet (food) and cancer risk/incidence Yes   Concerns about alternative/herbal supplements Yes   Please indicate if you developed any of the below concerns as a result of your cancer treatment by selecting Yes or No for each question.    Lack of understanding about my cancer diagnosis or stage No   Have questions about potential long term complications from my treatment Yes   Developed other illnesses as a result of my cancer treatment Yes   Issues with transportation to/from appointments No   Need help coordinating my care No   Need cancer screening No   Please indicate if you developed any of the below concerns as a result of your cancer treatment by selecting Yes or No for each question.    Ability to have children No   Appearance No   Breathing No   Constipation or Diarrhea Yes   Hot flashes and/or vaginal dryness No   Nausea or  Vomiting No   Difficulty with chewing or swallowing due to cancer therapy No   Pain Yes   Sexual itimacy or function No   Dry skin Yes   Sleep No   Decreased range of motion or loss of strength No   Lower energy level No   Swollen arm/legs No   Tingling in my hands/feet Yes   Trouble remembering, concentrating No   Please indicate which factors may be relevant to your lifestyle by checking Yes or No for each.    I use tanning beds No   I am often outside, in the sun No   I use tobacco No   I use prescription pain medication for reasons other than pain control No   I exercise regularly Yes   I drink alcohol Yes   I use recreational drugs No   What are your top three (3) most important survivorship concerns? reoccurrence of cancer    long term side effects from treatment    Struggle with knowing I have to accept my new normal       Physical Exam:  General: This is a very pleasant, well-appearing 67 year old woman who appears to be in no acute distress. ECOG performance status is  0.  Vitals:    07/13/22 1137   BP: 144/74   Pulse: 71   Resp: 16   Temp: 97.8 F (36.6 C)   SpO2: 100%       Psych: Appropriate mood and affect with intact insight and judgement      TARGETED RECOMMENDATIONS AND INTERVENTIONS    Cancer fighting lifestyle/ Recurrence risk reduction    Recommend a minimum target of 150 min of moderate or 75 minutes of vigorous exercise per week   (ii)Maintain BMI less than 25 (currently 23.8) by exercising, and eating healthy diet with appropriate portion sizes   (iii)Alcohol is a carcinogen and increases cancer risk in pre and post-menopausal women. Limit alcohol intake to 2 or fewer servings per week, if at all.    (iv)Vitamin D supplementation to maintain therapeutic range as vitamin D deficiency is linked multiple cancers    Emotional distress: Aimee Watson shares her struggle to accept her "new normal" after cancer treatment.  She continues to experience angry feelings when she thinks about it.  She had mostly kept her diagnosis to herself.  It has been two years and she still struggles with loose stools but this is better with strict compliance of dietary recommendations. However, she doesn't want to live on such a restricted diet.  She allows herself to enjoy eating out once a week and when she experiences looser stools she accepts and manages that.  She has not yet scheduled pelvic floor PT as discussed at our last visit.  She didn't feel like she could manage that along with acupuncture and rheum work up.      She does note feeling a weight has lifted after her most recent visit with Dr. Nedra Hai who reassured it that it was quite a good sign that her cancer hadn't recurred by the 2 year mark.  This has helped her feel like she might make it through this. At the same     - I provided normalization and validation of her experience.  - We talked about bullet point journaling around stressors and noting on one side of the page what she doesn't control and adding things to the other  side that she can control.  We discussed that it  is helpful to see when you are expending too much mental energy focused on things outside of one's control and losing energy to put into what you do want to do and can control.  - I referred her to Aimee Newcomer LCSW with [facility] for counseling on fear of recurrence and made the appointment with her.     Lower anterior resection syndrome.  She is angry that she still has to restrict her diet 2 years post surgery to avoid loose stools.  She has been more liberal on what foods she eats because she just cannot tolerate being so limited.  She has figured out what is going to result in GI distress/ loose stools and makes plans to be at home to privately deal with them.  At our last visit, I referred her for pelvic floor PT.  She has not had the time to go yet.    - We re-reviewed dietary recommendations.  - I re-referred for pelvic floor PT  - We discussed that if dietary changes and pelvic floor PT are insufficient, we could discuss sacral nerve stimulation.    Emotional Distress: She remains angry about her cancer and LARS syndrome. She feels it has dimmed her light and made her be less like her usual self.     - I normalized and validated her experience.  - We discussed the concept of radical acceptance two years post diagnosis and surgery and what might happen if she accepts that this has happened.  - We discussed that this gives her some things that she can work on that will help her take action on what she can control.  - We discussed that accepting what has happened doesn't mean we approve of it, merely that it exists and we can choose to work with diet and pelvic floor therapy to help gain more control.  - We reviewed that radical acceptance can be the next step in moving forward with the emotional impact of cancer.  - We discussed that Dr. Nedra Hai telling her it was very good news that the cancer hadn't recurred in 2 years as been a huge boost to her.  - I  referred her to Aimee Newcomer LCSW for fear of cancer recurrence and the emotional impact of cancer.    Health Maintenance/Screening   Yearly Physical Exam: You should continue to follow up with your primary care provider at least once a year for a complete physical exam, including cholesterol, diabetes and thyroid function monitoring.  Mammogram: Annually, now overdue        Follow Up/Referrals       I will see the patient back in 8-12 weeks.  I have referred this patient to [facility] for counseling.  I have re-referred to pelvic floor PT.    Darral Dash, PA    My total visit time for this patient encounter was 75 min. This includes time spent preparing to see the patient, counseling patient, care coordination, and documenting clinical information in the record.    This visit started at 11:41 and ended at 12:55 and chart review/ documentation on 07/13/2022 took an additional 6 minutes outside of the exam room (beyond in and out time)  Aimee Watson DMSc, PA-C  Customer service manager [employer] - Survivorship Program

## 2022-07-10 NOTE — Progress Notes (Signed)
Please call the patient with following recommendation.    The following test results are abnormal: - LDL is above goal.  Using the [employer] of Cardiology/American Heart Association risk calculator/estimator, the 10 yr risk of heart disease or stroke is 7.9%%.  On the basis of your age and calculated risk for heart disease or stroke over 7.5%, the ACC/AHA guidelines suggest you should be on a moderate to high intensity statin.  Recommend starting Crestor ( rosuvastatin) 5 mg daily;  repeat CMP/lipid in approximately 6 weeks. Please let me know if you agree to get started on this medication. If you agree to get started on this medication then I can send the prescription. If you have more questions about this then please schedule a video apt to discuss more about this.     The following lab results are within acceptable limits: - A1C ( 3 month measure of glucose control ) is within normal limits.  - TSH ( a screen for thyroid disease) is within normal limits.

## 2022-07-12 ENCOUNTER — Encounter (INDEPENDENT_AMBULATORY_CARE_PROVIDER_SITE_OTHER): Payer: Self-pay | Admitting: Acupuncturist

## 2022-07-12 ENCOUNTER — Ambulatory Visit (INDEPENDENT_AMBULATORY_CARE_PROVIDER_SITE_OTHER): Payer: Medicare Other | Admitting: Acupuncturist

## 2022-07-12 ENCOUNTER — Encounter (INDEPENDENT_AMBULATORY_CARE_PROVIDER_SITE_OTHER): Payer: Self-pay | Admitting: Internal Medicine

## 2022-07-12 DIAGNOSIS — C20 Malignant neoplasm of rectum: Secondary | ICD-10-CM

## 2022-07-12 NOTE — Assessment & Plan Note (Signed)
Acupuncture Treatment #3 (07/12/22)    Progress Notes:     - Neuropathy in fingertips less acute   - Bowel dysregulation persists   - Insomnia less acute (staying asleep)   - Bone pain in hands less acute      Acupuncture: LU 5, TE 6, [place], KI 3, ST 36, SP 6      Marygrace Drought, [place]  [place] Lic # [id number]

## 2022-07-13 ENCOUNTER — Ambulatory Visit: Payer: Medicare Other | Attending: Physician Assistant | Admitting: Physician Assistant

## 2022-07-13 ENCOUNTER — Encounter: Payer: Self-pay | Admitting: Physician Assistant

## 2022-07-13 VITALS — BP 144/74 | HR 71 | Temp 97.8°F | Resp 16 | Ht 63.0 in | Wt 135.4 lb

## 2022-07-13 DIAGNOSIS — Z719 Counseling, unspecified: Secondary | ICD-10-CM | POA: Insufficient documentation

## 2022-07-13 DIAGNOSIS — C20 Malignant neoplasm of rectum: Secondary | ICD-10-CM | POA: Insufficient documentation

## 2022-07-13 NOTE — Progress Notes (Signed)
Nurse lvm with the stated message and informed pt to call the office for questions and to schedule an appt.   [phone]      The following test results are abnormal: - LDL is above goal.  Using the [employer] of Cardiology/American Heart Association risk calculator/estimator, the 10 yr risk of heart disease or stroke is 7.9%%.  On the basis of your age and calculated risk for heart disease or stroke over 7.5%, the ACC/AHA guidelines suggest you should be on a moderate to high intensity statin.  Recommend starting Crestor ( rosuvastatin) 5 mg daily;  repeat CMP/lipid in approximately 6 weeks. Please let me know if you agree to get started on this medication. If you agree to get started on this medication then I can send the prescription. If you have more questions about this then please schedule a video apt to discuss more about this.     The following lab results are within acceptable limits: - A1C ( 3 month measure of glucose control ) is within normal limits.   - TSH ( a screen for thyroid disease) is within normal limit

## 2022-07-14 ENCOUNTER — Encounter (INDEPENDENT_AMBULATORY_CARE_PROVIDER_SITE_OTHER): Payer: Self-pay | Admitting: Internal Medicine

## 2022-08-03 ENCOUNTER — Encounter (INDEPENDENT_AMBULATORY_CARE_PROVIDER_SITE_OTHER): Payer: Self-pay

## 2022-08-03 ENCOUNTER — Telehealth (INDEPENDENT_AMBULATORY_CARE_PROVIDER_SITE_OTHER): Payer: Self-pay | Admitting: Internal Medicine

## 2022-08-03 ENCOUNTER — Other Ambulatory Visit (INDEPENDENT_AMBULATORY_CARE_PROVIDER_SITE_OTHER): Payer: Self-pay | Admitting: Internal Medicine

## 2022-08-03 ENCOUNTER — Ambulatory Visit (INDEPENDENT_AMBULATORY_CARE_PROVIDER_SITE_OTHER): Payer: Medicare Other | Admitting: Acupuncturist

## 2022-08-03 DIAGNOSIS — Z1231 Encounter for screening mammogram for malignant neoplasm of breast: Secondary | ICD-10-CM

## 2022-08-03 NOTE — Telephone Encounter (Signed)
Please add mammogram order to patients account. Thank you

## 2022-08-04 ENCOUNTER — Encounter (INDEPENDENT_AMBULATORY_CARE_PROVIDER_SITE_OTHER): Payer: Self-pay | Admitting: Internal Medicine

## 2022-08-05 ENCOUNTER — Ambulatory Visit (INDEPENDENT_AMBULATORY_CARE_PROVIDER_SITE_OTHER): Payer: Medicare Other | Admitting: Acupuncturist

## 2022-08-05 ENCOUNTER — Encounter (INDEPENDENT_AMBULATORY_CARE_PROVIDER_SITE_OTHER): Payer: Self-pay | Admitting: Acupuncturist

## 2022-08-05 DIAGNOSIS — C20 Malignant neoplasm of rectum: Secondary | ICD-10-CM

## 2022-08-05 NOTE — Assessment & Plan Note (Signed)
Acupuncture Treatment #4 (08/05/22)    Progress Notes:     - Neuropathy in fingertips less acute   - Bowel dysregulation persists   - Insomnia less acute (staying asleep)   - Bone pain in hands less acute      Acupuncture: LU 7, TE 6, KI 6, ST 36, SP 6      Marygrace Drought, [place]  [place] Lic # [id number]

## 2022-08-06 ENCOUNTER — Encounter (INDEPENDENT_AMBULATORY_CARE_PROVIDER_SITE_OTHER): Payer: Self-pay | Admitting: Internal Medicine

## 2022-08-06 ENCOUNTER — Ambulatory Visit: Payer: Self-pay

## 2022-08-10 ENCOUNTER — Encounter (INDEPENDENT_AMBULATORY_CARE_PROVIDER_SITE_OTHER): Payer: Self-pay | Admitting: Acupuncturist

## 2022-08-10 ENCOUNTER — Ambulatory Visit (INDEPENDENT_AMBULATORY_CARE_PROVIDER_SITE_OTHER): Payer: Medicare Other | Admitting: Acupuncturist

## 2022-08-10 DIAGNOSIS — C20 Malignant neoplasm of rectum: Secondary | ICD-10-CM

## 2022-08-10 NOTE — Assessment & Plan Note (Signed)
Acupuncture Treatment #5 (08/10/22)    Progress Notes:     - Neuropathy in fingertips more acute (numbness)   - Bowel dysregulation persists   - Insomnia less acute (staying asleep)   - Bone pain in hands more acute      Acupuncture: LU 7, TE 6, KI 6, ST 36, SP 6      Marygrace Drought, [place]  [place] Lic # [id number]

## 2022-08-19 ENCOUNTER — Ambulatory Visit (INDEPENDENT_AMBULATORY_CARE_PROVIDER_SITE_OTHER): Payer: Medicare Other | Admitting: Acupuncturist

## 2022-08-25 NOTE — Dental Procedure Details (Signed)
CROWN/BRIDGE/VENEER DELIVERY    Crown(s) permanently cemented with NX3 Universal; etched with HF acid 30 seconds and monobond inside.    X-ray taken.  Post-seat x-ray taken to verify complete seating of restoration and complete cement removal     Anesthetic: Septocaine (Articaine hydrochloride 4% w/ epi 1:100K) x Carpules: .5 carpule;    Patient reported no trouble with the temporary    LiSi shade A3

## 2022-09-03 ENCOUNTER — Encounter (INDEPENDENT_AMBULATORY_CARE_PROVIDER_SITE_OTHER): Payer: Self-pay | Admitting: Internal Medicine

## 2022-09-04 IMAGING — US IR IMAGING GUIDED PORT INSERTION
1 series · 1 of 1 positions shown · non-contrast
Comparison: None.

INDICATION: 64-year-old with rectal cancer. Port-A-Cath needed for chemotherapy.

EXAM:
FLUOROSCOPIC AND ULTRASOUND GUIDED PLACEMENT OF A SUBCUTANEOUS PORT

[Series 1: (id) · 1 of 1 slices shown]
[im 1/1]
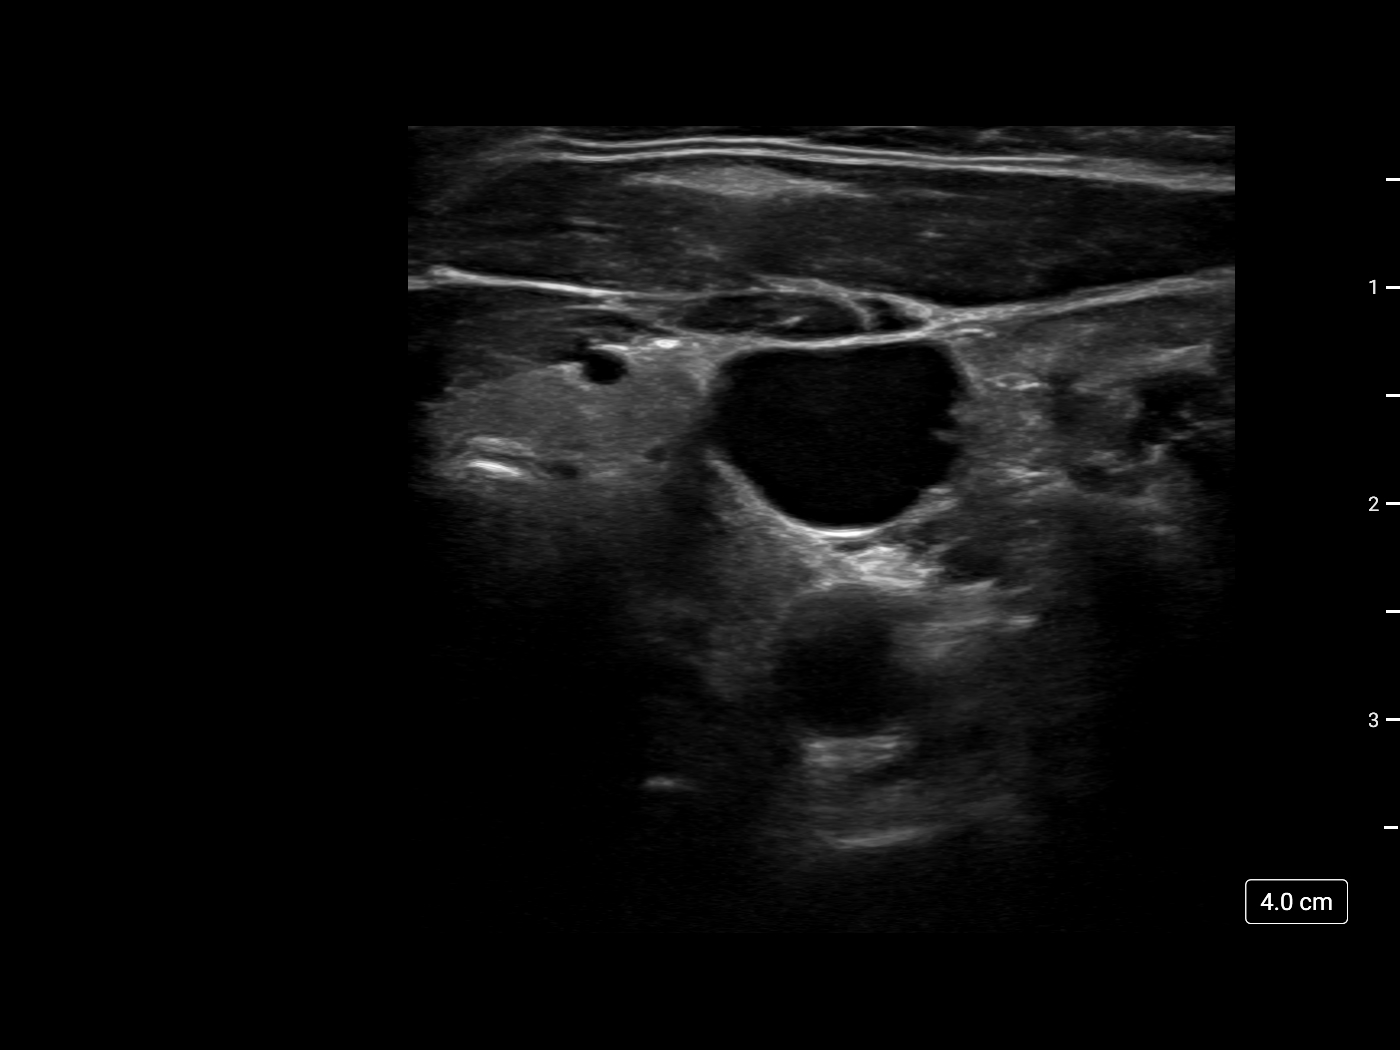

[1 of 1 positions shown; findings below may reference images not displayed]

MEDICATIONS:
Cleocin 900 mg; The antibiotic was administered within an
appropriate time interval prior to skin puncture.

ANESTHESIA/SEDATION:
Versed 3.0 mg IV; Fentanyl 100 mcg IV;

Moderate Sedation Time:  29 minutes

The patient was continuously monitored during the procedure by the
interventional radiology nurse under my direct supervision.

FLUOROSCOPY TIME:  18 seconds, 2 mGy

COMPLICATIONS:
None immediate.

PROCEDURE:
The procedure, risks, benefits, and alternatives were explained to
the patient. Questions regarding the procedure were encouraged and
answered. The patient understands and consents to the procedure.

Patient was placed supine on the interventional table. Ultrasound
confirmed a patent right internal jugular vein. Ultrasound image was
saved for documentation. The right chest and neck were cleaned with
a skin antiseptic and a sterile drape was placed. Maximal barrier
sterile technique was utilized including caps, mask, sterile gowns,
sterile gloves, sterile drape, hand hygiene and skin antiseptic. The
right neck was anesthetized with 1% lidocaine. Small incision was
made in the right neck with a blade. Micropuncture set was placed in
the right internal jugular vein with ultrasound guidance. The
micropuncture wire was used for measurement purposes. The right
chest was anesthetized with 1% lidocaine with epinephrine. #15 blade
was used to make an incision and a subcutaneous port pocket was
formed. 8 french Power Port was assembled. Subcutaneous tunnel was
formed with a stiff tunneling device. The port catheter was brought
through the subcutaneous tunnel. The port was placed in the
subcutaneous pocket and sutured in place using Ethilon suture. The
micropuncture set was exchanged for a peel-away sheath. The catheter
was placed through the peel-away sheath and the tip was positioned
at the superior cavoatrial junction. Catheter placement was
confirmed with fluoroscopy. The port was accessed and flushed with
heparinized saline. The port pocket was closed using two layers of
absorbable sutures and Dermabond. The vein skin site was closed
using a single layer of absorbable suture and Dermabond. Sterile
dressings were applied. Patient tolerated the procedure well without
an immediate complication. Ultrasound and fluoroscopic images were
taken and saved for this procedure.
IMPRESSION: Placement of a subcutaneous port device. Catheter tip at the
superior cavoatrial junction.

## 2022-09-17 ENCOUNTER — Ambulatory Visit: Payer: Medicare Other

## 2022-09-21 ENCOUNTER — Ambulatory Visit: Payer: Medicare Other | Admitting: Hematology

## 2022-10-07 ENCOUNTER — Ambulatory Visit: Payer: Medicare Other | Attending: Internal Medicine

## 2022-10-07 DIAGNOSIS — C20 Malignant neoplasm of rectum: Secondary | ICD-10-CM | POA: Insufficient documentation

## 2022-10-07 LAB — LAB USE ONLY - CBC WITH DIFFERENTIAL
Absolute Basophils: 0.06 10*3/uL (ref 0.00–0.08)
Absolute Eosinophils: 0.09 10*3/uL (ref 0.00–0.44)
Absolute Immature Granulocytes: 0.01 10*3/uL (ref 0.00–0.07)
Absolute Lymphocytes: 0.99 10*3/uL (ref 0.42–3.22)
Absolute Monocytes: 0.49 10*3/uL (ref 0.21–0.85)
Absolute Neutrophils: 4.28 10*3/uL (ref 1.10–6.33)
Absolute nRBC: 0 10*3/uL (ref <=0.00)
Basophils %: 1 %
Eosinophils %: 1.5 %
Hematocrit: 37.2 % (ref 34.7–43.7)
Hemoglobin: 12.5 g/dL (ref 11.4–14.8)
Immature Granulocytes %: 0.2 %
Lymphocytes %: 16.7 %
MCH: 30.7 pg (ref 25.1–33.5)
MCHC: 33.6 g/dL (ref 31.5–35.8)
MCV: 91.4 fL (ref 78.0–96.0)
MPV: 10.6 fL (ref 8.9–12.5)
Monocytes %: 8.3 %
Neutrophils %: 72.3 %
Platelet Count: 204 10*3/uL (ref 142–346)
Preliminary Absolute Neutrophil Count: 4.28 10*3/uL (ref 1.10–6.33)
RBC: 4.07 10*6/uL (ref 3.90–5.10)
RDW: 13 % (ref 11–15)
WBC: 5.92 10*3/uL (ref 3.10–9.50)
nRBC %: 0 /100{WBCs} (ref <=0.0)

## 2022-10-07 LAB — RRL - CMP
ALT Piccolo(R): <20 U/L (ref 10–47)
AST Piccolo(R): 25 U/L (ref 11–38)
Albumin Piccolo(R): 4 g/dL (ref 3.3–5.0)
Alkaline Phosphatase Piccolo(R): 68 U/L (ref 42–141)
Anion Gap Piccolo(R): <0
BUN Piccolo(R): 14 mg/dL (ref 7.0–19.0)
Bilirubin Total Piccolo(R): 0.9 mg/dL (ref 0.2–1.6)
CO2 Piccolo(R): 31 meq/L — ABNORMAL HIGH (ref 21–29)
Calcium Piccolo(R): 9.7 mg/dL (ref 8.5–10.5)
Chloride Piccolo(R): 106 meq/L (ref 98–107)
Creatinine Piccolo(R): 1 mg/dL (ref 0.4–1.5)
GFR: >60 mg/dL (ref >=60.0)
Glucose Piccolo(R): 97 mg/dL (ref 70–100)
Potassium Piccolo(R): 4.2 meq/L (ref 3.5–5.1)
Protein Piccolo(R): 7.3 g/dL (ref 6.4–8.1)
Sodium Piccolo(R): 135 meq/L — ABNORMAL LOW (ref 136–145)

## 2022-10-07 LAB — CEA: CEA: <1.7 ng/mL (ref 0.0–5.0)

## 2022-10-07 NOTE — Addendum Note (Signed)
Addended byAlvester Morin, Tamas Suen on: 10/07/2022 08:55 AM     Modules accepted: Orders

## 2022-10-07 NOTE — Addendum Note (Signed)
Addended byAlvester Morin, Noe Pittsley on: 10/07/2022 08:54 AM     Modules accepted: Orders

## 2022-10-12 ENCOUNTER — Ambulatory Visit: Payer: Medicare Other | Attending: Hematology | Admitting: Hematology

## 2022-10-12 ENCOUNTER — Encounter: Payer: Self-pay | Admitting: Hematology

## 2022-10-12 VITALS — BP 143/85 | HR 77 | Temp 98.1°F | Resp 16 | Wt 141.0 lb

## 2022-10-12 DIAGNOSIS — C20 Malignant neoplasm of rectum: Secondary | ICD-10-CM | POA: Insufficient documentation

## 2022-10-12 NOTE — Progress Notes (Signed)
[facility]  [address]  [place], [place] [address]  P: [phone]  F: [phone]        [facility]  [address] [place], [address]  [place], [place] [address]  P: [phone]  F: [phone]    Gastroenterologist: Dr. Alcide Evener  Surgeon: Dr. Carole Civil    CHIEF COMPLAINT  Rectal cancer    HISTORY OF PRESENT ILLNESS  Lynee Bolognese is a 67 y.o. woman with a history of rectal cancer.     1) Rectal cancer:  -01/2020 colonoscopy for rectal bleeding revealed a mass 2cm from the anal verge. Biopsy confirmed adenocarcinoma. There was no e/o metastatic disease by CT. An MRI revealed a T2-T3 cancer with involvement of the internal sphincter and 2 positive mesorectal nodes.   -02/2020 neoadjuvant FOLFOX, followed by chemoRT 07/2020-08/2020.   -LAR with coloanal anastomosis and ypT1a/N0 disease (0/3 nodes), a near CR to therapy. She had MSI-L disease.  -03/2021 ostomy reversal   -postop course c/b bladder prolapse and urinary incontinence, frequent and loose bowel movements.    2) SBO:  -9/10-9/12/23 and 10/27-10/29/23 admitted to [facility] with SBO. The transition point was near the ileostomy reversal site. The SBO resolved with conservative management.        INTERIM HISTORY  Ms. Leavitt returns to the office for follow up.     It has been two years since she completed treatment for rectal cancer. She reports feeling well. She has chronic LAR syndrome symptoms but she reports her symptoms are stable right now. She has no new complaints. She has not had bowel obstruction since last year when she was admitted.     She had some acupuncture sessions which she thinks helped with neuropathy. She is also seeing rheumatology in October as they suspect she may have RA.    She had a colonoscopy on 06/17/22 which revealed normal mucosa and she was recommended for 5 year follow up with Dr. Sherryll Burger.    I reviewed notes from the PMD, survivorship clinic on the date of  service.    Allergies   Allergen Reactions    Penicillins Rash     Current Outpatient Medications   Medication Sig Dispense Refill    ascorbic acid (VITAMIN C) 250 MG tablet Take 1 tablet (250 mg) by mouth      Cholecalciferol (Vitamin D3) 2000 UNIT capsule Take 1 capsule (2,000 Units) by mouth      L-Lysine HCl 500 MG Tab Take by mouth every evening      melatonin 3 mg tablet Take 1 tablet (3 mg) by mouth nightly      Multiple Vitamins-Minerals (Multi Vitamin/Minerals) Tab Take 1 tablet by mouth      omeprazole (PriLOSEC) 20 MG capsule Take 1 capsule (20 mg) by mouth      vitamin B-12 (CYANOCOBALAMIN) 1000 MCG tablet Take 1 tablet (1,000 mcg) by mouth daily Gummies      Wheat Dextrin (BENEFIBER PO) Take by mouth      zinc gluconate 50 MG tablet Take 1 tablet (50 mg) by mouth       No current facility-administered medications for this visit.     Past Medical History:   Diagnosis Date    Rectal adenocarcinoma     Small bowel obstruction      Past Surgical History:   Procedure Laterality Date    COLONOSCOPY, SCREENING N/A 06/17/2022    Procedure: COLONOSCOPY, SCREENING;  Surgeon: Guadalupe Dawn, MD;  Location: [facility];  Service: Gastroenterology;  Laterality: N/A;    ROBOTIC, COLECTOMY, LAR  10/21/2020    TAKEDOWN ILEOSTOMY  03/24/2021     Family History   Problem Relation Age of Onset    Rheum arthritis Mother      Social History     Socioeconomic History    Marital status: Widowed   Tobacco Use    Smoking status: Never    Smokeless tobacco: Never   Vaping Use    Vaping status: Never Used   Substance and Sexual Activity    Alcohol use: Yes     Comment: 1-2 a month of wine    Drug use: Never    Sexual activity: Not Currently     Social Determinants of Health     Financial Resource Strain: Low Risk  (08/04/2022)    Overall Financial Resource Strain (CARDIA)     Difficulty of Paying Living Expenses: Not hard at all   Food Insecurity: No Food Insecurity (08/04/2022)    Hunger Vital Sign     Worried  About Running Out of Food in the Last Year: Never true     Ran Out of Food in the Last Year: Never true   Transportation Needs: No Transportation Needs (08/04/2022)    PRAPARE - Therapist, art (Medical): No     Lack of Transportation (Non-Medical): No   Physical Activity: Sufficiently Active (08/04/2022)    Exercise Vital Sign     Days of Exercise per Week: 5 days     Minutes of Exercise per Session: 60 min   Stress: No Stress Concern Present (08/04/2022)    [employer] of Occupational Health - Occupational Stress Questionnaire     Feeling of Stress : Not at all   Social Connections: Moderately Isolated (08/04/2022)    Social Connection and Isolation Panel [NHANES]     Frequency of Communication with Friends and Family: More than three times a week     Frequency of Social Gatherings with Friends and Family: More than three times a week     Attends Religious Services: More than 4 times per year     Active Member of [employer] or Organizations: No     Attends Banker Meetings: Never     Marital Status: Widowed   Intimate Partner Violence: Not At Risk (08/04/2022)    Humiliation, Afraid, Rape, and Kick questionnaire     Fear of Current or Ex-Partner: No     Emotionally Abused: No     Physically Abused: No     Sexually Abused: No   Housing Stability: Low Risk  (08/04/2022)    Housing Stability Vital Sign     Unable to Pay for Housing in the Last Year: No     Number of Places Lived in the Last Year: 1     Unstable Housing in the Last Year: No     Review of Systems    Objective:     Vitals:    10/12/22 1104   BP: 143/85   Pulse: 77   Resp: 16   Temp: 98.1 F (36.7 C)     Physical Exam  Constitutional:       General: She is not in acute distress.     Appearance: Normal appearance.   HENT:      Head: Normocephalic.   Eyes:      General: No scleral icterus.     Extraocular Movements:  Extraocular movements intact.   Cardiovascular:      Rate and Rhythm: Normal rate.   Pulmonary:       Effort: Pulmonary effort is normal. No respiratory distress.   Musculoskeletal:         General: No swelling.   Neurological:      Mental Status: She is alert and oriented to person, place, and time. Mental status is at baseline.         I personally reviewed the data below.    LAB DATA:   Component      Latest Ref Rng 10/07/2022   WBC      3.10 - 9.50 x10 3/uL 5.92    Hemoglobin      11.4 - 14.8 g/dL 69.6    Hematocrit      34.7 - 43.7 % 37.2    Platelet Count      142 - 346 x10 3/uL 204      Component      Latest Ref Rng 10/07/2022   Glucose Piccolo(R)      70 - 100 mg/dL 97    BUN Piccolo(R)      7.0 - 19.0 mg/dL 29.5    Creatinine Piccolo(R)      0.4 - 1.5 mg/dL 1.0    Sodium Piccolo(R)      136 - 145 mEq/L 135 (L)    Potassium Piccolo(R)      3.5 - 5.1 mEq/L 4.2    Chloride Piccolo(R)      98 - 107 mEq/L 106    CO2 Piccolo(R)      21 - 29 mEq/L 31 (H)    Calcium Piccolo(R)      8.5 - 10.5 mg/dL 9.7    Protein Piccolo(R)      6.4 - 8.1 g/dL 7.3    Albumin Piccolo(R)      3.3 - 5.0 g/dL 4.0    AST Piccolo(R)      11 - 38 U/L 25    ALT Piccolo(R)      10 - 47 U/L <20    Alkaline Phosphatase Piccolo(R)      42 - 141 U/L 68    Bilirubin Total Piccolo(R)      0.2 - 1.6 mg/dL 0.9       Component      Latest Ref Rng 10/07/2022   CEA      0.0 - 5.0 ng/mL <1.7          RADIOLOGY DATA:     06/10/22 CT C/A/P  IMPRESSION:    No evidence of metastatic disease.    Assessment & Plan:   Ms. Jalese Loux is a 67 year old woman with a history of stage 3 (T3/N1) rectal cancer, s/p total neoadjuvant therapy and LAR. She is 18 months out from completion of therapy.      1) Rectal cancer:  -she had a near CR to treatment with ypT1a/N0 disease (0/3 nodes). She completed treatment in 08/2020.  -she has no evidence of recurrence. Will continue to monitor.  -recommendations per NCCN for H/P every 3-6 months for 2y, then q42mo for a total of 5y. CEA at the same interval. It has been two years since she completed treatment; will extend follow  up interval to every 6 months.  -CT C/A/P every 6-12 months for a total of 5y. CT C/A/P in 06/2022 was normal. Will plan repeat CT in 04/2023, and then annually through 5 years.  -colonoscopy 06/2022 normal, follow  up in 06/2027 with Dr. Alcide Evener      2) SBO:  -prior SBOs due to adhesions from prior surgery. Plan is to consider LOA only if she has a recurrence of SBO.      3) Supportive care:  -Life with cancer  -Nutrition  -pelvic PT for incontinence and pelvic symptoms   -survivorship clinic        I spent a total of 30 minutes reviewing the chart, discussing the case with consultants, and discussing the plan of care with the patient on the date of service.     1. Rectal cancer  CT Chest Abdomen Pelvis W Contrast    CBC with Differential (Order)    CEA    RRL - CMP            Orders Placed This Encounter   Procedures    CT Chest Abdomen Pelvis W Contrast     With IV and PO contrast     Standing Status:   Future     Standing Expiration Date:   10/12/2023     Scheduling Instructions:      To schedule your procedure please call your chosen [facility] Scheduling Number:      [facility] Scheduling [phone]      [facility] Scheduling [phone]     Order Specific Question:   Reason for exam:     Answer:   rectal cancer follow up     Order Specific Question:   Release to patient     Answer:   Immediate    CBC with Differential (Order)     Standing Status:   Future     Standing Expiration Date:   10/12/2023     Order Specific Question:   Release to patient     Answer:   Immediate    CEA     Standing Status:   Future     Standing Expiration Date:   10/12/2023     Order Specific Question:   Release to patient     Answer:   Immediate    RRL - CMP     Standing Status:   Future     Standing Expiration Date:   10/12/2023     Order Specific Question:   Release to patient     Answer:   Immediate     Patient Instructions   1) CT CAP in 6 months  2) labs and office visit in 6 months, 1 week after  scans    Leonides Schanz, MD

## 2022-10-12 NOTE — Patient Instructions (Signed)
1) CT CAP in 6 months  2) labs and office visit in 6 months, 1 week after scans

## 2022-10-13 ENCOUNTER — Encounter (INDEPENDENT_AMBULATORY_CARE_PROVIDER_SITE_OTHER): Payer: Self-pay | Admitting: Internal Medicine

## 2022-10-22 ENCOUNTER — Other Ambulatory Visit (INDEPENDENT_AMBULATORY_CARE_PROVIDER_SITE_OTHER): Payer: Self-pay | Admitting: Internal Medicine

## 2022-12-08 ENCOUNTER — Ambulatory Visit (INDEPENDENT_AMBULATORY_CARE_PROVIDER_SITE_OTHER): Payer: Medicare Other | Admitting: Rheumatology

## 2022-12-08 ENCOUNTER — Encounter (INDEPENDENT_AMBULATORY_CARE_PROVIDER_SITE_OTHER): Payer: Self-pay | Admitting: Rheumatology

## 2022-12-08 VITALS — BP 138/72 | HR 76 | Wt 143.0 lb

## 2022-12-08 DIAGNOSIS — M720 Palmar fascial fibromatosis [Dupuytren]: Secondary | ICD-10-CM

## 2022-12-08 DIAGNOSIS — R768 Other specified abnormal immunological findings in serum: Secondary | ICD-10-CM

## 2022-12-08 DIAGNOSIS — M150 Primary generalized (osteo)arthritis: Secondary | ICD-10-CM

## 2022-12-08 NOTE — Progress Notes (Signed)
 Chief Complaint   Patient presents with    Consult (Initial)     N/P  Referred by: Leonides Schanz  After chemo, had neuropathy in fingertips and started norticing hand started hurting 2 years ago.  Fingers started "locking up" and started having a lot of pain a

## 2022-12-08 NOTE — Patient Instructions (Signed)
 Dear Aimee Watson ,       Here are things I'd like you to do following today's visit:    Your ANA was slightly positive. But there were no other evidence for connective tissue diseases such as lupus, sjogren's, myositis etc.     A positive ANA readi

## 2022-12-22 ENCOUNTER — Encounter (INDEPENDENT_AMBULATORY_CARE_PROVIDER_SITE_OTHER): Payer: Self-pay

## 2022-12-29 ENCOUNTER — Encounter: Payer: Self-pay | Admitting: Hematology and Oncology

## 2022-12-29 NOTE — Telephone Encounter (Signed)
Telephone call  

## 2022-12-30 ENCOUNTER — Other Ambulatory Visit: Payer: Self-pay

## 2023-01-19 ENCOUNTER — Encounter (INDEPENDENT_AMBULATORY_CARE_PROVIDER_SITE_OTHER): Payer: Self-pay | Admitting: Orthopaedic Surgery

## 2023-01-19 ENCOUNTER — Ambulatory Visit (INDEPENDENT_AMBULATORY_CARE_PROVIDER_SITE_OTHER): Payer: Medicare Other

## 2023-01-19 ENCOUNTER — Ambulatory Visit (INDEPENDENT_AMBULATORY_CARE_PROVIDER_SITE_OTHER): Payer: Medicare Other | Admitting: Orthopaedic Surgery

## 2023-01-19 VITALS — BP 180/93 | HR 98 | Ht 63.0 in | Wt 144.0 lb

## 2023-01-19 DIAGNOSIS — M65341 Trigger finger, right ring finger: Secondary | ICD-10-CM

## 2023-01-19 DIAGNOSIS — M150 Primary generalized (osteo)arthritis: Secondary | ICD-10-CM

## 2023-01-19 DIAGNOSIS — M65342 Trigger finger, left ring finger: Secondary | ICD-10-CM

## 2023-01-19 DIAGNOSIS — R768 Other specified abnormal immunological findings in serum: Secondary | ICD-10-CM

## 2023-01-19 DIAGNOSIS — M720 Palmar fascial fibromatosis [Dupuytren]: Secondary | ICD-10-CM

## 2023-01-19 NOTE — Progress Notes (Signed)
Orthopaedic Hand Patient Visit    Chief Complaint: Bilateral hand nodules, bilateral ring finger catching and locking    HPI:  Aimee Watson is a 67 y.o. right-hand-dominant retired female who presents for initial evaluation of her bilateral hand nodules as well as bilateral ring finger catching and locking.  These have been present for about 1 year.  She states that her grip strength is worsening since when she tries to grip an object she has pain in her ring fingers which makes her drop items.  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  She has not had any injections for this.  She has not had any therapy for this.  She has not had any surgery for this.      Past Medical History: Medical History[1]        Current Medications[2]     Allergies[3]     Past Medical History:   Diagnosis Date    Rectal adenocarcinoma     Small bowel obstruction         Past Surgical History[4]     12 Point Review of Systems:  noncontributory, except for what's listed in history.  ROS      Social History : No changes from listed in chart.     Tobacco Use: Low Risk  (01/19/2023)    Patient History     Smoking Tobacco Use: Never     Smokeless Tobacco Use: Never     Passive Exposure: Not on file          Physical Exam:   Pt is a 67 y.o. year old female who is awake, alert, and oriented.  Vitals:    01/19/23 1134   BP: (!) 180/93   Pulse: 98     General: Well appearing, well nourished  HEENT: Normocephalic  CV: RR per resting pulse  Resp: Nonlabored breathing    Right upper Extremity    No open wounds or rashes.  Firm subcutaneous nodules that are nonmobile in the palm.  Full range of motion of the wrist and digits.  All digits are able to be hyperextended at the metacarpophalangeal joint and fully extended at the PIP joint.  There is tenderness to palpation over the A1 pulley of the ring finger.  She is able to flex the ring finger into the distal palmar crease and extend  it where there is a palpable catch.  Sensation light touch is globally diminished but intact in the median ulnar and radial distribution.  Radial pulse 2+ and is warm well-perfused      Radiology:     X-ray of the right hand 3 views shows mild degenerative changes but no fractures or dislocations    X-ray left hand 3 view shows mild degenerative changes but no fractures or dislocations    Impression: Right ring trigger finger, left ring trigger finger, bilateral Dupuytren's nodules    Plan: We can start by treating this conservatively.  I will give her an injection into the right ring flexor tendon sheath and left ring flexor tendon sheath today.  She can see if this alleviates her symptoms.  If her symptoms or not alleviated by the injections alone we could also send her to occupational hand therapy.  She can give me a call if she wants a referral to hand therapy and we could give that to her if she needs.  Regarding her Dupuytren's nodules, she has no contracture  so we can treat this with observation.  Otherwise she can follow-up with me as needed    Procedure: Right ring flexor tendon sheath corticosteroid injection  After discussing the risks, benefits and alternative treatments to steroid injections to the patient in detail, informed consent was obtained. The patient's right ring finger was then prepped in a sterile fashion.  Followed by the use of ethyl chloride for anesthesia of the skin, a 25-gauge needle was introduced over the A1 pulley with the injection of 1mL 1% Xylocaine and 6mg  of Betamethasone. The needle was removed, a band-aid was applied.     Procedure: Left ring flexor tendon sheath corticosteroid injection  After discussing the risks, benefits and alternative treatments to steroid injections to the patient in detail, informed consent was obtained. The patient's left ring finger was then prepped in a sterile fashion.  Followed by the use of ethyl chloride for anesthesia of the skin, a 25-gauge  needle was introduced over the A1 pulley with the injection of 1mL 1% Xylocaine and 6mg  of Betamethasone. The needle was removed, a band-aid was applied.           Follow Up: As needed  Xrays at next visit: None    The review of the patient's medications does not in any way constitute an endorsement, by this clinician,  of their use, dosage, indications, route, efficacy, interactions, or other clinical parameters.    This note was generated within the [employer] EMR using Dragon medical speech recognition software and may contain inherent errors or omissions not intended by the user. Grammatical and punctuation errors, random word insertions, deletions, pronoun errors and incomplete sentences are occasional consequences of this technology due to software limitations. Not all errors are caught or corrected.  Although every attempt is made to root out erroneus and incomplete transcription, the note may still not fully represent the intent or opinion of the author. If there are questions or concerns about the content of this note or information contained within the body of this dictation they should be addressed directly with the author for clarification.             [1]   Past Medical History:  Diagnosis Date    Rectal adenocarcinoma     Small bowel obstruction    [2]   Current Outpatient Medications:     ascorbic acid (VITAMIN C) 250 MG tablet, Take 1 tablet (250 mg) by mouth, Disp: , Rfl:     Cholecalciferol (Vitamin D3) 2000 UNIT capsule, Take 1 capsule (2,000 Units) by mouth, Disp: , Rfl:     L-Lysine HCl 500 MG Tab, Take by mouth every evening, Disp: , Rfl:     melatonin 3 mg tablet, Take 1 tablet (3 mg) by mouth nightly, Disp: , Rfl:     Multiple Vitamins-Minerals (Multi Vitamin/Minerals) Tab, Take 1 tablet by mouth, Disp: , Rfl:     omeprazole (PriLOSEC) 20 MG capsule, Take 1 capsule (20 mg) by mouth, Disp: , Rfl:     vitamin B-12 (CYANOCOBALAMIN) 1000 MCG tablet, Take 1 tablet (1,000 mcg) by mouth daily Gummies,  Disp: , Rfl:     Wheat Dextrin (BENEFIBER PO), Take by mouth, Disp: , Rfl:     zinc gluconate 50 MG tablet, Take 1 tablet (50 mg) by mouth, Disp: , Rfl:   [3]   Allergies  Allergen Reactions    Penicillins Rash   [4]   Past Surgical History:  Procedure Laterality Date    COLONOSCOPY, SCREENING  N/A 06/17/2022    Procedure: COLONOSCOPY, SCREENING;  Surgeon: Guadalupe Dawn, MD;  Location: [facility];  Service: Gastroenterology;  Laterality: N/A;    ROBOTIC, COLECTOMY, LAR  10/21/2020    TAKEDOWN ILEOSTOMY  03/24/2021

## 2023-04-11 ENCOUNTER — Ambulatory Visit: Payer: Medicare Other | Attending: Hematology

## 2023-04-11 ENCOUNTER — Ambulatory Visit: Payer: Medicare Other | Attending: Anatomic and Clinical Pathology

## 2023-04-11 DIAGNOSIS — C20 Malignant neoplasm of rectum: Secondary | ICD-10-CM | POA: Insufficient documentation

## 2023-04-11 LAB — RRL - CMP
ALT Piccolo(R): <20 U/L (ref 10–47)
AST Piccolo(R): 30 U/L (ref 11–38)
Albumin Piccolo(R): 3.8 g/dL (ref 3.3–5.0)
Alkaline Phosphatase Piccolo(R): 72 U/L (ref 42–141)
Anion Gap Piccolo(R): 2
BUN Piccolo(R): 13 mg/dL (ref 7.0–19.0)
Bilirubin Total Piccolo(R): 1.1 mg/dL (ref 0.2–1.6)
CO2 Piccolo(R): 32 meq/L — ABNORMAL HIGH (ref 21–29)
Calcium Piccolo(R): 9.5 mg/dL (ref 8.5–10.5)
Chloride Piccolo(R): 108 meq/L — ABNORMAL HIGH (ref 98–107)
Creatinine Piccolo(R): 0.9 mg/dL (ref 0.4–1.5)
GFR: >60 mg/dL (ref >=60.0)
Glucose Piccolo(R): 105 mg/dL — ABNORMAL HIGH (ref 70–100)
Potassium Piccolo(R): 4.1 meq/L (ref 3.5–5.1)
Protein Piccolo(R): 7.5 g/dL (ref 6.4–8.1)
Sodium Piccolo(R): 142 meq/L (ref 136–145)

## 2023-04-11 LAB — LAB USE ONLY - CBC WITH DIFFERENTIAL
Absolute Basophils: 0.05 10*3/uL (ref 0.00–0.08)
Absolute Eosinophils: 0.1 10*3/uL (ref 0.00–0.44)
Absolute Immature Granulocytes: 0.01 10*3/uL (ref 0.00–0.07)
Absolute Lymphocytes: 1.09 10*3/uL (ref 0.42–3.22)
Absolute Monocytes: 0.47 10*3/uL (ref 0.21–0.85)
Absolute Neutrophils: 2.19 10*3/uL (ref 1.10–6.33)
Absolute nRBC: 0 10*3/uL (ref <=0.00)
Basophils %: 1.3 %
Eosinophils %: 2.6 %
Hematocrit: 36.7 % (ref 34.7–43.7)
Hemoglobin: 12.3 g/dL (ref 11.4–14.8)
Immature Granulocytes %: 0.3 %
Lymphocytes %: 27.9 %
MCH: 30 pg (ref 25.1–33.5)
MCHC: 33.5 g/dL (ref 31.5–35.8)
MCV: 89.5 fL (ref 78.0–96.0)
MPV: 10.2 fL (ref 8.9–12.5)
Monocytes %: 12 %
Neutrophils %: 55.9 %
Platelet Count: 214 10*3/uL (ref 142–346)
Preliminary Absolute Neutrophil Count: 2.19 10*3/uL (ref 1.10–6.33)
RBC: 4.1 10*6/uL (ref 3.90–5.10)
RDW: 13 % (ref 11–15)
WBC: 3.91 10*3/uL (ref 3.10–9.50)
nRBC %: 0 /100{WBCs} (ref <=0.0)

## 2023-04-11 LAB — CEA: CEA: <1.7 ng/mL (ref 0.0–5.0)

## 2023-04-11 MED ORDER — IOHEXOL 12 MG/ML PO SOLN
1000.0000 mL | Freq: Once | ORAL | Status: AC | PRN
Start: 2023-04-11 — End: 2023-04-11
  Administered 2023-04-11: 1000 mL via ORAL

## 2023-04-11 MED ORDER — IOHEXOL 350 MG/ML IV SOLN
100.0000 mL | Freq: Once | INTRAVENOUS | Status: AC | PRN
Start: 2023-04-11 — End: 2023-04-11
  Administered 2023-04-11: 100 mL via INTRAVENOUS

## 2023-04-19 ENCOUNTER — Ambulatory Visit: Payer: Medicare Other | Attending: Internal Medicine | Admitting: Hematology

## 2023-04-19 ENCOUNTER — Encounter: Payer: Self-pay | Admitting: Hematology

## 2023-04-19 VITALS — BP 134/68 | HR 75 | Temp 97.8°F | Resp 16 | Wt 144.0 lb

## 2023-04-19 DIAGNOSIS — Z85048 Personal history of other malignant neoplasm of rectum, rectosigmoid junction, and anus: Secondary | ICD-10-CM | POA: Insufficient documentation

## 2023-04-19 DIAGNOSIS — C20 Malignant neoplasm of rectum: Secondary | ICD-10-CM | POA: Insufficient documentation

## 2023-04-19 DIAGNOSIS — Z932 Ileostomy status: Secondary | ICD-10-CM | POA: Insufficient documentation

## 2023-04-19 NOTE — Progress Notes (Signed)
 [facility]  [address]  [place], [place] [address]  P: [phone]  F: [phone]        [facility]  [address], [address]  [place], [place] [address]  P: [phone]  F: [phone]    Gastroenterologist: Dr. Charleen Fairly  Surgeon: Dr. Jerilynn Levels    CHIEF COMPLAINT  Rectal cancer    HISTORY OF PRESENT ILLNESS  Aimee Watson is a 68 y.o. woman with a history of rectal cancer.     1) Rectal cancer:  -01/2020 colonoscopy for rectal bleeding revealed a mass 2cm from the anal verge. Biopsy confirmed adenocarcinoma. There was no e/o metastatic disease by CT. An MRI revealed a T2-T3 cancer with involvement of the internal sphincter and 2 positive mesorectal nodes.   -02/2020 neoadjuvant FOLFOX, followed by chemoRT 07/2020-08/2020.   -LAR with coloanal anastomosis and ypT1a/N0 disease (0/3 nodes), a near CR to therapy. She had MSI-L disease.  -03/2021 ostomy reversal   -postop course c/b bladder prolapse and urinary incontinence, frequent and loose bowel movements.    2) SBO:  -9/10-9/12/23 and 10/27-10/29/23 admitted to [facility] with SBO. The transition point was near the ileostomy reversal site. The SBO resolved with conservative management.      INTERIM HISTORY  Ms. Baray returns to the office for follow up.     It has now been 2.5 years since completing treatment. She reports stable symptoms of frequent and loose bowel movements. This is unchanged from prior.    She denies any change in sensory neuropathy.    She saw orthopedics and had injections for trigger finger which was successful and gave her some relief.    She had a colonoscopy on 06/17/22 which revealed normal mucosa and she was recommended for 5 year follow up with Dr. Fairly.    I reviewed notes from the PMD, survivorship clinic, ortho on the date of service.    Allergies   Allergen Reactions    Penicillins Rash     Current Outpatient Medications   Medication Sig Dispense Refill     ascorbic acid (VITAMIN C) 250 MG tablet Take 1 tablet (250 mg) by mouth      Cholecalciferol (Vitamin D3) 2000 UNIT capsule Take 1 capsule (2,000 Units) by mouth      L-Lysine HCl 500 MG Tab Take by mouth every evening      melatonin 3 mg tablet Take 1 tablet (3 mg) by mouth nightly      Multiple Vitamins-Minerals (Multi Vitamin/Minerals) Tab Take 1 tablet by mouth      omeprazole (PriLOSEC) 20 MG capsule Take 1 capsule (20 mg) by mouth      vitamin B-12 (CYANOCOBALAMIN) 1000 MCG tablet Take 1 tablet (1,000 mcg) by mouth daily Gummies      Wheat Dextrin (BENEFIBER PO) Take by mouth      zinc gluconate 50 MG tablet Take 1 tablet (50 mg) by mouth       No current facility-administered medications for this visit.     Past Medical History:   Diagnosis Date    Rectal adenocarcinoma (CMS/HCC)     Small bowel obstruction (CMS/HCC)      Past Surgical History:   Procedure Laterality Date    COLONOSCOPY, SCREENING N/A 06/17/2022    Procedure: COLONOSCOPY, SCREENING;  Surgeon: Fairly Charleen SAILOR, MD;  Location: [facility];  Service: Gastroenterology;  Laterality: N/A;    ROBOTIC, COLECTOMY, LAR  10/21/2020  TAKEDOWN ILEOSTOMY  03/24/2021     Family History   Problem Relation Name Age of Onset    Rheum arthritis Mother      Diabetes Mother       Social History     Socioeconomic History    Marital status: Widowed   Tobacco Use    Smoking status: Never    Smokeless tobacco: Never   Vaping Use    Vaping status: Never Used   Substance and Sexual Activity    Alcohol use: Yes     Comment: 1-2 a month of wine    Drug use: Never    Sexual activity: Not Currently     Social Drivers of Health     Financial Resource Strain: Low Risk  (08/04/2022)    Overall Financial Resource Strain (CARDIA)     Difficulty of Paying Living Expenses: Not hard at all   Food Insecurity: No Food Insecurity (08/04/2022)    Hunger Vital Sign     Worried About Running Out of Food in the Last Year: Never true     Ran Out of Food in the Last Year:  Never true   Transportation Needs: No Transportation Needs (08/04/2022)    PRAPARE - Therapist, Art (Medical): No     Lack of Transportation (Non-Medical): No   Physical Activity: Sufficiently Active (08/04/2022)    Exercise Vital Sign     Days of Exercise per Week: 5 days     Minutes of Exercise per Session: 60 min   Stress: No Stress Concern Present (08/04/2022)    [employer] of Occupational Health - Occupational Stress Questionnaire     Feeling of Stress : Not at all   Social Connections: Moderately Isolated (08/04/2022)    Social Connection and Isolation Panel [NHANES]     Frequency of Communication with Friends and Family: More than three times a week     Frequency of Social Gatherings with Friends and Family: More than three times a week     Attends Religious Services: More than 4 times per year     Active Member of [employer] or Organizations: No     Attends Banker Meetings: Never     Marital Status: Widowed   Intimate Partner Violence: Not At Risk (08/04/2022)    Humiliation, Afraid, Rape, and Kick questionnaire     Fear of Current or Ex-Partner: No     Emotionally Abused: No     Physically Abused: No     Sexually Abused: No   Housing Stability: Low Risk  (08/04/2022)    Housing Stability Vital Sign     Unable to Pay for Housing in the Last Year: No     Number of Places Lived in the Last Year: 1     Unstable Housing in the Last Year: No     Review of Systems    Objective:     Vitals:    04/19/23 1042   BP: 134/68   Pulse: 75   Resp: 16   Temp: 97.8 F (36.6 C)   SpO2: 99%     Physical Exam  Constitutional:       General: She is not in acute distress.     Appearance: Normal appearance.   HENT:      Head: Normocephalic.   Eyes:      General: No scleral icterus.     Extraocular Movements: Extraocular movements intact.   Cardiovascular:  Rate and Rhythm: Normal rate.   Pulmonary:      Effort: Pulmonary effort is normal. No respiratory distress.   Musculoskeletal:          General: No swelling.   Neurological:      Mental Status: She is alert and oriented to person, place, and time. Mental status is at baseline.         I personally reviewed the data below.    LAB DATA:   Component      Latest Ref Rng 04/11/2023   WBC      3.10 - 9.50 x10 3/uL 3.91    Hemoglobin      11.4 - 14.8 g/dL 87.6    Hematocrit      34.7 - 43.7 % 36.7    Platelet Count      142 - 346 x10 3/uL 214        Component      Latest Ref Rng 04/11/2023   Glucose Piccolo(R)      70 - 100 mg/dL 894 (H)    BUN Piccolo(R)      7.0 - 19.0 mg/dL 86.9    Creatinine Piccolo(R)      0.4 - 1.5 mg/dL 0.9    Sodium Piccolo(R)      136 - 145 mEq/L 142    Potassium Piccolo(R)      3.5 - 5.1 mEq/L 4.1    Chloride Piccolo(R)      98 - 107 mEq/L 108 (H)    CO2 Piccolo(R)      21 - 29 mEq/L 32 (H)    Calcium Piccolo(R)      8.5 - 10.5 mg/dL 9.5    Protein Piccolo(R)      6.4 - 8.1 g/dL 7.5    Albumin Piccolo(R)      3.3 - 5.0 g/dL 3.8    AST Piccolo(R)      11 - 38 U/L 30    ALT Piccolo(R)      10 - 47 U/L <20    Alkaline Phosphatase Piccolo(R)      42 - 141 U/L 72    Bilirubin Total Piccolo(R)      0.2 - 1.6 mg/dL 1.1       Component      Latest Ref Rng 04/11/2023   CEA      0.0 - 5.0 ng/mL <1.7          RADIOLOGY DATA:   04/18/23 CT C/A/P  IMPRESSION:    No evidence of residual or recurrent disease.       Assessment & Plan:   Ms. Wadie Mattie is a 68 year old woman with a history of stage 3 (T3/N1) rectal cancer, s/p total neoadjuvant therapy and LAR. She is 18 months out from completion of therapy.      1) Rectal cancer:  -she had a near CR to treatment with ypT1a/N0 disease (0/3 nodes). She completed treatment in 08/2020.  -she has no evidence of recurrence. Will continue to monitor.  -recommendations per NCCN for H/P every 3-6 months for 2y, then q6mo for a total of 5y. CEA at the same interval. Will follow up every 6 months, scans annually through 5 years.  -colonoscopy 06/2022 normal, follow up in 06/2027 with Dr. Charleen Fairly      2)  SBO:  -prior SBOs due to adhesions from prior surgery. Plan is to consider LOA only if she has a recurrence of SBO.  She will see me in 6 months.      I spent a total of 30 minutes reviewing the chart, discussing the case with consultants, and discussing the plan of care with the patient on the date of service.     No diagnosis found.        No orders of the defined types were placed in this encounter.    There are no Patient Instructions on file for this visit.    Vernel Ruth, MD

## 2023-07-14 ENCOUNTER — Ambulatory Visit (INDEPENDENT_AMBULATORY_CARE_PROVIDER_SITE_OTHER): Payer: Medicare Other | Admitting: Internal Medicine

## 2023-08-02 ENCOUNTER — Ambulatory Visit (INDEPENDENT_AMBULATORY_CARE_PROVIDER_SITE_OTHER): Admitting: Family Nurse Practitioner

## 2023-08-02 ENCOUNTER — Encounter (INDEPENDENT_AMBULATORY_CARE_PROVIDER_SITE_OTHER): Payer: Self-pay

## 2023-08-02 VITALS — BP 142/84 | HR 78 | Temp 97.2°F | Resp 18 | Ht 63.0 in | Wt 141.0 lb

## 2023-08-02 DIAGNOSIS — J019 Acute sinusitis, unspecified: Secondary | ICD-10-CM

## 2023-08-02 DIAGNOSIS — B9689 Other specified bacterial agents as the cause of diseases classified elsewhere: Secondary | ICD-10-CM

## 2023-08-02 MED ORDER — DOXYCYCLINE HYCLATE 100 MG PO TABS
100.0000 mg | ORAL_TABLET | Freq: Two times a day (BID) | ORAL | 0 refills | Status: AC
Start: 2023-08-02 — End: 2023-08-09

## 2023-08-02 NOTE — Patient Instructions (Addendum)
 You were seen today for sinusitis symptoms  Take Doxycycline as prescribed  Apply warm facial pads as needed  Do nasal wash with warm saline water  Increase rest periods  Increase hydration  You may also take Vitamin C and Zinc for symptoms relief  Ibuprofen or Tylenol  for Headaches  Afrin nasal spray twice daily x 3 days    Flonase nasal spray  Return to center if symptoms do not improve  Go to ER if symptoms worsen.

## 2023-08-02 NOTE — Progress Notes (Signed)
 Patient: Aimee Watson   Date: 08/02/2023   MRN: [id number]       Subjective     Chief Complaint   Patient presents with    Cough     Pt c/o cough, R ear pain, sinus pressure and drainage into the throat x 10 days and dizziness, chills x 1 day.      HPI    Aimee Watson is a 68 y.o. female with c/o upper respiratory symptoms. Patient reports symptoms started with nasal drainage into her throat for a month. Then progressed to right sided sinus pressure, right sided headaches, right ear pain, tooth pain on the right side, and greenish discharge x 10 days. And reports dizziness and chills last night. Patient denies sore throat, fever, myalgia, vomiting, SOB, and chest pain. Use Flonase with mild relief.    Pertinent Past Medical, Surgical, Family and Social History were reviewed.    Current Medications[1]    Allergies[2]    Medications and Allergies reviewed.     Objective     Vitals:    08/02/23 0929   BP: 142/84   Pulse: 78   Resp: 18   Temp: 97.2 F (36.2 C)   SpO2: 98%     Body mass index is 24.98 kg/m.    Physical Exam    General: well developed, well nourished, no acute distress.     Eyes: No conjunctival injection or discharge.    Ear: TM without erythema, bulging or perforation; normal auditory canals and pinnae; no mastoid tenderness.    Nose/sinus: Nasal congestion., R maxillary sinus tenderness., and R frontal sinus tenderness.    Throat: no posterior oropharynx erythema, swelling, exudates or peritonsillar bulging; normal, midline uvula; no trismus or drooling.    Neck: No stridor. Normal ROM.    Lung: normal work of breathing, speaking complete sentences, no rales, wheezing or rhonchi.    Heart: Regular rhythm, no murmurs.    UCC COURSE  There were no labs reviewed with this patient during the visit.    There were no x-rays reviewed with this patient during the visit.    Current Inpatient Medications with Last Dose Taken[3]      Procedures    MDM:  History physical, labs/studies most  consistent with acute bacterial sinusitis as diagnosis.     Differential Diagnosis: Allergic rhinitis, Nonallergic rhinitis, migraine, adenoiditis.     Assessment   Aimee Watson was seen today for cough.    Diagnoses and all orders for this visit:    Acute bacterial sinusitis  -     doxycycline (VIBRA-TABS) 100 MG tablet; Take 1 tablet (100 mg) by mouth 2 (two) times daily for 7 days    Plan and follow-up discussed with patient. See AVS for further documentation.         [1]   Current Outpatient Medications:     ascorbic acid (VITAMIN C) 250 MG tablet, Take 1 tablet (250 mg) by mouth, Disp: , Rfl:     Cholecalciferol (Vitamin D3) 2000 UNIT capsule, Take 1 capsule (2,000 Units) by mouth, Disp: , Rfl:     L-Lysine HCl 500 MG Tab, Take by mouth every evening, Disp: , Rfl:     melatonin 3 mg tablet, Take 1 tablet (3 mg) by mouth nightly, Disp: , Rfl:     Multiple Vitamins-Minerals (Multi Vitamin/Minerals) Tab, Take 1 tablet by mouth, Disp: , Rfl:     omeprazole (PriLOSEC) 20 MG capsule, Take 1 capsule (20 mg) by mouth, Disp: ,  Rfl:     vitamin B-12 (CYANOCOBALAMIN) 1000 MCG tablet, Take 1 tablet (1,000 mcg) by mouth daily Gummies, Disp: , Rfl:     Wheat Dextrin (BENEFIBER PO), Take by mouth, Disp: , Rfl:     zinc gluconate 50 MG tablet, Take 1 tablet (50 mg) by mouth, Disp: , Rfl:     doxycycline (VIBRA-TABS) 100 MG tablet, Take 1 tablet (100 mg) by mouth 2 (two) times daily for 7 days, Disp: 14 tablet, Rfl: 0  [2]   Allergies  Allergen Reactions    Penicillins Rash   [3]   No current facility-administered medications for this visit.

## 2023-08-26 ENCOUNTER — Ambulatory Visit (INDEPENDENT_AMBULATORY_CARE_PROVIDER_SITE_OTHER)

## 2023-08-26 ENCOUNTER — Encounter (INDEPENDENT_AMBULATORY_CARE_PROVIDER_SITE_OTHER): Payer: Self-pay

## 2023-08-26 VITALS — BP 155/74 | HR 63 | Temp 97.0°F | Resp 12 | Ht 63.0 in | Wt 141.0 lb

## 2023-08-26 DIAGNOSIS — R030 Elevated blood-pressure reading, without diagnosis of hypertension: Secondary | ICD-10-CM

## 2023-08-26 DIAGNOSIS — S40862A Insect bite (nonvenomous) of left upper arm, initial encounter: Secondary | ICD-10-CM

## 2023-08-26 MED ORDER — TRIAMCINOLONE ACETONIDE 0.1 % EX OINT
TOPICAL_OINTMENT | Freq: Two times a day (BID) | CUTANEOUS | 0 refills | Status: AC
Start: 2023-08-26 — End: 2023-09-02

## 2023-08-26 MED ORDER — DIPHENHYDRAMINE HCL 25 MG PO TABS
25.0000 mg | ORAL_TABLET | Freq: Every evening | ORAL | Status: AC | PRN
Start: 2023-08-26 — End: ?

## 2023-08-26 MED ORDER — CETIRIZINE HCL 10 MG PO TABS
10.0000 mg | ORAL_TABLET | Freq: Every day | ORAL | Status: AC
Start: 2023-08-26 — End: 2023-09-25

## 2023-08-26 NOTE — Progress Notes (Signed)
 [facility]  OFFICE NOTE         Subjective   Historian: Patient      Chief Complaint   Patient presents with    Insect Bite     On right upper arm, painful, swollen, spreading down to the arm , blisters x 2 days   Pt got bit by a yellow jacket bee       Insect Bite    Aimee Watson is a 68 y.o. female who presents for insect bite to right upper arm x 2 days.  Reports area is swollen, erythematous, itchy, and blistering.  She denies angioedema, orofacial rash, trouble breathing, wheezing, nausea, or vomiting.  She tried Benadryl  and OTC hydrocortisone without much relief.    History:  Medications and Allergies reviewed.   Pertinent Past Medical, Surgical, Family and Social History were reviewed.          Objective     Vitals:    08/26/23 0944 08/26/23 1006   BP: 163/77 155/74   BP Site: Left arm Left arm   Patient Position: Sitting Sitting   Cuff Size: Medium Medium   Pulse: 63    Resp: 12    Temp: 97 F (36.1 C)    TempSrc: Tympanic    SpO2: 100%    Weight: 64 kg (141 lb)    Height: 1.6 m (5' 3)      Physical Exam  Constitutional:       General: She is not in acute distress.     Appearance: Normal appearance. She is normal weight. She is not ill-appearing, toxic-appearing or diaphoretic.   Cardiovascular:      Rate and Rhythm: Normal rate and regular rhythm.      Pulses: Normal pulses.      Heart sounds: Normal heart sounds.   Pulmonary:      Effort: Pulmonary effort is normal.      Breath sounds: Normal breath sounds.   Neurological:      General: No focal deficit present.      Mental Status: She is alert and oriented to person, place, and time.   Skin:     General: Skin is warm.      Findings: Erythema (Localized erythema with swelling, 1 blister noted.  Area blanchable, no warmth, open areas noted) present.   Vitals reviewed.       Urgent Care Course   There were no labs reviewed with this patient during the visit.    There were no x-rays reviewed with this patient during the visit.      Procedures    Procedures     Assessment / Plan     Differential Diagnoses including but not limited to: Cellulitis, abscesses, folliculitis, contact dermatitis      Recommend applying ice to right upper arm every 3-4 hours for 15 to 20 minutes.  Avoid applying directly to skin.  Reassured insect bite can last at least 3 to 7 days.  Clean area daily with soap and water.  Take cetirizine  10 mg by mouth as needed for itching.  Apply Triamcinolone  0.1% to right upper arm twice daily for 7 days.   Apply emollient such as CeraVe ointment to help hydrate skin.  Elevate right arm 3 times a day and avoid de-roofing blister.  May take Benadryl  25 mg by mouth as needed at night.  Avoid scratching area.  Wear long sleeves when outside.  Use insect repellent during outdoor exposures.    May take  Tylenol  as needed for pain.  Your blood pressure was elevated during today's visit.  Recommend checking blood pressure at home and maintaining a log for 1 to 2 weeks.  Recommend lifestyle modification by maintaining a healthy meal plan and engaging in daily physical activities.  Follow-up with primary care for blood pressure monitoring.  Monitor for any new or worsening symptoms such as increased erythema, swelling, streaking, new fevers, or any new or concerning symptoms.  Return to center if these symptoms occur. Patient acknowledge understanding of plan of care.       Aimee Watson was seen today for insect bite.    Diagnoses and all orders for this visit:    Insect bite of left upper arm with local reaction, initial encounter  -     cetirizine  (ZyrTEC ) 10 MG tablet; Take 1 tablet (10 mg) by mouth once daily  -     triamcinolone  (KENALOG ) 0.1 % ointment; Apply topically 2 (two) times daily for 7 days Apply to right upper arm  -     diphenhydrAMINE  (BENADRYL ) 25 MG tablet; Take 1 tablet (25 mg) by mouth at bedtime as needed for Allergies    Elevated blood pressure reading in office without diagnosis of hypertension         The indications for early  follow-up with PCP and return to UC were discussed. Patient/family received education on the working diagnosis, diagnostic uncertainties, and proposed treatment plan. Indications for emergency evaluation in the ED were reviewed. Written and verbal discharge instructions were provided and discussed and all questions from the patient/family were addressed, with no apparent barriers.

## 2023-08-26 NOTE — Patient Instructions (Addendum)
 Recommend applying ice to right upper arm every 3-4 hours for 15 to 20 minutes.  Avoid applying directly to skin.  Reassured insect bite skin last at least 3 to 7 days.  Clean area daily with soap and water.  Take cetirizine  10 mg by mouth as needed for itching.  Apply Triamcinolone  0.1% to right upper arm twice daily.   Apply emollient such as CeraVe ointment to help hydrate skin.  Elevate right arm and avoid de-roofing blister.  May take Benadryl  25 mg by mouth as needed at night.  Avoid scratching area.  Wear long sleeves when outside.  Use insect repellent during outdoor exposures.    Your blood pressure was elevated during today's visit.  Recommend checking blood pressure at home and maintaining a log for 1 to 2 weeks.  Recommend lifestyle modification by maintaining a healthy meal plan and engaging in daily physical activities.  Follow-up with primary care for blood pressure monitoring.  Monitor for any new or worsening symptoms such as increased erythema, swelling, streaking, new fevers, or any new or concerning symptoms.  Return to center if these symptoms occur. Patient acknowledge understanding of plan of care.

## 2023-09-23 ENCOUNTER — Encounter (INDEPENDENT_AMBULATORY_CARE_PROVIDER_SITE_OTHER): Payer: Self-pay | Admitting: Nurse Practitioner

## 2023-09-23 ENCOUNTER — Ambulatory Visit (INDEPENDENT_AMBULATORY_CARE_PROVIDER_SITE_OTHER): Admitting: Nurse Practitioner

## 2023-09-23 VITALS — BP 146/87 | HR 94 | Temp 97.4°F | Resp 19 | Ht 63.0 in | Wt 145.0 lb

## 2023-09-23 DIAGNOSIS — R053 Chronic cough: Secondary | ICD-10-CM

## 2023-09-23 DIAGNOSIS — R0982 Postnasal drip: Secondary | ICD-10-CM

## 2023-09-23 DIAGNOSIS — B9689 Other specified bacterial agents as the cause of diseases classified elsewhere: Secondary | ICD-10-CM

## 2023-09-23 DIAGNOSIS — J019 Acute sinusitis, unspecified: Secondary | ICD-10-CM

## 2023-09-23 MED ORDER — DOXYCYCLINE HYCLATE 100 MG PO TABS
100.0000 mg | ORAL_TABLET | Freq: Two times a day (BID) | ORAL | 0 refills | Status: AC
Start: 2023-09-23 — End: 2023-09-30

## 2023-09-23 MED ORDER — PROMETHAZINE-DM 6.25-15 MG/5ML PO SYRP
5.0000 mL | ORAL_SOLUTION | Freq: Four times a day (QID) | ORAL | 0 refills | Status: AC | PRN
Start: 2023-09-23 — End: ?

## 2023-09-23 NOTE — Progress Notes (Unsigned)
 Patient: Aimee Watson    Date: 09/26/2023   MRN: [id number]        Subjective        Chief Complaint   Patient presents with    Sinus Problem     Pt has sinus pressure. Onset 3 weeks         HPI   Aimee Watson is a 68 y.o. female presenting to Urgent Care with complaint of sinus congestion.  Has had URI symptoms for 3 weeks. Now it's got into her sinus and ears. Having sinus congestions and ear pain.  Has been coughing and it's improving a little bit.  Has  Hx of seasonal allergies. Has been taking zyrtec  but been feeling jittery and stopped.  Denies fever, chills or sob.   History:  Pertinent Past Medical, Surgical, Family and Social History were reviewed.   Current Medications[1]  Allergies[2]  Medications and Allergies reviewed.         Objective   Vitals:    09/23/23 1112   BP: 146/87   Pulse: 94   Resp: 19   Temp: 97.4 F (36.3 C)   TempSrc: Tympanic   SpO2: 98%   Weight: 65.8 kg (145 lb)   Height: 1.6 m (5' 3)     Body mass index is 25.69 kg/m.    Physical Exam  Constitutional:       General: She is not in acute distress.     Appearance: Normal appearance. She is not ill-appearing.   HENT:      Head: Normocephalic and atraumatic.      Right Ear: Tympanic membrane and ear canal normal.      Left Ear: Tympanic membrane and ear canal normal.      Nose: Congestion and rhinorrhea present.      Mouth/Throat:      Mouth: Mucous membranes are moist.      Pharynx: Posterior oropharyngeal erythema present. No oropharyngeal exudate.   Eyes:      Extraocular Movements: Extraocular movements intact.      Conjunctiva/sclera: Conjunctivae normal.      Pupils: Pupils are equal, round, and reactive to light.   Cardiovascular:      Rate and Rhythm: Normal rate and regular rhythm.      Heart sounds: No murmur heard.     No friction rub. No gallop.   Pulmonary:      Effort: Pulmonary effort is normal. No respiratory distress.      Breath sounds: No stridor. No wheezing, rhonchi or rales.   Chest:      Chest wall: No tenderness.    Neurological:      Mental Status: She is alert.              UCC Course   There were no labs reviewed with this patient during the visit.  There were no x-rays reviewed with this patient during the visit.  Current Inpatient Medications with Last Dose Taken[3]       Procedures   Procedures      MDM/Assessment    a 68 y.o. female presenting to Urgent Care with complaint of sinus congestion.  Has had URI symptoms for 3 weeks. Now it's got into her sinus and ears. Having sinus congestions and ear pain.  Has been coughing and it's improving a little bit.  Has  Hx of seasonal allergies. Has been taking zyrtec  but been feeling jittery and stopped.  Denies fever, chills or sob.  Physical exams were unremarkable.  Rxd doxy for acute bacterial sinusitis given PNC allergy.  Advised to continue flonase for PND.     Rxd promethazine  dm for persistent cough.  Follow up in 5-7 days for persistent symptoms, sooner for worsening.     Patient expresses understanding and agrees with plan    Differential Diagnosis: Sinusitis, Bronchitis, Pharyngitis, Pneumonia, Influenza, COVID-19 and Allergic Rhinitis  Encounter Diagnoses   Name Primary?    Acute bacterial sinusitis Yes    Persistent cough for 3 weeks or longer     PND (post-nasal drip)             Plan  See MDM    Discussed results and diagnosis with patient/family.  Reviewed warning signs for worsening condition, as well as, indications for follow-up with primary care physician and return to urgent care clinic.   Patient/family expressed understanding of instructions.     An After Visit Summary with pertinent information was made available to patient/family via MyChart or in-print.           [1]   Current Outpatient Medications:     ascorbic acid (VITAMIN C) 250 MG tablet, Take 1 tablet (250 mg) by mouth, Disp: , Rfl:     Cholecalciferol (Vitamin D3) 2000 UNIT capsule, Take 1 capsule (2,000 Units) by mouth, Disp: , Rfl:     diphenhydrAMINE  (BENADRYL ) 25 MG tablet, Take 1 tablet  (25 mg) by mouth at bedtime as needed for Allergies, Disp: , Rfl:     L-Lysine HCl 500 MG Tab, Take by mouth every evening, Disp: , Rfl:     melatonin 3 mg tablet, Take 1 tablet (3 mg) by mouth nightly, Disp: , Rfl:     Multiple Vitamins-Minerals (Multi Vitamin/Minerals) Tab, Take 1 tablet by mouth, Disp: , Rfl:     omeprazole (PriLOSEC) 20 MG capsule, Take 1 capsule (20 mg) by mouth, Disp: , Rfl:     vitamin B-12 (CYANOCOBALAMIN) 1000 MCG tablet, Take 1 tablet (1,000 mcg) by mouth daily Gummies, Disp: , Rfl:     Wheat Dextrin (BENEFIBER PO), Take by mouth, Disp: , Rfl:     zinc gluconate 50 MG tablet, Take 1 tablet (50 mg) by mouth, Disp: , Rfl:     doxycycline  (VIBRA -TABS) 100 MG tablet, Take 1 tablet (100 mg) by mouth 2 (two) times daily for 7 days, Disp: 14 tablet, Rfl: 0    promethazine -dextromethorphan (PROMETHAZINE -DM) 6.25-15 MG/5ML syrup, Take 5 mLs by mouth 4 (four) times daily as needed for Cough, Disp: 180 mL, Rfl: 0  [2]   Allergies  Allergen Reactions    Penicillins Rash   [3]   No current facility-administered medications for this visit.

## 2023-09-26 ENCOUNTER — Encounter (INDEPENDENT_AMBULATORY_CARE_PROVIDER_SITE_OTHER): Payer: Self-pay | Admitting: Nurse Practitioner

## 2023-10-12 ENCOUNTER — Encounter: Payer: Self-pay | Admitting: Hematology

## 2023-10-17 ENCOUNTER — Ambulatory Visit: Attending: Anatomic and Clinical Pathology

## 2023-10-17 VITALS — Wt 146.8 lb

## 2023-10-17 DIAGNOSIS — C20 Malignant neoplasm of rectum: Secondary | ICD-10-CM | POA: Insufficient documentation

## 2023-10-17 DIAGNOSIS — K56609 Unspecified intestinal obstruction, unspecified as to partial versus complete obstruction: Secondary | ICD-10-CM | POA: Insufficient documentation

## 2023-10-17 LAB — COMPREHENSIVE METABOLIC PANEL
ALT: 15 U/L (ref <=55)
AST (SGOT): 25 U/L (ref <=41)
Albumin/Globulin Ratio: 1.3 (ref 0.9–2.2)
Albumin: 3.8 g/dL (ref 3.5–5.0)
Alkaline Phosphatase: 70 U/L (ref 37–117)
Anion Gap: 9 (ref 5.0–15.0)
BUN: 15 mg/dL (ref 7–21)
Bilirubin, Total: 0.8 mg/dL (ref 0.2–1.2)
CO2: 25 meq/L (ref 17–29)
Calcium: 9.2 mg/dL (ref 8.5–10.5)
Chloride: 108 meq/L (ref 99–111)
Creatinine: 0.9 mg/dL (ref 0.4–1.0)
GFR: >60 mL/min/1.73 m2 (ref >=60.0)
Globulin: 2.9 g/dL (ref 2.0–3.6)
Glucose: 91 mg/dL (ref 70–100)
Hemolysis Index: 8 {index}
Potassium: 3.8 meq/L (ref 3.5–5.3)
Protein, Total: 6.7 g/dL (ref 6.0–8.3)
Sodium: 142 meq/L (ref 135–145)

## 2023-10-17 LAB — LAB USE ONLY - CBC WITH DIFFERENTIAL
Absolute Basophils: 0.04 x10 3/uL (ref 0.00–0.08)
Absolute Eosinophils: 0.13 x10 3/uL (ref 0.00–0.44)
Absolute Immature Granulocytes: 0.01 x10 3/uL (ref 0.00–0.07)
Absolute Lymphocytes: 1.03 x10 3/uL (ref 0.42–3.22)
Absolute Monocytes: 0.42 x10 3/uL (ref 0.21–0.85)
Absolute Neutrophils: 2.94 x10 3/uL (ref 1.10–6.33)
Absolute nRBC: 0 x10 3/uL (ref <=0.00)
Basophils %: 0.9 %
Eosinophils %: 2.8 %
Hematocrit: 34.6 % — ABNORMAL LOW (ref 34.7–43.7)
Hemoglobin: 11.8 g/dL (ref 11.4–14.8)
Immature Granulocytes %: 0.2 %
Lymphocytes %: 22.5 %
MCH: 30.1 pg (ref 25.1–33.5)
MCHC: 34.1 g/dL (ref 31.5–35.8)
MCV: 88.3 fL (ref 78.0–96.0)
MPV: 10.5 fL (ref 8.9–12.5)
Monocytes %: 9.2 %
Neutrophils %: 64.4 %
Platelet Count: 208 x10 3/uL (ref 142–346)
Preliminary Absolute Neutrophil Count: 2.94 x10 3/uL (ref 1.10–6.33)
RBC: 3.92 x10 6/uL (ref 3.90–5.10)
RDW: 13 % (ref 11–15)
WBC: 4.57 x10 3/uL (ref 3.10–9.50)
nRBC %: 0 /100{WBCs} (ref <=0.0)

## 2023-10-17 LAB — CEA: CEA: <1.7 ng/mL (ref 0.0–5.0)

## 2023-10-21 ENCOUNTER — Ambulatory Visit: Admitting: Hematology

## 2023-10-26 ENCOUNTER — Ambulatory Visit: Attending: Hematology | Admitting: Hematology

## 2023-10-26 VITALS — BP 111/71 | HR 67 | Temp 97.6°F | Resp 16 | Wt 146.0 lb

## 2023-10-26 DIAGNOSIS — Z85048 Personal history of other malignant neoplasm of rectum, rectosigmoid junction, and anus: Secondary | ICD-10-CM | POA: Insufficient documentation

## 2023-10-26 DIAGNOSIS — C20 Malignant neoplasm of rectum: Secondary | ICD-10-CM | POA: Insufficient documentation

## 2023-10-26 NOTE — Progress Notes (Signed)
 [facility]  [address]  [place], [place] [address]  P: [phone]  F: [phone]        [facility]  [address] [place], [address]  [place], [place] [address]  P: [phone]  F: [phone]    Gastroenterologist: Dr. Charleen Fairly  Surgeon: Dr. Jerilynn Levels    CHIEF COMPLAINT  Rectal cancer    HISTORY OF PRESENT ILLNESS  Aimee Watson is a 68 y.o. woman with a history of rectal cancer.     1) Rectal cancer:  -01/2020 colonoscopy for rectal bleeding revealed a mass 2cm from the anal verge. Biopsy confirmed adenocarcinoma. There was no e/o metastatic disease by CT. An MRI revealed a T2-T3 cancer with involvement of the internal sphincter and 2 positive mesorectal nodes.   -02/2020 neoadjuvant FOLFOX, followed by chemoRT 07/2020-08/2020.   -LAR with coloanal anastomosis and ypT1a/N0 disease (0/3 nodes), a near CR to therapy. She had MSI-L disease.  -03/2021 ostomy reversal   -postop course c/b bladder prolapse and urinary incontinence, frequent and loose bowel movements.    2) SBO:  -9/10-9/12/23 and 10/27-10/29/23 admitted to [facility] with SBO. The transition point was near the ileostomy reversal site. The SBO resolved with conservative management.      INTERIM HISTORY  Aimee Watson returns to the office for follow up.     It has now been 3 years since completing treatment. She reports stable symptoms of frequent and loose bowel movements which come and go. This is unchanged from prior.    She denies any change in sensory neuropathy.    Her last colonoscopy with Dr. Fairly was on 06/17/22 and she was recommended for 5 year follow up.    I reviewed notes from the PMD, survivorship clinic on the date of service.    Allergies   Allergen Reactions    Penicillins Rash     Current Outpatient Medications   Medication Sig Dispense Refill    ascorbic acid (VITAMIN C) 250 MG tablet Take 1 tablet (250 mg) by mouth      Cholecalciferol (Vitamin D3) 2000 UNIT capsule Take 1  capsule (2,000 Units) by mouth      L-Lysine HCl 500 MG Tab Take by mouth every evening      melatonin 3 mg tablet Take 1 tablet (3 mg) by mouth nightly      Multiple Vitamins-Minerals (Multi Vitamin/Minerals) Tab Take 1 tablet by mouth      omeprazole (PriLOSEC) 20 MG capsule Take 1 capsule (20 mg) by mouth      vitamin B-12 (CYANOCOBALAMIN) 1000 MCG tablet Take 1 tablet (1,000 mcg) by mouth daily Gummies      Wheat Dextrin (BENEFIBER PO) Take by mouth      zinc gluconate 50 MG tablet Take 1 tablet (50 mg) by mouth      diphenhydrAMINE  (BENADRYL ) 25 MG tablet Take 1 tablet (25 mg) by mouth at bedtime as needed for Allergies      promethazine -dextromethorphan (PROMETHAZINE -DM) 6.25-15 MG/5ML syrup Take 5 mLs by mouth 4 (four) times daily as needed for Cough 180 mL 0     No current facility-administered medications for this visit.     Past Medical History:   Diagnosis Date    Rectal adenocarcinoma (CMS/HCC)     Small bowel obstruction (CMS/HCC)      Past Surgical History:   Procedure Laterality Date    COLONOSCOPY, SCREENING N/A 06/17/2022    Procedure: COLONOSCOPY, SCREENING;  Surgeon: Fairly Charleen  N, MD;  Location: [facility];  Service: Gastroenterology;  Laterality: N/A;    ROBOTIC, COLECTOMY, LAR  10/21/2020    TAKEDOWN ILEOSTOMY  03/24/2021     Family History   Problem Relation Name Age of Onset    Rheum arthritis Mother      Diabetes Mother       Social History     Socioeconomic History    Marital status: Widowed   Tobacco Use    Smoking status: Never    Smokeless tobacco: Never   Vaping Use    Vaping status: Never Used   Substance and Sexual Activity    Alcohol use: Yes     Alcohol/week: 0.0 - 1.0 standard drinks of alcohol     Comment: rarely since cancer    Drug use: Never    Sexual activity: Not Currently     Social Drivers of Health     Financial Resource Strain: Low Risk  (10/12/2023)    Overall Financial Resource Strain (CARDIA)     Difficulty of Paying Living Expenses: Not hard at all    Food Insecurity: No Food Insecurity (10/12/2023)    Hunger Vital Sign     Worried About Running Out of Food in the Last Year: Never true     Ran Out of Food in the Last Year: Never true   Transportation Needs: No Transportation Needs (10/12/2023)    PRAPARE - Therapist, art (Medical): No     Lack of Transportation (Non-Medical): No   Physical Activity: Sufficiently Active (10/12/2023)    Exercise Vital Sign     Days of Exercise per Week: 4 days     Minutes of Exercise per Session: 40 min   Stress: No Stress Concern Present (10/12/2023)    [employer] of Occupational Health - Occupational Stress Questionnaire     Feeling of Stress : Only a little   Social Connections: Moderately Isolated (08/04/2022)    Social Connection and Isolation Panel     Frequency of Communication with Friends and Family: More than three times a week     Frequency of Social Gatherings with Friends and Family: More than three times a week     Attends Religious Services: More than 4 times per year     Active Member of [employer] or Organizations: No     Attends Banker Meetings: Never     Marital Status: Widowed   Intimate Partner Violence: Not At Risk (10/12/2023)    Humiliation, Afraid, Rape, and Kick questionnaire     Fear of Current or Ex-Partner: No     Emotionally Abused: No     Physically Abused: No     Sexually Abused: No   Housing Stability: Not At Risk (10/12/2023)    Housing Stability NCSS     Do you have housing?: Yes     Are you worried about losing your housing?: No     Review of Systems    Objective:     Vitals:    10/26/23 0946   BP: 111/71   Pulse: 67   Resp: 16   Temp: 97.6 F (36.4 C)   SpO2: 99%     Physical Exam  Constitutional:       General: She is not in acute distress.     Appearance: Normal appearance.   HENT:      Head: Normocephalic.   Eyes:      General: No scleral  icterus.     Extraocular Movements: Extraocular movements intact.   Cardiovascular:      Rate and Rhythm: Normal rate.    Pulmonary:      Effort: Pulmonary effort is normal. No respiratory distress.   Musculoskeletal:         General: No swelling.   Neurological:      Mental Status: She is alert and oriented to person, place, and time. Mental status is at baseline.         I personally reviewed the data below.    LAB DATA:   Component      Latest Ref Rng 10/17/2023   WBC      3.10 - 9.50 x10 3/uL 4.57    Hemoglobin      11.4 - 14.8 g/dL 88.1    Hematocrit      34.7 - 43.7 % 34.6 (L)    Platelet Count      142 - 346 x10 3/uL 208       Component      Latest Ref Rng 10/17/2023   Glucose      70 - 100 mg/dL 91    BUN      7 - 21 mg/dL 15    Creatinine      0.4 - 1.0 mg/dL 0.9    Sodium      864 - 145 mEq/L 142    Potassium      3.5 - 5.3 mEq/L 3.8    Chloride      99 - 111 mEq/L 108    CO2      17 - 29 mEq/L 25    Calcium      8.5 - 10.5 mg/dL 9.2    Anion Gap      5.0 - 15.0  9.0    EGFR      >=60.0 mL/min/1.73 m2 >60.0    AST      <=41 U/L 25    ALT      <=55 U/L 15    Alkaline Phosphatase      37 - 117 U/L 70    Albumin      3.5 - 5.0 g/dL 3.8    Protein Total      6.0 - 8.3 g/dL 6.7    Globulin      2.0 - 3.6 g/dL 2.9    Albumin/Globulin Ratio      0.9 - 2.2  1.3    Bilirubin Total      0.2 - 1.2 mg/dL 0.8      Component      Latest Ref Rng 10/17/2023   CEA      0.0 - 5.0 ng/mL <1.7        RADIOLOGY DATA:   04/18/23 CT C/A/P  IMPRESSION:    No evidence of residual or recurrent disease.       Assessment & Plan:   Aimee Watson is a 68 year old woman with a history of stage 3 (T3/N1) rectal cancer, s/p total neoadjuvant therapy and LAR. She is 18 months out from completion of therapy.      1) Rectal cancer:  -she had a near CR to treatment with ypT1a/N0 disease (0/3 nodes). She completed treatment in 08/2020.  -she has no evidence of recurrence. Will continue to monitor.  -recommendations per NCCN for H/P every 3-6 months for 2y, then q6mo for a total of 5y. CEA at the same interval. Will follow up every 6 months,  scans annually through 5  years. Next CT in 04/2024, ordered today.  -colonoscopy 06/2022 normal, follow up in 06/2027 with Dr. Charleen Fairly      2) SBO:  -prior SBOs due to adhesions from prior surgery. Plan is to consider LOA only if she has a recurrence of SBO.        She will see me in 6 months.      I spent a total of 30 minutes reviewing the chart, discussing the case with consultants, and discussing the plan of care with the patient on the date of service.     1. Rectal cancer (CMS/HCC)  CT Chest Abdomen Pelvis W IV and PO Contrast      2. History of rectal cancer  CT Chest Abdomen Pelvis W IV and PO Contrast              Orders Placed This Encounter   Procedures    CT Chest Abdomen Pelvis W IV and PO Contrast     Standing Status:   Future     Expected Date:   04/24/2024     Expiration Date:   10/25/2024     Scheduling Instructions:      To schedule your procedure please call a Central Scheduling Number:      [facility] Scheduling [phone]      [facility] Scheduling [phone]     Reason for Exam::   history of rectal cancer     Release to patient:   Immediate     There are no Patient Instructions on file for this visit.    Vernel Ruth, MD

## 2023-12-02 ENCOUNTER — Other Ambulatory Visit (INDEPENDENT_AMBULATORY_CARE_PROVIDER_SITE_OTHER): Payer: Self-pay | Admitting: Internal Medicine

## 2023-12-29 ENCOUNTER — Encounter: Payer: Self-pay | Admitting: Hematology

## 2023-12-30 ENCOUNTER — Encounter (INDEPENDENT_AMBULATORY_CARE_PROVIDER_SITE_OTHER): Payer: Self-pay | Admitting: Internal Medicine

## 2024-01-14 IMAGING — CT CT CHEST-ABD-PELV W/ CM
3 of 5 series · 13 of 36 positions shown, 15 images · IV contrast (OMNIPAQUE)
Comparison: Outside CT January 29, 2020 and MRI January 29, 2020

CLINICAL DATA: History of rectal cancer status post chemotherapy,
radiation and surgery. Monitor. * Tracking Code: BO *

EXAM:
CT CHEST, ABDOMEN, AND PELVIS WITH CONTRAST
TECHNIQUE: Multidetector CT imaging of the chest, abdomen and pelvis was
performed following the standard protocol during bolus
administration of intravenous contrast.

[Series 2: cap with · axial · 0.73mm/px · z∈[-566,-66]mm · 9 of 126 slices shown, 11 images]
[im 13/126  mediastinal]
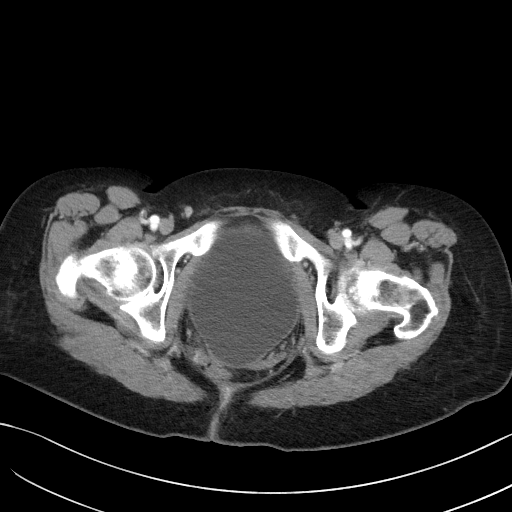
[im 13/126  bone]
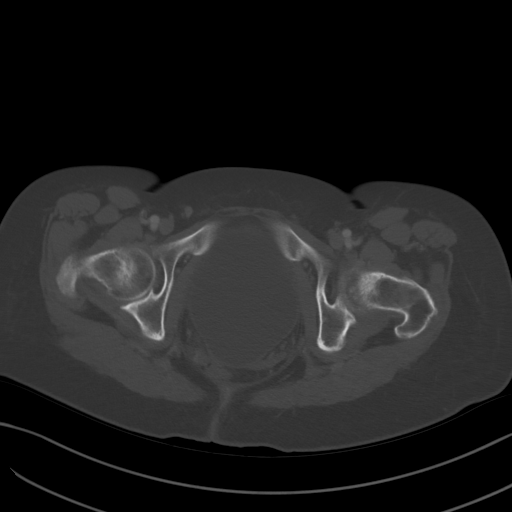
[im 26/126  mediastinal]
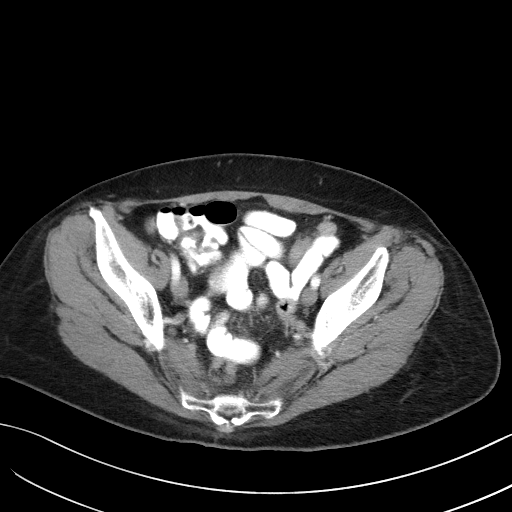
[im 38/126  mediastinal]
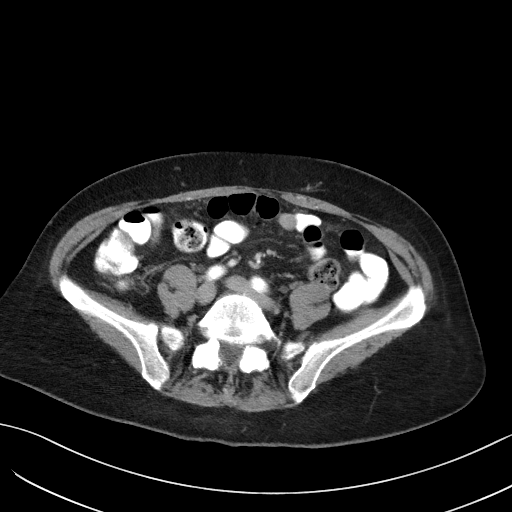
[im 51/126  mediastinal]
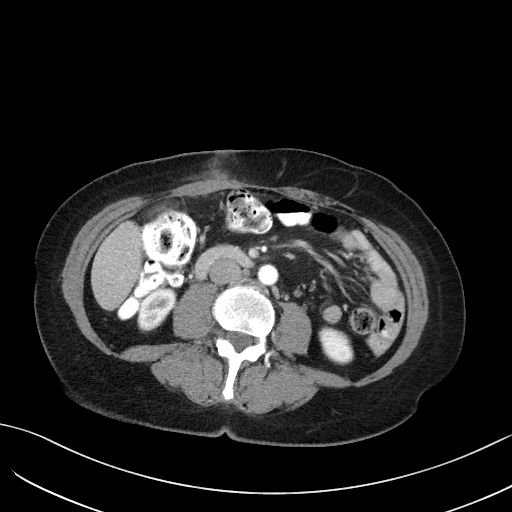
[im 63/126  mediastinal]
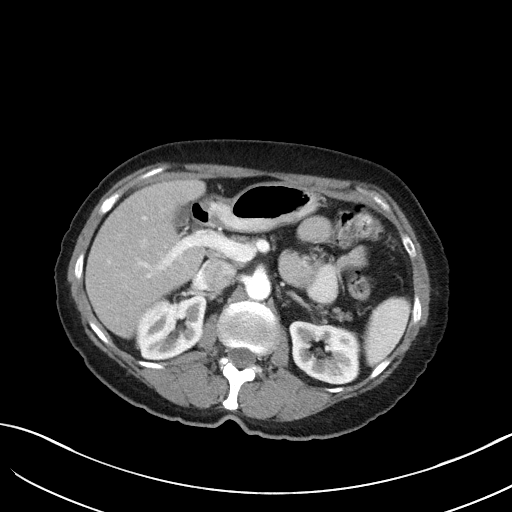
[im 76/126  mediastinal]
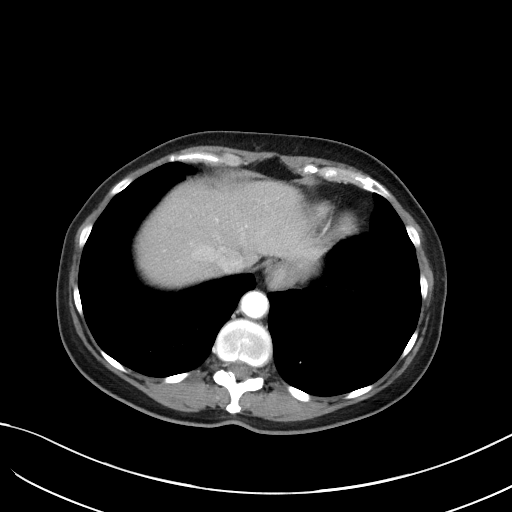
[im 88/126  mediastinal]
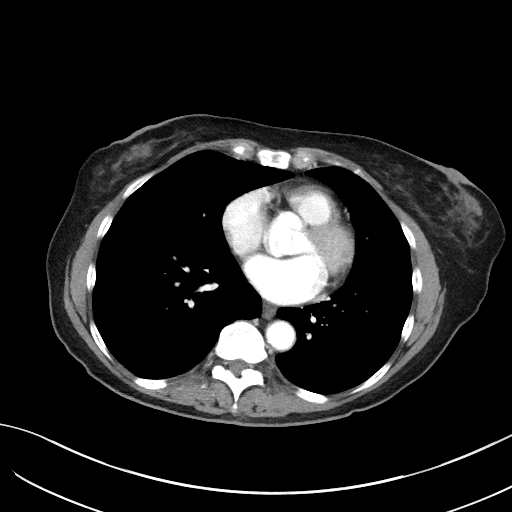
[im 101/126  mediastinal]
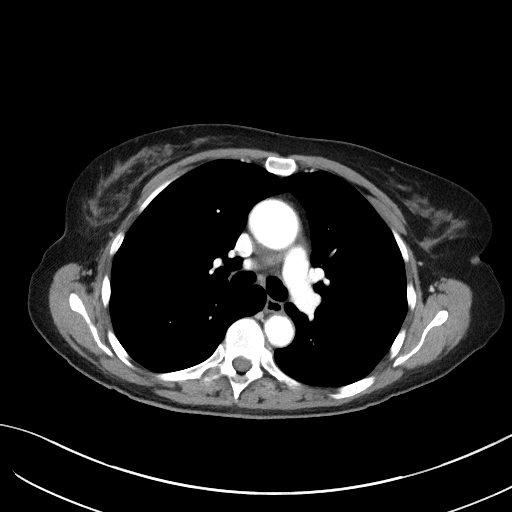
[im 113/126  mediastinal]
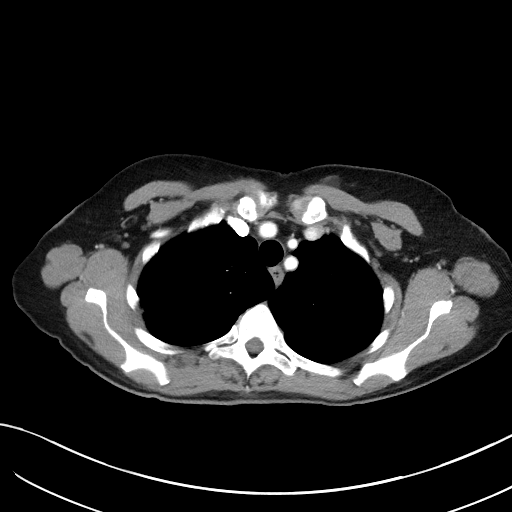
[im 113/126  bone]
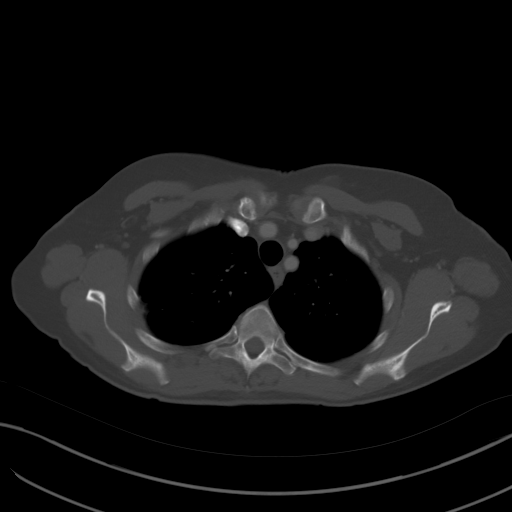

[Series 4: lung · axial · 0.73mm/px · 1 of 153 slices shown]
[im 12/153  bone]
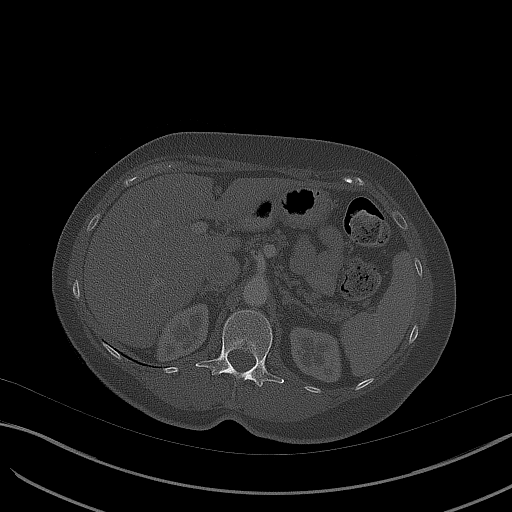

[Series 5: coronals · coronal · 0.83mm/px · 3 of 103 slices shown]
[im 21/103  mediastinal]
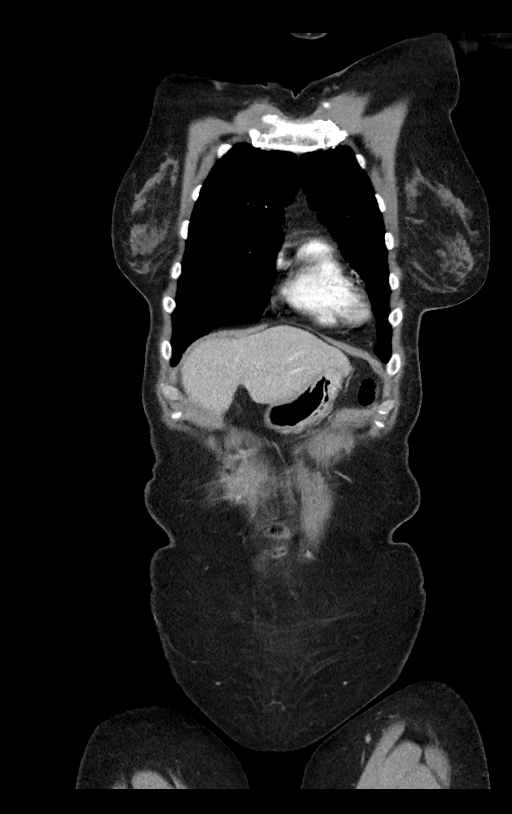
[im 41/103  mediastinal]
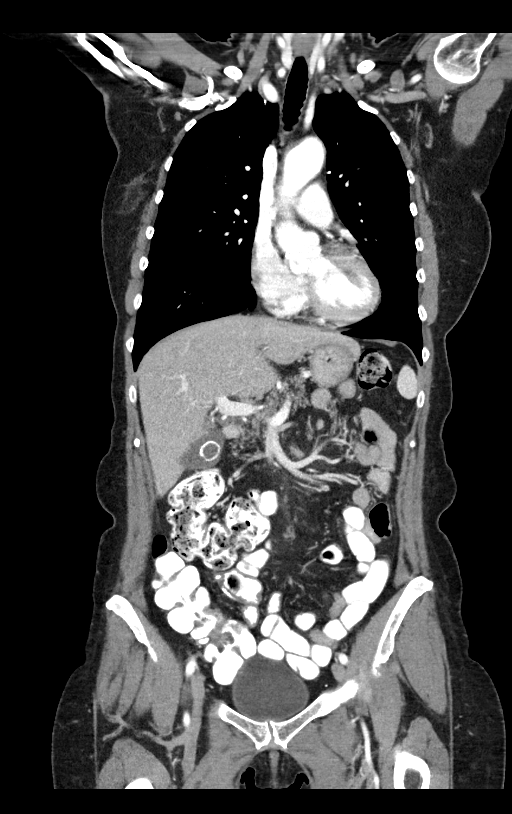
[im 62/103  mediastinal]
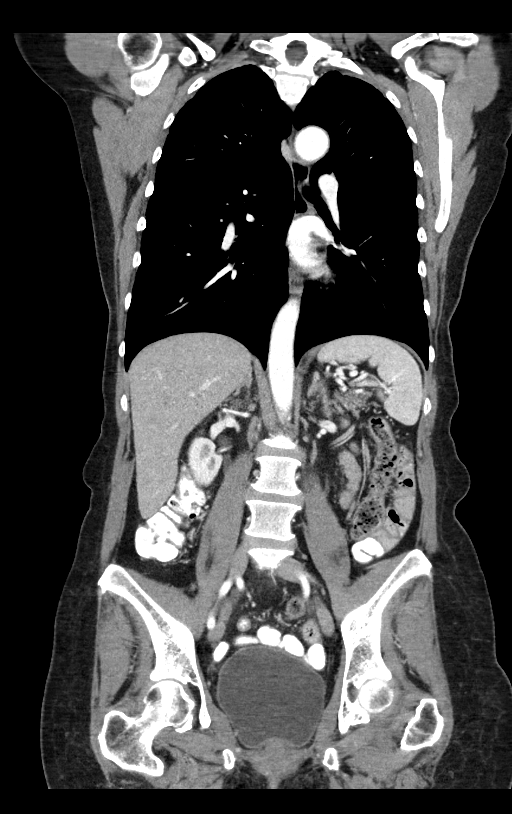

[13 of 36 positions shown; findings below may reference images not displayed]

RADIATION DOSE REDUCTION: This exam was performed according to the
departmental dose-optimization program which includes automated
exposure control, adjustment of the mA and/or kV according to
patient size and/or use of iterative reconstruction technique.

CONTRAST:  100mL OMNIPAQUE IOHEXOL 300 MG/ML  SOLN
FINDINGS: CT CHEST FINDINGS

Cardiovascular: Aortic atherosclerosis without thoracic aortic
aneurysm. No central pulmonary embolus on this nondedicated study.
Normal size heart. No significant pericardial effusion/thickening.

Mediastinum/Nodes: No supraclavicular adenopathy. No suspicious
thyroid nodule. No pathologically enlarged mediastinal, hilar or
axillary lymph nodes.

Lungs/Pleura: Mild biapical pleuroparenchymal scarring. No
suspicious pulmonary nodules or masses. No pleural effusion. No
pneumothorax.

Musculoskeletal: No aggressive lytic or blastic lesion of bone.
Thoracolumbar spondylosis.

CT ABDOMEN PELVIS FINDINGS

Hepatobiliary: No suspicious hepatic lesion. Cholelithiasis without
findings of acute cholecystitis. No biliary ductal dilation.

Pancreas: No pancreatic ductal dilation or evidence of acute
inflammation.

Spleen: No splenomegaly or focal splenic lesion.

Adrenals/Urinary Tract: Bilateral adrenal glands appear normal. No
hydronephrosis. Kidneys demonstrate symmetric enhancement and
excretion of contrast material. Urinary bladder is unremarkable for
degree of distension.

Stomach/Bowel: Radiopaque enteric contrast material traverses the
splenic flexure. Stomach is nondistended limiting evaluation. No
pathologic dilation of small or large bowel. No evidence of acute
bowel inflammation.

Postsurgical change of prior low anterior resection with loop
ileostomy and subsequent reversal with low rectal anastomotic
sutures on image 123/2. There is soft tissue thickening about the
mesorectal fat and along the rectum for instance on image 123/2 and
122/2, favored to reflect postsurgical/post treatment change.

Vascular/Lymphatic: Aortic and branch vessel atherosclerosis without
abdominal aortic aneurysm. No pathologically enlarged abdominal or
pelvic lymph nodes.

Reproductive: Status post hysterectomy. No adnexal masses.

Other: Changes of prior right anterior abdominal wall loop ileostomy
and reversal. No significant abdominopelvic free fluid. No discrete
peritoneal or omental nodularity.

Musculoskeletal: Post radiation change in the sacrum. Multilevel
degenerative changes spine. No aggressive lytic or blastic lesion of
bone. Degenerative changes bilateral hips and SI joints. Chronic
osseous changes along the pubic symphysis.
IMPRESSION: 1. Postsurgical change of prior low anterior resection with loop
ileostomy and subsequent reversal with low rectal anastomotic
sutures. Soft tissue thickening about the mesorectal fat and along
the rectum which is nonspecific but favored to reflect
postsurgical/post treatment change. Attention on follow-up exams
recommended.
2. No evidence of metastatic disease within the chest, abdomen or
pelvis.
3. Cholelithiasis without findings of acute cholecystitis.
4.  Aortic Atherosclerosis ([id number].D).

## 2024-03-08 ENCOUNTER — Encounter (INDEPENDENT_AMBULATORY_CARE_PROVIDER_SITE_OTHER): Payer: Self-pay

## 2024-03-08 ENCOUNTER — Ambulatory Visit (INDEPENDENT_AMBULATORY_CARE_PROVIDER_SITE_OTHER): Admitting: Family

## 2024-03-08 VITALS — BP 124/76 | HR 83 | Temp 97.9°F | Resp 14 | Ht 63.0 in | Wt 147.0 lb

## 2024-03-08 DIAGNOSIS — R051 Acute cough: Secondary | ICD-10-CM

## 2024-03-08 DIAGNOSIS — J019 Acute sinusitis, unspecified: Secondary | ICD-10-CM

## 2024-03-08 DIAGNOSIS — R0982 Postnasal drip: Secondary | ICD-10-CM

## 2024-03-08 DIAGNOSIS — B9689 Other specified bacterial agents as the cause of diseases classified elsewhere: Secondary | ICD-10-CM

## 2024-03-08 LAB — POCT RESPIRATORY SYNCYTIAL VIRUS: POCT Rapid RSV: NEGATIVE

## 2024-03-08 MED ORDER — PSEUDOEPH-BROMPHEN-DM 30-2-10 MG/5ML PO SYRP: 2.5000 mL | ORAL_SOLUTION | Freq: Four times a day (QID) | ORAL | 0 refills | Status: AC | PRN

## 2024-03-08 MED ORDER — ALBUTEROL SULFATE HFA 108 (90 BASE) MCG/ACT IN AERS: 2.0000 | INHALATION_SPRAY | RESPIRATORY_TRACT | 0 refills | Status: AC | PRN

## 2024-03-08 MED ORDER — ERYTHROMYCIN 5 MG/GM OP OINT: TOPICAL_OINTMENT | Freq: Four times a day (QID) | OPHTHALMIC | 0 refills | Status: AC

## 2024-03-08 MED ORDER — DOXYCYCLINE HYCLATE 100 MG PO TABS: 100.0000 mg | ORAL_TABLET | Freq: Two times a day (BID) | ORAL | 0 refills | Status: AC

## 2024-03-08 NOTE — Progress Notes (Signed)
 [facility]  OFFICE NOTE         Subjective   Historian: Patient      Chief Complaint   Patient presents with    Conjunctivitis     Both eyes mostly right eye , hurt x yesterday  Ears popping , congestion , facial pain , cough , nausea x beginning of the month      HPI  Aimee Watson is a 69 y.o. female who presents for sinus pressure, ear pressure and postnasal drip that started approxi-1 month ago.  Patient also reports right eye redness.  Her grandchildren at home have conjunctivitis.  She reports her family at home have been sick the past couple of months.    History:  Medications and Allergies reviewed.   Pertinent Past Medical, Surgical, Family and Social History were reviewed.          Objective     Vitals:    03/08/24 1331   BP: 124/76   BP Site: Left arm   Patient Position: Sitting   Cuff Size: Medium   Pulse: 83   Resp: 14   Temp: 97.9 F (36.6 C)   TempSrc: Tympanic   SpO2: 98%   Weight: 66.7 kg (147 lb)   Height: 1.6 m (5' 3)      Body mass index is 26.04 kg/m.          Physical Exam  Constitutional:       General: She is not in acute distress.     Appearance: She is well-developed.   HENT:      Head: Normocephalic and atraumatic.      Right Ear: Tympanic membrane and external ear normal.      Left Ear: Tympanic membrane and external ear normal.      Mouth/Throat:      Pharynx: No oropharyngeal exudate.     Eyes: Conjunctivae are normal. Right eye exhibits exudate. Right conjunctiva is not injected. Left conjunctiva is not injected. No scleral icterus.     Cardiovascular:      Rate and Rhythm: Normal rate and regular rhythm.      Heart sounds: Normal heart sounds. No murmur heard.  Pulmonary:      Effort: Pulmonary effort is normal. No respiratory distress.      Breath sounds: Normal breath sounds. No wheezing.   Musculoskeletal:      Cervical back: Normal range of motion and neck supple.   Lymphadenopathy:      Cervical: No cervical adenopathy.   Neurological:      Mental Status: She is  alert and oriented to person, place, and time.   Skin:     General: Skin is warm and dry.      Findings: No rash.   Vitals and nursing note reviewed.     Urgent Care Course   There were no labs reviewed with this patient during the visit.    There were no x-rays reviewed with this patient during the visit.      Procedures   Procedures     Assessment / Plan     Differential Diagnoses including but not limited to: Bacterial conjunctivitis, bacterial sinusitis, bronchitis, pneumonia    Treating with doxycycline  for sinus infection lower respiratory infection.    Albuterol  inhaler, 2 puffs every 4-6 hours as needed for wheezing    Advised to take over-the-counter decongestants such as Flonase.  I sent Bromfed to the pharmacy for postnasal drip and congestion    Contact  Devatis being treated with erythromycin  ointment to the right eye    Diagnoses and all orders for this visit:  Acute bacterial sinusitis  PND (post-nasal drip)  Acute cough  -     POCT respiratory syncytial virus; Future  Other orders  -     doxycycline  (VIBRA -TABS) 100 MG tablet; Take 1 tablet (100 mg) by mouth 2 (two) times daily for 7 days  -     albuterol  sulfate HFA (Proventil  HFA) 108 (90 Base) MCG/ACT inhaler; Inhale 2 puffs into the lungs every 4 (four) hours as needed for Wheezing or Shortness of Breath  -     brompheniramine-pseudoephedrine-DM 30-2-10 MG/5ML syrup; Take 2.5 mLs by mouth 4 (four) times daily as needed (as needed for cough and congestion)        The indications for early follow-up with PCP and return to UC were discussed. Patient/family received education on the working diagnosis, diagnostic uncertainties, and proposed treatment plan. Indications for emergency evaluation in the ED were reviewed. Written and verbal discharge instructions were provided and discussed and all questions from the patient/family were addressed, with no apparent barriers.

## 2024-04-24 ENCOUNTER — Ambulatory Visit
